# Patient Record
Sex: Female | Born: 1935 | Race: White | Hispanic: No | Marital: Married | Smoking: Former smoker
Health system: Southern US, Community
[De-identification: ages and names within clinical notes are randomized; demographics above are authoritative.]

## PROBLEM LIST (undated history)

## (undated) DIAGNOSIS — I10 Essential (primary) hypertension: Secondary | ICD-10-CM

## (undated) DIAGNOSIS — R0602 Shortness of breath: Secondary | ICD-10-CM

## (undated) DIAGNOSIS — Z8719 Personal history of other diseases of the digestive system: Secondary | ICD-10-CM

## (undated) DIAGNOSIS — F419 Anxiety disorder, unspecified: Secondary | ICD-10-CM

## (undated) DIAGNOSIS — K219 Gastro-esophageal reflux disease without esophagitis: Secondary | ICD-10-CM

## (undated) DIAGNOSIS — K222 Esophageal obstruction: Secondary | ICD-10-CM

## (undated) DIAGNOSIS — J45909 Unspecified asthma, uncomplicated: Secondary | ICD-10-CM

## (undated) DIAGNOSIS — G43909 Migraine, unspecified, not intractable, without status migrainosus: Secondary | ICD-10-CM

## (undated) DIAGNOSIS — C44511 Basal cell carcinoma of skin of breast: Secondary | ICD-10-CM

## (undated) DIAGNOSIS — M810 Age-related osteoporosis without current pathological fracture: Secondary | ICD-10-CM

## (undated) DIAGNOSIS — J449 Chronic obstructive pulmonary disease, unspecified: Secondary | ICD-10-CM

---

## 1969-06-27 HISTORY — PX: URETHRAL DIVERTICULUM REPAIR: SHX5148

## 1979-06-28 HISTORY — PX: DILATION AND CURETTAGE OF UTERUS: SHX78

## 1979-06-28 HISTORY — PX: ABDOMINAL HYSTERECTOMY: SHX81

## 1989-06-27 HISTORY — PX: CATARACT EXTRACTION W/ INTRAOCULAR LENS  IMPLANT, BILATERAL: SHX1307

## 1998-11-07 ENCOUNTER — Ambulatory Visit (HOSPITAL_COMMUNITY): Admission: RE | Admit: 1998-11-07 | Discharge: 1998-11-07 | Payer: Self-pay | Admitting: Family Medicine

## 2002-03-10 ENCOUNTER — Emergency Department (HOSPITAL_COMMUNITY): Admission: EM | Admit: 2002-03-10 | Discharge: 2002-03-11 | Payer: Self-pay | Admitting: Emergency Medicine

## 2002-03-10 ENCOUNTER — Encounter: Payer: Self-pay | Admitting: Emergency Medicine

## 2002-08-10 ENCOUNTER — Encounter: Payer: Self-pay | Admitting: Emergency Medicine

## 2002-08-10 ENCOUNTER — Emergency Department (HOSPITAL_COMMUNITY): Admission: EM | Admit: 2002-08-10 | Discharge: 2002-08-10 | Payer: Self-pay | Admitting: *Deleted

## 2005-08-26 ENCOUNTER — Encounter: Admission: RE | Admit: 2005-08-26 | Discharge: 2005-08-26 | Payer: Self-pay | Admitting: Family Medicine

## 2005-09-02 ENCOUNTER — Encounter: Admission: RE | Admit: 2005-09-02 | Discharge: 2005-09-02 | Payer: Self-pay | Admitting: Family Medicine

## 2005-12-19 ENCOUNTER — Emergency Department (HOSPITAL_COMMUNITY): Admission: EM | Admit: 2005-12-19 | Discharge: 2005-12-19 | Payer: Self-pay | Admitting: Emergency Medicine

## 2006-12-17 ENCOUNTER — Encounter: Admission: RE | Admit: 2006-12-17 | Discharge: 2006-12-17 | Payer: Self-pay | Admitting: Family Medicine

## 2007-08-07 ENCOUNTER — Emergency Department (HOSPITAL_COMMUNITY): Admission: EM | Admit: 2007-08-07 | Discharge: 2007-08-07 | Payer: Self-pay | Admitting: Emergency Medicine

## 2007-08-08 ENCOUNTER — Emergency Department (HOSPITAL_COMMUNITY): Admission: EM | Admit: 2007-08-08 | Discharge: 2007-08-08 | Payer: Self-pay | Admitting: Emergency Medicine

## 2008-01-12 ENCOUNTER — Encounter: Admission: RE | Admit: 2008-01-12 | Discharge: 2008-01-12 | Payer: Self-pay | Admitting: Family Medicine

## 2009-01-16 ENCOUNTER — Encounter: Admission: RE | Admit: 2009-01-16 | Discharge: 2009-01-16 | Payer: Self-pay | Admitting: Family Medicine

## 2010-03-04 ENCOUNTER — Encounter: Admission: RE | Admit: 2010-03-04 | Discharge: 2010-03-04 | Payer: Self-pay | Admitting: *Deleted

## 2010-10-06 ENCOUNTER — Emergency Department (HOSPITAL_COMMUNITY)
Admission: EM | Admit: 2010-10-06 | Discharge: 2010-10-06 | Payer: Self-pay | Source: Home / Self Care | Admitting: Emergency Medicine

## 2010-10-27 HISTORY — PX: ESOPHAGEAL DILATION: SHX303

## 2010-11-16 ENCOUNTER — Encounter: Payer: Self-pay | Admitting: Family Medicine

## 2011-06-27 ENCOUNTER — Other Ambulatory Visit: Payer: Self-pay | Admitting: Family Medicine

## 2011-07-01 ENCOUNTER — Ambulatory Visit
Admission: RE | Admit: 2011-07-01 | Discharge: 2011-07-01 | Disposition: A | Discharge status: Home / Self Care | Payer: Medicare Other | Source: Ambulatory Visit | Attending: Family Medicine | Admitting: Family Medicine

## 2011-08-01 ENCOUNTER — Other Ambulatory Visit: Payer: Self-pay | Admitting: Family Medicine

## 2011-08-01 DIAGNOSIS — Z1231 Encounter for screening mammogram for malignant neoplasm of breast: Secondary | ICD-10-CM

## 2011-08-07 LAB — CBC
HCT: 37.6
Hemoglobin: 12.8
MCHC: 34
MCV: 88.1
RDW: 13.3

## 2011-08-07 LAB — DIFFERENTIAL
Basophils Relative: 1
Monocytes Relative: 5
Neutrophils Relative %: 71

## 2011-08-12 ENCOUNTER — Ambulatory Visit
Admission: RE | Admit: 2011-08-12 | Discharge: 2011-08-12 | Disposition: A | Discharge status: Home / Self Care | Payer: Medicare Other | Source: Ambulatory Visit | Attending: Family Medicine | Admitting: Family Medicine

## 2011-08-12 DIAGNOSIS — Z1231 Encounter for screening mammogram for malignant neoplasm of breast: Secondary | ICD-10-CM

## 2013-06-06 ENCOUNTER — Ambulatory Visit
Admission: RE | Admit: 2013-06-06 | Discharge: 2013-06-06 | Disposition: A | Discharge status: Home / Self Care | Payer: Medicare Other | Source: Ambulatory Visit | Attending: Family Medicine | Admitting: Family Medicine

## 2013-06-06 ENCOUNTER — Other Ambulatory Visit: Payer: Self-pay | Admitting: Family Medicine

## 2013-06-06 DIAGNOSIS — R0600 Dyspnea, unspecified: Secondary | ICD-10-CM

## 2013-06-07 ENCOUNTER — Inpatient Hospital Stay (HOSPITAL_COMMUNITY)
Admission: EM | Admit: 2013-06-07 | Discharge: 2013-06-09 | DRG: 191 | Disposition: A | Discharge status: Home / Self Care | Payer: Medicare Other | Attending: Internal Medicine | Admitting: Internal Medicine

## 2013-06-07 ENCOUNTER — Encounter (HOSPITAL_COMMUNITY): Payer: Self-pay | Admitting: *Deleted

## 2013-06-07 DIAGNOSIS — T380X5A Adverse effect of glucocorticoids and synthetic analogues, initial encounter: Secondary | ICD-10-CM | POA: Diagnosis present

## 2013-06-07 DIAGNOSIS — J209 Acute bronchitis, unspecified: Secondary | ICD-10-CM | POA: Diagnosis present

## 2013-06-07 DIAGNOSIS — R0602 Shortness of breath: Secondary | ICD-10-CM | POA: Diagnosis present

## 2013-06-07 DIAGNOSIS — Z87891 Personal history of nicotine dependence: Secondary | ICD-10-CM

## 2013-06-07 DIAGNOSIS — J441 Chronic obstructive pulmonary disease with (acute) exacerbation: Principal | ICD-10-CM | POA: Diagnosis present

## 2013-06-07 DIAGNOSIS — J440 Chronic obstructive pulmonary disease with acute lower respiratory infection: Secondary | ICD-10-CM | POA: Diagnosis present

## 2013-06-07 DIAGNOSIS — R7309 Other abnormal glucose: Secondary | ICD-10-CM | POA: Diagnosis present

## 2013-06-07 DIAGNOSIS — J4521 Mild intermittent asthma with (acute) exacerbation: Secondary | ICD-10-CM

## 2013-06-07 DIAGNOSIS — G43909 Migraine, unspecified, not intractable, without status migrainosus: Secondary | ICD-10-CM | POA: Diagnosis present

## 2013-06-07 DIAGNOSIS — K219 Gastro-esophageal reflux disease without esophagitis: Secondary | ICD-10-CM | POA: Diagnosis present

## 2013-06-07 DIAGNOSIS — J45909 Unspecified asthma, uncomplicated: Secondary | ICD-10-CM | POA: Diagnosis present

## 2013-06-07 DIAGNOSIS — I10 Essential (primary) hypertension: Secondary | ICD-10-CM | POA: Diagnosis present

## 2013-06-07 DIAGNOSIS — J45901 Unspecified asthma with (acute) exacerbation: Secondary | ICD-10-CM

## 2013-06-07 HISTORY — DX: Basal cell carcinoma of skin of breast: C44.511

## 2013-06-07 HISTORY — DX: Migraine, unspecified, not intractable, without status migrainosus: G43.909

## 2013-06-07 HISTORY — DX: Unspecified asthma, uncomplicated: J45.909

## 2013-06-07 HISTORY — DX: Gastro-esophageal reflux disease without esophagitis: K21.9

## 2013-06-07 HISTORY — DX: Shortness of breath: R06.02

## 2013-06-07 HISTORY — DX: Essential (primary) hypertension: I10

## 2013-06-07 HISTORY — DX: Esophageal obstruction: K22.2

## 2013-06-07 HISTORY — DX: Personal history of other diseases of the digestive system: Z87.19

## 2013-06-07 LAB — GLUCOSE, CAPILLARY
Glucose-Capillary: 218 mg/dL — ABNORMAL HIGH (ref 70–99)
Glucose-Capillary: 183 mg/dL — ABNORMAL HIGH (ref 70–99)
Glucose-Capillary: 121 mg/dL — ABNORMAL HIGH (ref 70–99)

## 2013-06-07 LAB — BASIC METABOLIC PANEL
Calcium: 9.3 mg/dL (ref 8.4–10.5)
Chloride: 101 mEq/L (ref 96–112)
Creatinine, Ser: 0.79 mg/dL (ref 0.50–1.10)
GFR calc Af Amer: >90 mL/min (ref >90)
GFR calc non Af Amer: 79 mL/min — ABNORMAL LOW (ref >90)
Potassium: 3.1 mEq/L — ABNORMAL LOW (ref 3.5–5.1)
Sodium: 141 mEq/L (ref 135–145)

## 2013-06-07 LAB — CBC
HCT: 38 % (ref 36.0–46.0)
Hemoglobin: 13.1 g/dL (ref 12.0–15.0)
MCH: 30.4 pg (ref 26.0–34.0)
MCV: 88.2 fL (ref 78.0–100.0)
RBC: 4.31 MIL/uL (ref 3.87–5.11)

## 2013-06-07 LAB — CBC WITH DIFFERENTIAL/PLATELET
Basophils Absolute: 0 10*3/uL (ref 0.0–0.1)
Basophils Relative: 0 % (ref 0–1)
Hemoglobin: 13.5 g/dL (ref 12.0–15.0)
Lymphocytes Relative: 5 % — ABNORMAL LOW (ref 12–46)
Lymphs Abs: 0.4 10*3/uL — ABNORMAL LOW (ref 0.7–4.0)
MCHC: 34.3 g/dL (ref 30.0–36.0)
MCV: 88.7 fL (ref 78.0–100.0)
Monocytes Relative: 1 % — ABNORMAL LOW (ref 3–12)
Neutro Abs: 7.5 10*3/uL (ref 1.7–7.7)
Neutrophils Relative %: 94 % — ABNORMAL HIGH (ref 43–77)
Platelets: 179 10*3/uL (ref 150–400)
RDW: 13.7 % (ref 11.5–15.5)

## 2013-06-07 LAB — HEMOGLOBIN A1C
Hgb A1c MFr Bld: 5.7 % — ABNORMAL HIGH (ref <5.7)
Mean Plasma Glucose: 117 mg/dL — ABNORMAL HIGH (ref <117)

## 2013-06-07 LAB — POCT I-STAT TROPONIN I

## 2013-06-07 MED ORDER — ALBUTEROL SULFATE (5 MG/ML) 0.5% IN NEBU
5.0000 mg | INHALATION_SOLUTION | Freq: Once | RESPIRATORY_TRACT | Status: AC
  Administered 2013-06-07: 5 mg via RESPIRATORY_TRACT
  Filled 2013-06-07: qty 1

## 2013-06-07 MED ORDER — METHYLPREDNISOLONE SODIUM SUCC 40 MG IJ SOLR
40.0000 mg | INTRAMUSCULAR | Status: DC
  Administered 2013-06-07: 40 mg via INTRAVENOUS
  Filled 2013-06-07 (×3): qty 1

## 2013-06-07 MED ORDER — INSULIN ASPART 100 UNIT/ML ~~LOC~~ SOLN
0.0000 [IU] | Freq: Three times a day (TID) | SUBCUTANEOUS | Status: DC
  Administered 2013-06-07: 1 [IU] via SUBCUTANEOUS
  Administered 2013-06-07: 2 [IU] via SUBCUTANEOUS
  Administered 2013-06-09: 1 [IU] via SUBCUTANEOUS
  Administered 2013-06-09: 2 [IU] via SUBCUTANEOUS

## 2013-06-07 MED ORDER — ALBUTEROL SULFATE (5 MG/ML) 0.5% IN NEBU
2.5000 mg | INHALATION_SOLUTION | Freq: Four times a day (QID) | RESPIRATORY_TRACT | Status: DC
  Administered 2013-06-07 – 2013-06-09 (×8): 2.5 mg via RESPIRATORY_TRACT
  Filled 2013-06-07 (×8): qty 0.5

## 2013-06-07 MED ORDER — ONDANSETRON HCL 4 MG PO TABS: 4.0000 mg | ORAL_TABLET | Freq: Four times a day (QID) | ORAL | Status: DC | PRN

## 2013-06-07 MED ORDER — ACETAMINOPHEN 650 MG RE SUPP: 650.0000 mg | Freq: Four times a day (QID) | RECTAL | Status: DC | PRN

## 2013-06-07 MED ORDER — SODIUM CHLORIDE 0.9 % IJ SOLN
3.0000 mL | Freq: Two times a day (BID) | INTRAMUSCULAR | Status: DC
  Administered 2013-06-07 – 2013-06-09 (×4): 3 mL via INTRAVENOUS

## 2013-06-07 MED ORDER — ACETAMINOPHEN 325 MG PO TABS: 650.0000 mg | ORAL_TABLET | Freq: Four times a day (QID) | ORAL | Status: DC | PRN

## 2013-06-07 MED ORDER — METOPROLOL TARTRATE 50 MG PO TABS
50.0000 mg | ORAL_TABLET | Freq: Every day | ORAL | Status: DC
  Administered 2013-06-07 – 2013-06-09 (×3): 50 mg via ORAL
  Filled 2013-06-07 (×3): qty 1

## 2013-06-07 MED ORDER — HYDROCHLOROTHIAZIDE 25 MG PO TABS
25.0000 mg | ORAL_TABLET | Freq: Every day | ORAL | Status: DC
  Administered 2013-06-07 – 2013-06-09 (×3): 25 mg via ORAL
  Filled 2013-06-07 (×3): qty 1

## 2013-06-07 MED ORDER — ALBUTEROL SULFATE (5 MG/ML) 0.5% IN NEBU
2.5000 mg | INHALATION_SOLUTION | RESPIRATORY_TRACT | Status: DC | PRN
  Administered 2013-06-07: 2.5 mg via RESPIRATORY_TRACT
  Filled 2013-06-07: qty 0.5

## 2013-06-07 MED ORDER — BUDESONIDE 0.25 MG/2ML IN SUSP
0.2500 mg | Freq: Two times a day (BID) | RESPIRATORY_TRACT | Status: DC
  Administered 2013-06-07 – 2013-06-09 (×4): 0.25 mg via RESPIRATORY_TRACT
  Filled 2013-06-07 (×7): qty 2

## 2013-06-07 MED ORDER — AZITHROMYCIN 250 MG PO TABS
500.0000 mg | ORAL_TABLET | Freq: Once | ORAL | Status: AC
  Administered 2013-06-07: 500 mg via ORAL
  Filled 2013-06-07: qty 2

## 2013-06-07 MED ORDER — LEVOFLOXACIN IN D5W 750 MG/150ML IV SOLN
750.0000 mg | INTRAVENOUS | Status: DC
  Administered 2013-06-07 – 2013-06-08 (×3): 750 mg via INTRAVENOUS
  Filled 2013-06-07 (×3): qty 150

## 2013-06-07 MED ORDER — ONDANSETRON HCL 4 MG/2ML IJ SOLN: 4.0000 mg | Freq: Four times a day (QID) | INTRAMUSCULAR | Status: DC | PRN

## 2013-06-07 MED ORDER — ENOXAPARIN SODIUM 40 MG/0.4ML ~~LOC~~ SOLN
40.0000 mg | SUBCUTANEOUS | Status: DC
  Administered 2013-06-07 – 2013-06-09 (×3): 40 mg via SUBCUTANEOUS
  Filled 2013-06-07 (×4): qty 0.4

## 2013-06-07 MED ORDER — MAGNESIUM SULFATE 40 MG/ML IJ SOLN
2.0000 g | Freq: Once | INTRAMUSCULAR | Status: AC
  Administered 2013-06-07: 2 g via INTRAVENOUS
  Filled 2013-06-07: qty 50

## 2013-06-07 MED ORDER — SODIUM CHLORIDE 0.9 % IJ SOLN: 3.0000 mL | Freq: Two times a day (BID) | INTRAMUSCULAR | Status: DC

## 2013-06-07 NOTE — Progress Notes (Signed)
Utilization Review Completed.   Emaree Chiu, RN, BSN Nurse Case Manager  [phone]  

## 2013-06-07 NOTE — ED Notes (Signed)
Pt noted to have O2 saturation of 87% on RA.  Pt place don 2lpm O2 via [place].

## 2013-06-07 NOTE — ED Notes (Addendum)
Per EMS, pt called EMS around 2000 tonight and received one albuterol treatment and refused transport.  Pt called a second time and received one duoned, 125 solumedrol.  Ems observed room o2 sat of 79%.

## 2013-06-07 NOTE — ED Notes (Signed)
Pt placed on room air and O2 saturation noted to have dropped to 87%.  Pt placed back on O2 via [place] at 3lpm

## 2013-06-07 NOTE — Progress Notes (Signed)
ANTIBIOTIC CONSULT NOTE - INITIAL  Pharmacy Consult for levaquin Indication: pneumonia  Allergies  Allergen Reactions  . Spiriva (Tiotropium Bromide Monohydrate) Itching    Patient Measurements: Height: 5\' 7"  (170.2 cm) Weight: 189 lb 9.5 oz (86 kg) (scale C) IBW/kg (Calculated) : 61.6   Vital Signs: Temp: 97.7 F (36.5 C) (08/12 0718) Temp src: Oral (08/12 0718) BP: 121/53 mmHg (08/12 0718) Pulse Rate: 94 (08/12 0718) Intake/Output from previous day:   Intake/Output from this shift:    Labs:  Recent Labs  06/07/13 0324  WBC 8.0  HGB 13.5  PLT 179  CREATININE 0.79   Estimated Creatinine Clearance: 68.5 ml/min (by C-G formula based on Cr of 0.79). No results found for this basename: VANCOTROUGH, VANCOPEAK, VANCORANDOM, GENTTROUGH, GENTPEAK, GENTRANDOM, TOBRATROUGH, TOBRAPEAK, TOBRARND, AMIKACINPEAK, AMIKACINTROU, AMIKACIN,  in the last 72 hours   Microbiology: No results found for this or any previous visit (from the past 720 hour(s)).  Medical History: Past Medical History  Diagnosis Date  . Hypertension   . Asthma     Medications:  Prescriptions prior to admission  Medication Sig Dispense Refill  . doxycycline (VIBRA-TABS) 100 MG tablet Take 100 mg by mouth daily. For 10 days. Start 8.11.14 END 8.21.14      . hydrochlorothiazide (HYDRODIURIL) 25 MG tablet Take 25 mg by mouth daily.      . metoprolol (LOPRESSOR) 50 MG tablet Take 50 mg by mouth daily.      . predniSONE (DELTASONE) 20 MG tablet Take 20 mg by mouth daily. For 7 days. START 8.11.14 END 8.18.14       Assessment: 77 yo lady to start levaquin for COPD exacerbation/r/o PNA.  Her CrCl ~ 68 ml/min.  WBC 8.0  Goal of Therapy:  Eradication of infection  Plan:  Levaquin 750 mg IV q24 hours. F/u renal function, clinical course and cultures.  Child Campoy Poteet 06/07/2013,7:41 AM

## 2013-06-07 NOTE — ED Provider Notes (Signed)
CSN: [id number]     Arrival date & time 06/07/13  0139 History     First MD Initiated Contact with Patient 06/07/13 0231     Chief Complaint  Patient presents with  . Shortness of Breath   HPI patient has history of asthma, she is a remote smoking history, and a few days of worsening shortness of breath, start her primary care physician today received prednisone and doxycycline. She's taken one dose of the prednisone. Per EMS, she called 911 at 8:00, that an albuterol treatment felt better and refused transport. EMS was called a second time, he had room air sats of 79% she received 125 of Solu-Medrol and a duo neb. She says her shortness of breath is severe, is been worsening, no fevers or chills, productive cough. No associated chest pain, nausea vomiting or diarrhea  Past Medical History  Diagnosis Date  . Hypertension   . Asthma    Past Surgical History  Procedure Laterality Date  . Abdominal hysterectomy     Family History  Problem Relation Age of Onset  . Cancer Mother   . Stroke Mother   . Hypertension Mother    History  Substance Use Topics  . Smoking status: Former Smoker    Quit date: 10/27/1968  . Smokeless tobacco: Never Used  . Alcohol Use: 0.6 oz/week    1 Glasses of wine per week   OB History   Grav Para Term Preterm Abortions TAB SAB Ect Mult Living   2 2 2             Review of Systems  Allergies  Spiriva  Home Medications   Current Outpatient Rx  Name  Route  Sig  Dispense  Refill  . doxycycline (VIBRA-TABS) 100 MG tablet   Oral   Take 100 mg by mouth daily. For 10 days. Start 8.11.14 END 8.21.14         . hydrochlorothiazide (HYDRODIURIL) 25 MG tablet   Oral   Take 25 mg by mouth daily.         . metoprolol (LOPRESSOR) 50 MG tablet   Oral   Take 50 mg by mouth daily.         . predniSONE (DELTASONE) 20 MG tablet   Oral   Take 20 mg by mouth daily. For 7 days. START 8.11.14 END 8.18.14          BP 136/70  Pulse 104  SpO2  92% Physical Exam  Nursing notes reviewed.  Electronic medical record reviewed. VITAL SIGNS:   Filed Vitals:   06/07/13 0300 06/07/13 0326 06/07/13 0330 06/07/13 0345  BP: 128/78  138/76 136/70  Pulse: 91  92 104  SpO2: 92% 90% 91% 92%   CONSTITUTIONAL: Awake, oriented, appears non-toxic HENT: Atraumatic, normocephalic, oral mucosa pink and moist, airway patent. Nares patent without drainage. External ears normal. EYES: Conjunctiva clear, EOMI, PERRLA NECK: Trachea midline, non-tender, supple CARDIOVASCULAR: Normal heart rate, Normal rhythm, No murmurs, rubs, gallops PULMONARY/CHEST: Poor air movement bilaterally, wheezing bilaterally. Non-tender. ABDOMINAL: Non-distended, soft, non-tender - no rebound or guarding.  BS normal. NEUROLOGIC: Non-focal, moving all four extremities, no gross sensory or motor deficits. EXTREMITIES: No clubbing, cyanosis, or edema SKIN: Warm, Dry, No erythema, No rash  ED Course   Procedures (including critical care time)  Labs Reviewed  CBC WITH DIFFERENTIAL - Abnormal; Notable for the following:    Neutrophils Relative % 94 (*)    Lymphocytes Relative 5 (*)    Lymphs Abs 0.4 (*)  Monocytes Relative 1 (*)    All other components within normal limits  BASIC METABOLIC PANEL - Abnormal; Notable for the following:    Potassium 3.1 (*)    Glucose, Bld 225 (*)    GFR calc non Af Amer 79 (*)    All other components within normal limits  POCT I-STAT TROPONIN I   Dg Chest 2 View  06/06/2013   *RADIOLOGY REPORT*  Clinical Data: Cough and shortness of breath  CHEST - 2 VIEW  Comparison: 01/11/2012  Findings: The cardiac silhouette is normal in size and configuration.  The mediastinum is normal in contour caliber. There are no hilar masses.  The lungs are clear.  No pleural effusion or pneumothorax.  The bony thorax is demineralized but intact.  IMPRESSION: No acute cardiopulmonary disease.  No change from the prior study.   Original Report Authenticated By:  Amie Portland, M.D.   1. COPD exacerbation   2. HTN (hypertension)   3. SOB (shortness of breath)   4. Asthmatic bronchitis, mild intermittent, with acute exacerbation     MDM  Acadia Verhagen is a 77 y.o. female presenting with likely COPD exacerbation/asthma exacerbation. She does have remote smoking history.  Patient treated with multiple rounds of albuterol, patient failed to respond enough to go home. Patient's room air oxygen saturation is 87% when she is not moving.  Patient treated with albuterol, she gets there and the EMS and given magnesium, she is improved I will require admission. Discussed with Dr. Toniann Fail for admission admitted in stable condition   Jones Skene, MD 06/07/13 [phone]

## 2013-06-07 NOTE — H&P (Signed)
[facility] History and Physical  Kelly Delgado WUJ:[id number] DOB: 1936/02/21 DOA: 06/07/2013  Referring physician: ER physician. PCP: Irving Copas, MD   Chief Complaint: Shortness of breath.  HPI: Kelly Delgado is a 77 y.o. female with history of hypertension was on her way back from [place] on Saturday 2 days ago when patient started developing cough. Eventually patient started developing wheezing and yesterday had gone to her PCP who had given patient doxycycline and steroids. Despite taking which patient was still feeling short of breath and had presented to the ER. In the ER on exam patient was found to be wheezing and chest x-ray does not show any definite infiltrates. Patient has been admitted for further management. Patient denies any chest pain nausea vomiting palpitations diaphoresis abdominal pain diarrhea.  Review of Systems: As presented in the history of presenting illness, rest negative.  Past Medical History  Diagnosis Date  . Hypertension   . Asthma    Past Surgical History  Procedure Laterality Date  . Abdominal hysterectomy     Social History:  reports that she quit smoking about 44 years ago. She has never used smokeless tobacco. She reports that she drinks about 0.6 ounces of alcohol per week. She reports that she does not use illicit drugs. Home. where does patient live-- Can do ADLs. Can patient participate in ADLs?  Allergies  Allergen Reactions  . Spiriva (Tiotropium Bromide Monohydrate) Itching    Family History  Problem Relation Age of Onset  . Cancer Mother   . Stroke Mother   . Hypertension Mother       Prior to Admission medications   Medication Sig Start Date End Date Taking? Authorizing Provider  doxycycline (VIBRA-TABS) 100 MG tablet Take 100 mg by mouth daily. For 10 days. Start 8.11.14 END 8.21.14   Yes Historical Provider, MD  hydrochlorothiazide (HYDRODIURIL) 25 MG tablet Take 25 mg by mouth daily.   Yes Historical  Provider, MD  metoprolol (LOPRESSOR) 50 MG tablet Take 50 mg by mouth daily.   Yes Historical Provider, MD  predniSONE (DELTASONE) 20 MG tablet Take 20 mg by mouth daily. For 7 days. START 8.11.14 END 8.18.14   Yes Historical Provider, MD   Physical Exam: Filed Vitals:   06/07/13 0300 06/07/13 0326 06/07/13 0330 06/07/13 0345  BP: 128/78  138/76 136/70  Pulse: 91  92 104  SpO2: 92% 90% 91% 92%     General:  Well-developed and nourished.  Eyes: Anicteric no pallor.  ENT: No discharge from the ears eyes nose mouth.  Neck: No mass felt.  Cardiovascular: S1-S2 heard.  Respiratory: Bilateral expiratory wheeze heard no crepitations.  Abdomen: Soft nontender bowel sounds present.  Skin: No rash.  Musculoskeletal: No edema.  Psychiatric: Appears normal.  Neurologic: Alert awake oriented to time place and person. Moves all extremities.  Labs on Admission:  Basic Metabolic Panel:  Recent Labs Lab 06/07/13 0324  NA 141  K 3.1*  CL 101  CO2 25  GLUCOSE 225*  BUN 20  CREATININE 0.79  CALCIUM 9.3   Liver Function Tests: No results found for this basename: AST, ALT, ALKPHOS, BILITOT, PROT, ALBUMIN,  in the last 168 hours No results found for this basename: LIPASE, AMYLASE,  in the last 168 hours No results found for this basename: AMMONIA,  in the last 168 hours CBC:  Recent Labs Lab 06/07/13 0324  WBC 8.0  NEUTROABS 7.5  HGB 13.5  HCT 39.4  MCV 88.7  PLT 179   Cardiac Enzymes:  No results found for this basename: CKTOTAL, CKMB, CKMBINDEX, TROPONINI,  in the last 168 hours  BNP (last 3 results) No results found for this basename: PROBNP,  in the last 8760 hours CBG: No results found for this basename: GLUCAP,  in the last 168 hours  Radiological Exams on Admission: Dg Chest 2 View  06/06/2013   *RADIOLOGY REPORT*  Clinical Data: Cough and shortness of breath  CHEST - 2 VIEW  Comparison: 01/11/2012  Findings: The cardiac silhouette is normal in size and  configuration.  The mediastinum is normal in contour caliber. There are no hilar masses.  The lungs are clear.  No pleural effusion or pneumothorax.  The bony thorax is demineralized but intact.  IMPRESSION: No acute cardiopulmonary disease.  No change from the prior study.   Original Report Authenticated By: Amie Portland, M.D.     Assessment/Plan Principal Problem:   SOB (shortness of breath) Active Problems:   Asthmatic bronchitis   HTN (hypertension)   1. Shortness of breath most likely secondary to asthmatic bronchitis - continue with nebulizer Pulmicort IV steroids and antibiotics. 2. Hypoglycemia - probably secondary to steroids. Place patient on CBG checks with sliding scale coverage and check hemoglobin A1c. 3. Hypertension - continue present medications.    Code Status: Full code.  Family Communication: Patient's husband at the bedside.  Disposition Plan: Admit to inpatient.    KAKRAKANDY,ARSHAD N. [facility] Pager [phone].  If 7PM-7AM, please contact night-coverage [email or web address] Password [facility] 06/07/2013, 6:13 AM

## 2013-06-07 NOTE — ED Notes (Signed)
Ambulated pt in hallway. O2 saturation dropped to 87%

## 2013-06-07 NOTE — ED Notes (Addendum)
Pt arrived via EMS due to SOB. Pt states that her dr was checking to see if she had pneumonia and had a chest xray done yesterday (06/06/13)

## 2013-06-07 NOTE — Progress Notes (Signed)
Pt was seen and examined.  H&P by Dr. Toniann Fail was reviewed.  Orders reviewed.  Continue current orders.  Rodney Langton, MD, CDE, FAAFP [facility] [place], [place]

## 2013-06-08 DIAGNOSIS — J45909 Unspecified asthma, uncomplicated: Secondary | ICD-10-CM

## 2013-06-08 LAB — BASIC METABOLIC PANEL
BUN: 25 mg/dL — ABNORMAL HIGH (ref 6–23)
Calcium: 9.2 mg/dL (ref 8.4–10.5)
Creatinine, Ser: 0.86 mg/dL (ref 0.50–1.10)
GFR calc Af Amer: 75 mL/min — ABNORMAL LOW (ref >90)
GFR calc non Af Amer: 64 mL/min — ABNORMAL LOW (ref >90)
Glucose, Bld: 113 mg/dL — ABNORMAL HIGH (ref 70–99)

## 2013-06-08 LAB — CBC
Hemoglobin: 12.5 g/dL (ref 12.0–15.0)
MCH: 30.3 pg (ref 26.0–34.0)
MCHC: 34.2 g/dL (ref 30.0–36.0)
RDW: 14 % (ref 11.5–15.5)

## 2013-06-08 LAB — GLUCOSE, CAPILLARY: Glucose-Capillary: 82 mg/dL (ref 70–99)

## 2013-06-08 MED ORDER — METHYLPREDNISOLONE SODIUM SUCC 40 MG IJ SOLR
40.0000 mg | Freq: Three times a day (TID) | INTRAMUSCULAR | Status: DC
  Administered 2013-06-08 – 2013-06-09 (×2): 40 mg via INTRAVENOUS
  Filled 2013-06-08 (×7): qty 1

## 2013-06-08 NOTE — Progress Notes (Signed)
Patient A/Ox4 and is ambulatory with standby assist. She is currently on 2 L [place] of oxygen. She has no c/o pain and no signs of distress. She is resting in bed.

## 2013-06-08 NOTE — Progress Notes (Signed)
Patient given incentive spirometer to improve lung function and instructed to do one set of 10 per hour.  Pt demonstrated and understands use.

## 2013-06-08 NOTE — Progress Notes (Signed)
[facility] Progress Note  06/08/2013   Subjective: Pt reports that she is improving overall but still coughing and wheezing and SOB with ambulation.  No chest pain.  Pt still on 2liters of oxygen [place].   Objective:  Vital signs in last 24 hours: Filed Vitals:   06/07/13 2043 06/07/13 2059 06/08/13 0217 06/08/13 0451  BP: 146/70   143/75  Pulse: 72   62  Temp: 97.6 F (36.4 C)   97.7 F (36.5 C)  TempSrc: Oral   Oral  Resp: 18   18  Height:      Weight:    85.6 kg (188 lb 11.4 oz)  SpO2: 97% 97% 97% 94%   Weight change:   Intake/Output Summary (Last 24 hours) at 06/08/13 0865 Last data filed at 06/07/13 2135  Gross per 24 hour  Intake    783 ml  Output   1200 ml  Net   -417 ml   Lab Results  Component Value Date   HGBA1C 5.7* 06/07/2013   Lab Results  Component Value Date   CREATININE 0.86 06/08/2013    Review of Systems As above, otherwise all reviewed and reported negative  Physical Exam General - awake, no distress, cooperative HEENT - NCAT, MMM Lungs - BBS with diffuse insp/exp wheezing CV - normal s1, s2 sounds Abd - soft, nondistended, no masses, nontender Ext - no C/C/E  Lab Results: Results for orders placed during the hospital encounter of 06/07/13 (from the past 24 hour(s))  CBC     Status: None   Collection Time    06/07/13  7:55 AM      Result Value Range   WBC 8.2  4.0 - 10.5 K/uL   RBC 4.31  3.87 - 5.11 MIL/uL   Hemoglobin 13.1  12.0 - 15.0 g/dL   HCT 78.4  69.6 - 29.5 %   MCV 88.2  78.0 - 100.0 fL   MCH 30.4  26.0 - 34.0 pg   MCHC 34.5  30.0 - 36.0 g/dL   RDW 28.4  13.2 - 44.0 %   Platelets 190  150 - 400 K/uL  CREATININE, SERUM     Status: Abnormal   Collection Time    06/07/13  7:55 AM      Result Value Range   Creatinine, Ser 0.77  0.50 - 1.10 mg/dL   GFR calc non Af Amer 80 (*) >90 mL/min   GFR calc Af Amer >90  >90 mL/min  HEMOGLOBIN A1C     Status: Abnormal   Collection Time    06/07/13  7:55 AM      Result Value  Range   Hemoglobin A1C 5.7 (*) <5.7 %   Mean Plasma Glucose 117 (*) <117 mg/dL  GLUCOSE, CAPILLARY     Status: Abnormal   Collection Time    06/07/13  8:26 AM      Result Value Range   Glucose-Capillary 218 (*) 70 - 99 mg/dL   Comment 1 Notify RN    GLUCOSE, CAPILLARY     Status: Abnormal   Collection Time    06/07/13 11:10 AM      Result Value Range   Glucose-Capillary 183 (*) 70 - 99 mg/dL   Comment 1 Notify RN    GLUCOSE, CAPILLARY     Status: Abnormal   Collection Time    06/07/13  4:03 PM      Result Value Range   Glucose-Capillary 123 (*) 70 - 99 mg/dL  GLUCOSE, CAPILLARY  Status: Abnormal   Collection Time    06/07/13  9:00 PM      Result Value Range   Glucose-Capillary 121 (*) 70 - 99 mg/dL   Comment 1 Notify RN    BASIC METABOLIC PANEL     Status: Abnormal   Collection Time    06/08/13  4:00 AM      Result Value Range   Sodium 142  135 - 145 mEq/L   Potassium 3.4 (*) 3.5 - 5.1 mEq/L   Chloride 104  96 - 112 mEq/L   CO2 30  19 - 32 mEq/L   Glucose, Bld 113 (*) 70 - 99 mg/dL   BUN 25 (*) 6 - 23 mg/dL   Creatinine, Ser 4.09  0.50 - 1.10 mg/dL   Calcium 9.2  8.4 - 81.1 mg/dL   GFR calc non Af Amer 64 (*) >90 mL/min   GFR calc Af Amer 75 (*) >90 mL/min  CBC     Status: Abnormal   Collection Time    06/08/13  4:00 AM      Result Value Range   WBC 13.8 (*) 4.0 - 10.5 K/uL   RBC 4.12  3.87 - 5.11 MIL/uL   Hemoglobin 12.5  12.0 - 15.0 g/dL   HCT 91.4  78.2 - 95.6 %   MCV 88.8  78.0 - 100.0 fL   MCH 30.3  26.0 - 34.0 pg   MCHC 34.2  30.0 - 36.0 g/dL   RDW 21.3  08.6 - 57.8 %   Platelets 199  150 - 400 K/uL  GLUCOSE, CAPILLARY     Status: None   Collection Time    06/08/13  6:22 AM      Result Value Range   Glucose-Capillary 94  70 - 99 mg/dL   Comment 1 Notify RN      Micro Results: No results found for this or any previous visit (from the past 240 hour(s)).  Medications:  Scheduled Meds: . albuterol  2.5 mg Nebulization Q6H  . budesonide  (PULMICORT) nebulizer solution  0.25 mg Nebulization BID  . enoxaparin (LOVENOX) injection  40 mg Subcutaneous Q24H  . hydrochlorothiazide  25 mg Oral Daily  . insulin aspart  0-9 Units Subcutaneous TID WC  . levofloxacin (LEVAQUIN) IV  750 mg Intravenous Q24H  . methylPREDNISolone (SOLU-MEDROL) injection  40 mg Intravenous Q8H  . metoprolol  50 mg Oral Daily  . sodium chloride  3 mL Intravenous Q12H   Continuous Infusions:  PRN Meds:.acetaminophen, acetaminophen, albuterol, ondansetron (ZOFRAN) IV, ondansetron  Assessment/Plan: Acute Asthmatic Bronchitis  - continue nebs every 6 hours - increase solumedrol to 40 mg IV every 8 hours - continue supplemental oxygen as needed but start weaning - ambulate to chair and ambulate halls today  Hyperglycemia - likely secondary to steroids - Pt has prediabetes as evidenced by an A1c of 5.7% - continue supplemental insulin as needed for high glucose readings  Hypertension - continue home medications - currently BPs have been controlled  Dispo - likely home in next 24 hours if continues to improve   LOS: 1 day   Kelly Delgado 06/08/2013, 7:26 AM  Rodney Langton, MD, CDE, FAAFP [facility] [place], [place]  Digital Pager [phone]

## 2013-06-08 NOTE — Progress Notes (Signed)
IV access put in by IV team infiltrated; IV removed and IV team paged to replace IV.

## 2013-06-09 ENCOUNTER — Inpatient Hospital Stay (HOSPITAL_COMMUNITY): Payer: Medicare Other

## 2013-06-09 DIAGNOSIS — J441 Chronic obstructive pulmonary disease with (acute) exacerbation: Secondary | ICD-10-CM

## 2013-06-09 LAB — GLUCOSE, CAPILLARY
Glucose-Capillary: 132 mg/dL — ABNORMAL HIGH (ref 70–99)
Glucose-Capillary: 171 mg/dL — ABNORMAL HIGH (ref 70–99)

## 2013-06-09 MED ORDER — POTASSIUM CHLORIDE CRYS ER 20 MEQ PO TBCR
40.0000 meq | EXTENDED_RELEASE_TABLET | Freq: Once | ORAL | Status: AC
  Administered 2013-06-09: 40 meq via ORAL
  Filled 2013-06-09: qty 2

## 2013-06-09 MED ORDER — ALBUTEROL SULFATE (2.5 MG/3ML) 0.083% IN NEBU: 2.5000 mg | INHALATION_SOLUTION | Freq: Four times a day (QID) | RESPIRATORY_TRACT | Status: DC | PRN

## 2013-06-09 MED ORDER — FUROSEMIDE 10 MG/ML IJ SOLN
20.0000 mg | Freq: Once | INTRAMUSCULAR | Status: AC
  Administered 2013-06-09: 20 mg via INTRAVENOUS
  Filled 2013-06-09: qty 2

## 2013-06-09 MED ORDER — LEVOFLOXACIN 750 MG PO TABS: 750.0000 mg | ORAL_TABLET | Freq: Every day | ORAL | Status: DC

## 2013-06-09 MED ORDER — LEVOFLOXACIN 750 MG PO TABS
750.0000 mg | ORAL_TABLET | Freq: Every day | ORAL | Status: DC
  Administered 2013-06-09: 750 mg via ORAL
  Filled 2013-06-09: qty 1

## 2013-06-09 MED ORDER — PREDNISONE 5 MG PO TABS: ORAL_TABLET | ORAL | Status: DC

## 2013-06-09 NOTE — Care Management Note (Signed)
    Page 1 of 1   06/09/2013     3:25:50 PM   CARE MANAGEMENT NOTE 06/09/2013  Patient:  Kelly Delgado, Kelly Delgado   Account Number:  [id number]  Date Initiated:  06/09/2013  Documentation initiated by:  Tera Mater  Subjective/Objective Assessment:   77yo female admitted with COPD.     Action/Plan:   discharge planning   Anticipated DC Date:  06/09/2013   Anticipated DC Plan:  HOME/SELF CARE      DC Planning Services  CM consult      PAC Choice  DURABLE MEDICAL EQUIPMENT   Choice offered to / List presented to:  C-1 Patient   DME arranged  NEBULIZER/MEDS      DME agency  [facility]        Status of service:  Completed, signed off Medicare Important Message given?   (If response is "NO", the following Medicare IM given date fields will be blank) Date Medicare IM given:   Date Additional Medicare IM given:    Discharge Disposition:  HOME/SELF CARE  Per UR Regulation:  Reviewed for med. necessity/level of care/duration of stay  If discussed at Long Length of Stay Meetings, dates discussed:    Comments:

## 2013-06-09 NOTE — Discharge Summary (Signed)
[facility]                                                                                   Kelly Delgado, is a 77 y.o. female  DOB 06-Aug-1936  MRN [id number].  Admission date:  06/07/2013  Discharge Date:  06/09/2013  Primary MD  Irving Copas, MD  Admitting Physician  Eduard Clos, MD  Admission Diagnosis  HTN (hypertension) [401.9] SOB (shortness of breath) [786.05] COPD exacerbation [491.21] Asthmatic bronchitis, mild intermittent, with acute exacerbation [493.02]  Discharge Diagnosis     Principal Problem:   SOB (shortness of breath) Active Problems:   Asthmatic bronchitis   HTN (hypertension)      Past Medical History  Diagnosis Date  . Hypertension   . Basal cell carcinoma of breast 1990's?    "left side, burned it off" (06/07/2013)  . Schatzki's ring   . Asthmatic bronchitis   . Shortness of breath     "related to asthmatic bronchitis only" (06/07/2013)  . H/O hiatal hernia   . GERD (gastroesophageal reflux disease)   . Migraines     "once a year usually" (06/07/2013)    Past Surgical History  Procedure Laterality Date  . Abdominal hysterectomy  1980's  . Dilation and curettage of uterus  1980's    "1" (06/07/2013)  . Urethral diverticulum repair  1970's  . Cataract extraction w/ intraocular lens  implant, bilateral Bilateral 1990's  . Esophageal dilation  2012    "just once" (06/07/2013)     Recommendations for primary care physician for things to follow:      Discharge Diagnoses:   Principal Problem:   SOB (shortness of breath) Active Problems:   Asthmatic bronchitis   HTN (hypertension)    Discharge Condition: stable   Diet recommendation: See Discharge Instructions below   Consults     History of present illness and  Hospital Course:     Kindly see H&P for history of present illness and admission details, please review complete Labs, Consult reports and Test reports for all details in brief Kelly Delgado, is  a 77 y.o. female, patient with history of hypertension and asthma who recently went out to [place] on a vacation presented to the hospital with chief complaints of shortness of breath associated with cough and wheezing, she was diagnosed with asthmatic bronchitis and admitted to the hospital on IV steroids along with Levaquin, chest x-ray did not show any acute infiltrate, she initially required some oxygen but has now come off of oxygen and is doing great on room air, her wheezing has almost completely resolved, off note patient has had issues with asthma over the last few years and not before that.  She denied any chest pain orthopnea or PND, had no edema on exam. She is now close to her baseline amplitude in the hallway without any problems, will be discharged on a prednisone taper and few more days of Levaquin, will request one time outpatient followup with pulmonary, we'll request PCP to repeat CBC BMP and a 2 view chest x-ray in a week. She has mild reactionary leukocytosis due to steroid treatment. I will provide her with a nebulizer kit  and a troponin rise or when necessary also.   Her hypertension is stable, she had mild steroid-induced hyperglycemia which will be monitored by PCP upon discharge.    Today   Subjective:   Kelly Delgado today has no headache,no chest abdominal pain,no new weakness tingling or numbness, feels much better wants to go home today.   Objective:   Blood pressure 137/71, pulse 63, temperature 98.1 F (36.7 C), temperature source Oral, resp. rate 18, height 5\' 7"  (1.702 m), weight 85.5 kg (188 lb 7.9 oz), SpO2 93.00%.   Intake/Output Summary (Last 24 hours) at 06/09/13 1019 Last data filed at 06/09/13 0836  Gross per 24 hour  Intake    970 ml  Output   2225 ml  Net  -1255 ml    Exam Awake Alert, Oriented *3, No new F.N deficits, Normal affect Bailey.AT,PERRAL Supple Neck,No JVD, No cervical lymphadenopathy appriciated.  Symmetrical Chest wall movement,  Good air movement bilaterally, few wheezes RRR,No Gallops,Rubs or new Murmurs, No Parasternal Heave +ve B.Sounds, Abd Soft, Non tender, No organomegaly appriciated, No rebound -guarding or rigidity. No Cyanosis, Clubbing or edema, No new Rash or bruise  Data Review   Major procedures and Radiology Reports - PLEASE review detailed and final reports for all details, in brief -       Dg Chest 2 View  06/06/2013   *RADIOLOGY REPORT*  Clinical Data: Cough and shortness of breath  CHEST - 2 VIEW  Comparison: 01/11/2012  Findings: The cardiac silhouette is normal in size and configuration.  The mediastinum is normal in contour caliber. There are no hilar masses.  The lungs are clear.  No pleural effusion or pneumothorax.  The bony thorax is demineralized but intact.  IMPRESSION: No acute cardiopulmonary disease.  No change from the prior study.   Original Report Authenticated By: Amie Portland, M.D.   Dg Chest Port 1 View  06/09/2013   *RADIOLOGY REPORT*  Clinical Data: Shortness of breath.  PORTABLE CHEST - 1 VIEW  Comparison: 06/06/2013.  Findings: Trachea is midline.  Heart size is accentuated by AP technique.  Lungs are somewhat low in volume with streaky atelectasis at both lung bases.  No pleural fluid.  Old upper right rib fracture.  IMPRESSION: Minimal streaky atelectasis at the lung bases.   Original Report Authenticated By: Leanna Battles, M.D.    Micro Results      No results found for this or any previous visit (from the past 240 hour(s)).   CBC w Diff: Lab Results  Component Value Date   WBC 13.8* 06/08/2013   HGB 12.5 06/08/2013   HCT 36.6 06/08/2013   PLT 199 06/08/2013   LYMPHOPCT 5* 06/07/2013   MONOPCT 1* 06/07/2013   EOSPCT 0 06/07/2013   BASOPCT 0 06/07/2013    CMP: Lab Results  Component Value Date   NA 142 06/08/2013   K 3.4* 06/08/2013   CL 104 06/08/2013   CO2 30 06/08/2013   BUN 25* 06/08/2013   CREATININE 0.86 06/08/2013  .   Discharge Instructions     Follow  with Primary MD Irving Copas, MD in 4 days   Get CBC, CMP, checked 4 days by Primary MD and again as instructed by your Primary MD. Get a 2 view Chest X ray done next visit .  Get Medicines reviewed and adjusted.  Please request your Prim.MD to go over all Hospital Tests and Procedure/Radiological results at the follow up, please get all Hospital records sent to your [facility]  MD by signing hospital release before you go home.  Activity: As tolerated with Full fall precautions use walker/cane & assistance as needed   Diet:  Heart healthy low carbohydrate  For Heart failure patients - Check your Weight same time everyday, if you gain over 2 pounds, or you develop in leg swelling, experience more shortness of breath or chest pain, call your Primary MD immediately. Follow Cardiac Low Salt Diet and 1.8 lit/day fluid restriction.  Disposition Home    If you experience worsening of your admission symptoms, develop shortness of breath, life threatening emergency, suicidal or homicidal thoughts you must seek medical attention immediately by calling 911 or calling your MD immediately  if symptoms less severe.  You Must read complete instructions/literature along with all the possible adverse reactions/side effects for all the Medicines you take and that have been prescribed to you. Take any new Medicines after you have completely understood and accpet all the possible adverse reactions/side effects.   Do not drive and provide baby sitting services if your were admitted for syncope or siezures until you have seen by Primary MD or a Neurologist and advised to do so again.  Do not drive when taking Pain medications.    Do not take more than prescribed Pain, Sleep and Anxiety Medications  Special Instructions: If you have smoked or chewed Tobacco  in the last 2 yrs please stop smoking, stop any regular Alcohol  and or any Recreational drug use.  Wear Seat belts while driving.   Please  note  You were cared for by a hospitalist during your hospital stay. If you have any questions about your discharge medications or the care you received while you were in the hospital after you are discharged, you can call the unit and asked to speak with the hospitalist on call if the hospitalist that took care of you is not available. Once you are discharged, your primary care physician will handle any further medical issues. Please note that NO REFILLS for any discharge medications will be authorized once you are discharged, as it is imperative that you return to your primary care physician (or establish a relationship with a primary care physician if you do not have one) for your aftercare needs so that they can reassess your need for medications and monitor your lab values.    Follow-up Information   Follow up with Irving Copas, MD. Schedule an appointment as soon as possible for a visit in 4 days.   Specialty:  Family Medicine   Contact information:   [address], [address] [place] [address] [phone]       Follow up with [facility], MD. Schedule an appointment as soon as possible for a visit in 1 week.   Specialty:  Pulmonary Disease   Contact information:   [address] [place] [address] [phone]         Discharge Medications     Medication List    STOP taking these medications       doxycycline 100 MG tablet  Commonly known as:  VIBRA-TABS     predniSONE 20 MG tablet  Commonly known as:  DELTASONE  Replaced by:  predniSONE 5 MG tablet      TAKE these medications       albuterol (2.5 MG/3ML) 0.083% nebulizer solution  Commonly known as:  PROVENTIL  Take 3 mL (2.5 mg total) by nebulization every 6 (six) hours as needed for wheezing or shortness of breath (Dispense  nebulizer kit with medication).     hydrochlorothiazide 25 MG tablet  Commonly known as:  HYDRODIURIL  Take 25 mg by mouth daily.     levofloxacin 750 MG tablet   Commonly known as:  LEVAQUIN  Take 1 tablet (750 mg total) by mouth daily.     metoprolol 50 MG tablet  Commonly known as:  LOPRESSOR  Take 50 mg by mouth daily.     predniSONE 5 MG tablet  Commonly known as:  DELTASONE  Label  & dispense according to the schedule below. 10 Pills PO for 3 days then, 8 Pills PO for 3 days, 6 Pills PO for 3 days, 4 Pills PO for 3 days, 2 Pills PO for 3 days, 1 Pills PO for 3 days, 1/2 Pill  PO for 3 days then STOP. Total 95 pills.           Total Time in preparing paper work, data evaluation and todays exam - 35 minutes  Leroy Sea M.D on 06/09/2013 at 10:19 AM  [facility] Office  [phone]

## 2013-07-07 ENCOUNTER — Ambulatory Visit (INDEPENDENT_AMBULATORY_CARE_PROVIDER_SITE_OTHER): Payer: Medicare Other | Admitting: Internal Medicine

## 2013-07-07 ENCOUNTER — Encounter: Payer: Self-pay | Admitting: Internal Medicine

## 2013-07-07 VITALS — BP 138/82 | HR 68 | Temp 98.0°F | Ht 68.0 in | Wt 190.0 lb

## 2013-07-07 DIAGNOSIS — R062 Wheezing: Secondary | ICD-10-CM

## 2013-07-07 DIAGNOSIS — R0600 Dyspnea, unspecified: Secondary | ICD-10-CM

## 2013-07-07 DIAGNOSIS — R0602 Shortness of breath: Secondary | ICD-10-CM

## 2013-07-07 NOTE — Progress Notes (Signed)
Subjective:    Patient ID: Kelly Delgado, female    DOB: 1936-03-02, 77 y.o.   MRN: [id number] PCP Irving Copas, MD  IOV 07/07/2013   HPI  IOV 07/07/2013  77 year old female. Here for evaluation for "asthmatic bronchitis" next  At baseline for the last few to several years she's had intermittent episodes of wheezing during rainy season, spring season with pollen exposure, heat and humidity and when she catches her respiratory infection. Usually these episodes are treated with prednisone and antibiotics. She recollects at least 5 courses of prednisone antibiotics in the last several years. In between episodes she is essentially asymptomatic other than mild dyspnea on exertion for class II activities. It is always relieved by rest.  Most recently in early August 2014 she was in [place] and picked up a cold and her wheezing symptoms during a cookout. The next day she was driving back to [facility] and her respirations got worse but she does not position to take immediate help she arrived in [place] and then was so short of breath that she called EMS and was hospitalized from 06/07/2013 2 06/09/2013 and was discharged the diagnosis of either COPD exacerbation vs asthmatic bronchitis exacerbation. It was noted that she was briefly on oxygen during this admission but subsequently he had hypoxemia at rest resolved. Since then she is returned to baseline.  Laboratory 05/28/2013 creatinine 0.9 mg percent, hemoglobin 2.5 g percent., Troponin 0.02 and normal  CXR 06/09/13  - streaky atx at lung base  Spirometry 07/07/2013 - FVC 2.27L/69%, FEv1 1.56L/64%, Ratio 69/94% - c/w RESTRICTION  Walking desaturation test 07/07/2013  - 185 her feet x3 laps on room air : At the end of first lap 88%. At the end of second lap she is 85%. Again the third lap she is 83%. YES DESATURATED  EXposures - reports that she quit smoking about 44 years ago. Her smoking use included Cigarettes. She has a 3.3  pack-year smoking history. She has never used smokeless tobacco.    Past Medical History  Diagnosis Date  . Hypertension   . Basal cell carcinoma of breast 1990's?    "left side, burned it off" (06/07/2013)  . Schatzki's ring   . Asthmatic bronchitis   . Shortness of breath     "related to asthmatic bronchitis only" (06/07/2013)  . H/O hiatal hernia   . GERD (gastroesophageal reflux disease)   . Migraines     "once a year usually" (06/07/2013)     Family History  Problem Relation Age of Onset  . Cancer Mother   . Stroke Mother   . Hypertension Mother      History   Social History  . Marital Status: Married    Spouse Name: N/A    Number of Children: N/A  . Years of Education: N/A   Occupational History  . Not on file.   Social History Main Topics  . Smoking status: Former Smoker -- 0.33 packs/day for 10 years    Types: Cigarettes    Quit date: 11/27/1968  . Smokeless tobacco: Never Used  . Alcohol Use: 0.0 oz/week     Comment: 06/07/2013 "glass of wine q month or 2"   . Drug Use: No  . Sexual Activity: Yes   Other Topics Concern  . Not on file   Social History Narrative  . No narrative on file     Allergies  Allergen Reactions  . Spiriva [Tiotropium Bromide Monohydrate] Itching     Outpatient Prescriptions Prior to Visit  Medication Sig Dispense Refill  . albuterol (PROVENTIL) (2.5 MG/3ML) 0.083% nebulizer solution Take 3 mL (2.5 mg total) by nebulization every 6 (six) hours as needed for wheezing or shortness of breath (Dispense nebulizer kit with medication).  75 mL  12  . hydrochlorothiazide (HYDRODIURIL) 25 MG tablet Take 25 mg by mouth daily.      . metoprolol (LOPRESSOR) 50 MG tablet Take 50 mg by mouth daily.      Marland Kitchen levofloxacin (LEVAQUIN) 750 MG tablet Take 1 tablet (750 mg total) by mouth daily.  5 tablet  0  . predniSONE (DELTASONE) 5 MG tablet Label  & dispense according to the schedule below. 10 Pills PO for 3 days then, 8 Pills PO for 3 days, 6  Pills PO for 3 days, 4 Pills PO for 3 days, 2 Pills PO for 3 days, 1 Pills PO for 3 days, 1/2 Pill  PO for 3 days then STOP. Total 95 pills.  95 tablet  0   No facility-administered medications prior to visit.      Review of Systems  Constitutional: Positive for fatigue. Negative for fever and unexpected weight change.  HENT: Negative for ear pain, nosebleeds, congestion, sore throat, rhinorrhea, sneezing, trouble swallowing, dental problem, postnasal drip and sinus pressure.   Eyes: Negative for redness and itching.  Respiratory: Positive for shortness of breath. Negative for cough, chest tightness and wheezing.   Cardiovascular: Negative for palpitations and leg swelling.  Gastrointestinal: Negative for nausea and vomiting.  Genitourinary: Negative for dysuria.  Musculoskeletal: Negative for joint swelling.  Skin: Negative for rash.  Neurological: Negative for headaches.  Hematological: Does not bruise/bleed easily.  Psychiatric/Behavioral: Negative for dysphoric mood. The patient is not nervous/anxious.        Objective:   Physical Exam  Vitals reviewed. Constitutional: She is oriented to person, place, and time. She appears well-developed and well-nourished. No distress.  HENT:  Head: Normocephalic and atraumatic.  Right Ear: External ear normal.  Left Ear: External ear normal.  Mouth/Throat: Oropharynx is clear and moist. No oropharyngeal exudate.  Eyes: Conjunctivae and EOM are normal. Pupils are equal, round, and reactive to light. Right eye exhibits no discharge. Left eye exhibits no discharge. No scleral icterus.  Neck: Normal range of motion. Neck supple. No JVD present. No tracheal deviation present. No thyromegaly present.  Cardiovascular: Normal rate, regular rhythm, normal heart sounds and intact distal pulses.  Exam reveals no gallop and no friction rub.   No murmur heard. Pulmonary/Chest: Effort normal and breath sounds normal. No respiratory distress. She has no  wheezes. She has no rales. She exhibits no tenderness.  Abdominal: Soft. Bowel sounds are normal. She exhibits no distension and no mass. There is no tenderness. There is no rebound and no guarding.  Musculoskeletal: Normal range of motion. She exhibits no edema and no tenderness.  Lymphadenopathy:    She has no cervical adenopathy.  Neurological: She is alert and oriented to person, place, and time. She has normal reflexes. No cranial nerve deficit. She exhibits normal muscle tone. Coordination normal.  Skin: Skin is warm and dry. No rash noted. She is not diaphoretic. No erythema. No pallor.  Psychiatric: She has a normal mood and affect. Her behavior is normal. Judgment and thought content normal.          Assessment & Plan:

## 2013-07-07 NOTE — Patient Instructions (Addendum)
Do onvernight oxygen study Do PFT test Will call you with results to decide next step

## 2013-07-10 NOTE — Assessment & Plan Note (Signed)
I am more concerned about ILD given restricted spirometry and desat with walkking and very limited smoking hx. Wil get ONO and also full PFT. IF full PFT  Suggests retriction will get CT chest HRCT wo contrast  SHe is agreeable iwht plan

## 2013-07-12 ENCOUNTER — Telehealth: Payer: Self-pay | Admitting: Internal Medicine

## 2013-07-12 DIAGNOSIS — R0602 Shortness of breath: Secondary | ICD-10-CM

## 2013-07-12 NOTE — Telephone Encounter (Signed)
ONO 07/11/13: lowest pulse 74%, AWaje oulse ox 96%, Time < 88% is 92 min. ODI 286 with 35 events/hour  SHe qualifies for both home nocturjnal o2 and eexertional O2 - 2L [place]. She can start this now while we try to workup etiology. Once PFT done place on my desk   Dr. Kalman Shan, M.D., [facility].C.P Pulmonary and Critical Care Medicine Staff Physician [facility] [place] Pulmonary and Critical Care Pager: [phone], If no answer or between  15:00h - 7:00h: call [phone]  07/12/2013 12:36 PM

## 2013-07-12 NOTE — Telephone Encounter (Signed)
ONO received and placed on MR look at.  Please advise on oxygen order.

## 2013-07-13 ENCOUNTER — Telehealth: Payer: Self-pay | Admitting: Internal Medicine

## 2013-07-13 NOTE — Telephone Encounter (Signed)
ATC patient to make her aware, no answer LMOMTCB Order has been placed to [facility] for patients o2

## 2013-07-13 NOTE — Telephone Encounter (Signed)
I spoke with pt. She stated when the device was brought to her she was told it still had time on the machine. Pt stated she is not sure if we received the correct results or not. Pt stated she does not want the O2 ordered unless she has another ONO done on a cleared out device.   I called and spoke with melissa from [employer]. She stated the machine does not allow old information to be pulled from the machine. When pt signed AOB (assigment of benefits) the machine is taken back to the office and the results are faxed to [place] and virtual ox. The AOB is signed when machine is dropped off with date. So the results we received are correct. If the "time" pt was told about is considered an "artifact" and the machine does not accept that time from the last test done. As soon as pt turned machine on that night is when the test start recording.   I called and made pt aware. She stated she wants to talk to [facility] personally. She will call [facility] and speak with them. She stated she does not want the O2 and she does not trust the test now. She wants to see what [facility] tells her. Nothing further needed

## 2013-07-13 NOTE — Telephone Encounter (Signed)
Spoke with patient Patient is aware o2 order has been placed Also informed that follow up with MR will be arranged after her PFT is done per OV instructions frpm 07/07/13 visit Patient verbalized understanding and nothing further needed at this time

## 2013-07-14 ENCOUNTER — Ambulatory Visit (HOSPITAL_COMMUNITY)
Admission: RE | Admit: 2013-07-14 | Discharge: 2013-07-14 | Disposition: A | Discharge status: Home / Self Care | Payer: Medicare Other | Source: Ambulatory Visit | Attending: Internal Medicine | Admitting: Internal Medicine

## 2013-07-14 DIAGNOSIS — R0609 Other forms of dyspnea: Secondary | ICD-10-CM | POA: Insufficient documentation

## 2013-07-14 DIAGNOSIS — R062 Wheezing: Secondary | ICD-10-CM

## 2013-07-14 DIAGNOSIS — R0989 Other specified symptoms and signs involving the circulatory and respiratory systems: Secondary | ICD-10-CM | POA: Insufficient documentation

## 2013-07-14 DIAGNOSIS — R0600 Dyspnea, unspecified: Secondary | ICD-10-CM

## 2013-07-14 LAB — PULMONARY FUNCTION TEST

## 2013-07-14 MED ORDER — ALBUTEROL SULFATE (5 MG/ML) 0.5% IN NEBU
2.5000 mg | INHALATION_SOLUTION | Freq: Once | RESPIRATORY_TRACT | Status: AC
  Administered 2013-07-14: 2.5 mg via RESPIRATORY_TRACT

## 2013-07-20 ENCOUNTER — Telehealth: Payer: Self-pay | Admitting: Internal Medicine

## 2013-07-20 NOTE — Telephone Encounter (Signed)
Pt had PFT done 07/14/13. She is requesting results. I advised her MR is not in the office until next week. She was fine with this. Please advise once results are reviewed MR thanks

## 2013-07-20 NOTE — Telephone Encounter (Signed)
ono 07/11/13 reviewe. Se had 35 desat events per hour. Total time < 88% was 92 min. Lowest pulse ox 74%. SOs she qualifeis for o2. However, I saw a phone note that she is not trusting the ONO result. Please clarify with hyer. Based on this she qualifies for2L  o2 at night. If she wants repeat ono, she has to sort out with [facility]  Dr. Kalman Shan, M.D., [facility].C.P Pulmonary and Critical Care Medicine Staff Physician [place]  Pulmonary and Critical Care Pager: [phone], If no answer or between  15:00h - 7:00h: call [phone]  07/20/2013 4:29 PM

## 2013-07-21 NOTE — Telephone Encounter (Signed)
Pt already knows ONO results. She was asking for PFT results. Per last visit pt needed to f/u after PFT so appt set for 07-28-13. Carron Curie, CMA

## 2013-07-28 ENCOUNTER — Encounter: Payer: Self-pay | Admitting: Internal Medicine

## 2013-07-28 ENCOUNTER — Ambulatory Visit (INDEPENDENT_AMBULATORY_CARE_PROVIDER_SITE_OTHER): Payer: Medicare Other | Admitting: Internal Medicine

## 2013-07-28 VITALS — BP 124/78 | HR 78 | Temp 98.1°F | Ht 68.0 in | Wt 188.2 lb

## 2013-07-28 DIAGNOSIS — R0600 Dyspnea, unspecified: Secondary | ICD-10-CM

## 2013-07-28 DIAGNOSIS — R0609 Other forms of dyspnea: Secondary | ICD-10-CM

## 2013-07-28 NOTE — Patient Instructions (Addendum)
Increase symbicort from 2 puff once daily to  2 puff twice daily Start tudorza 1 puff twice daily (spiriva made her itch) Use albuterl as needed CMA will ensure technique Have CT chest to rule out ILD; will call with results   - if scarring will do autoimmune blood work for those  - if copd/asthma - will add prednisone burst again Followup  Timing Depending on ct results

## 2013-07-28 NOTE — Progress Notes (Signed)
Subjective:    Patient ID: Kelly Delgado, female    DOB: [birth year], 77 y.o.   MRN: [id number]  HPI   IOV 07/07/2013  77 year old female. Here for evaluation for "asthmatic bronchitis" next  At baseline for the last few to several years she's had intermittent episodes of wheezing during rainy season, spring season with pollen exposure, heat and humidity and when she catches her respiratory infection. Usually these episodes are treated with prednisone and antibiotics. She recollects at least 5 courses of prednisone antibiotics in the last several years. In between episodes she is essentially asymptomatic other than mild dyspnea on exertion for class II activities. It is always relieved by rest.  Most recently in early August 2014 she was in [place] and picked up a cold and her wheezing symptoms during a cookout. The next day she was driving back to [facility] and her respirations got worse but she does not position to take immediate help she arrived in [place] and then was so short of breath that she called EMS and was hospitalized from 06/07/2013 2 06/09/2013 and was discharged the diagnosis of either COPD exacerbation vs asthmatic bronchitis exacerbation. It was noted that she was briefly on oxygen during this admission but subsequently he had hypoxemia at rest resolved. Since then she is returned to baseline.  Laboratory 05/28/2013 creatinine 0.9 mg percent, hemoglobin 2.5 g percent., Troponin 0.02 and normal  CXR 06/09/13  - streaky atx at lung base  Spirometry 07/07/2013 - FVC 2.27L/69%, FEv1 1.56L/64%, Ratio 69/94% - c/w RESTRICTION  Walking desaturation test 07/07/2013  - 185 her feet x3 laps on room air : At the end of first lap 88%. At the end of second lap she is 85%. Again the third lap she is 83%. YES DESATURATED  EXposures - reports that she quit smoking about 44 years ago. Her smoking use included Cigarettes. She has a 3.3 pack-year smoking history. She has never used  smokeless tobacco.    OV 07/28/2013 Here to review test results:    ONO 07/11/13: lowest pulse 74%, AW aje oulse ox 96%, Time < 88% is 92 min. ODI 286 with 35 events/hour. She is using O2 but not sure is helping   PFT 07/14/13: FVC 1.94 L/50%. FEV1 1.2 L/48%. Ratio is obstruction and lowered at 63/83%. She is 450 cc bronchodilator response or 36% with FEV1 up at 1.67 L/66%. And pos bronchodilator ratio at 69. Total lung capacity 4.9 L/87% and normal. DLCO 13.5/45% and reduced. Overall she seems to have obstruction with reduced diffusion capacity but total lung capacity is normal raising the question of possible coexistent restriction at a milder level  Review of Systems  Constitutional: Negative for fever and unexpected weight change.  HENT: Negative for ear pain, nosebleeds, congestion, sore throat, rhinorrhea, sneezing, trouble swallowing, dental problem, postnasal drip and sinus pressure.   Eyes: Negative for redness and itching.  Respiratory: Negative for cough, chest tightness, shortness of breath and wheezing.   Cardiovascular: Negative for palpitations and leg swelling.  Gastrointestinal: Negative for nausea and vomiting.  Genitourinary: Negative for dysuria.  Musculoskeletal: Negative for joint swelling.  Skin: Negative for rash.  Neurological: Negative for headaches.  Hematological: Does not bruise/bleed easily.  Psychiatric/Behavioral: Negative for dysphoric mood. The patient is not nervous/anxious.    Current outpatient prescriptions:albuterol (PROAIR HFA) 108 (90 BASE) MCG/ACT inhaler, Inhale 2 puffs into the lungs every 6 (six) hours as needed for wheezing., Disp: , Rfl: ;  albuterol (PROVENTIL) (2.5 MG/3ML) 0.083% nebulizer solution, Take  3 mL (2.5 mg total) by nebulization every 6 (six) hours as needed for wheezing or shortness of breath (Dispense nebulizer kit with medication)., Disp: 75 mL, Rfl: 12 hydrochlorothiazide (HYDRODIURIL) 25 MG tablet, Take 25 mg by mouth daily.,  Disp: , Rfl: ;  metoprolol (LOPRESSOR) 50 MG tablet, Take 50 mg by mouth daily., Disp: , Rfl: ;  SYMBICORT 160-4.5 MCG/ACT inhaler, Inhale 1 puff into the lungs 2 (two) times daily., Disp: , Rfl:       Objective:   Physical Exam    Physical Exam  Vitals reviewed. Constitutional: She is oriented to person, place, and time. She appears well-developed and well-nourished. No distress.  HENT:  Head: Normocephalic and atraumatic.  Right Ear: External ear normal.  Left Ear: External ear normal.  Mouth/Throat: Oropharynx is clear and moist. No oropharyngeal exudate.  Eyes: Conjunctivae and EOM are normal. Pupils are equal, round, and reactive to light. Right eye exhibits no discharge. Left eye exhibits no discharge. No scleral icterus.  Neck: Normal range of motion. Neck supple. No JVD present. No tracheal deviation present. No thyromegaly present.  Cardiovascular: Normal rate, regular rhythm, normal heart sounds and intact distal pulses.  Exam reveals no gallop and no friction rub.   No murmur heard. Pulmonary/Chest: Effort normal and breath sounds normal. No respiratory distress. She has no wheezes. She has no rales. She exhibits no tenderness.  Abdominal: Soft. Bowel sounds are normal. She exhibits no distension and no mass. There is no tenderness. There is no rebound and no guarding.  Musculoskeletal: Normal range of motion. She exhibits no edema and no tenderness.  Lymphadenopathy:    She has no cervical adenopathy.  Neurological: She is alert and oriented to person, place, and time. She has normal reflexes. No cranial nerve deficit. She exhibits normal muscle tone. Coordination normal.  Skin: Skin is warm and dry. No rash noted. She is not diaphoretic. No erythema. No pallor.  Psychiatric: She has a normal mood and affect. Her behavior is normal. Judgment and thought content normal.          Assessment & Plan:        Assessment & Plan:

## 2013-07-29 DIAGNOSIS — R0600 Dyspnea, unspecified: Secondary | ICD-10-CM | POA: Insufficient documentation

## 2013-07-29 NOTE — Assessment & Plan Note (Signed)
Dyspnea is largely from obstructive lung disease. Need to rule out ILD  PLAN Increase symbicort from 2 puff once daily to  2 puff twice daily Start tudorza 1 puff twice daily (spiriva made her itch) Use albuterl as needed CMA will ensure technique Have CT chest to rule out ILD; will call with results   - if scarring will do autoimmune blood work for those  - if copd/asthma - will add prednisone burst again Followup  Timing Depending on ct results   > 50% of this > 25 min visit spent in face to face counseling (15 min visit converted to 25 min)

## 2013-08-03 ENCOUNTER — Ambulatory Visit (INDEPENDENT_AMBULATORY_CARE_PROVIDER_SITE_OTHER)
Admission: RE | Admit: 2013-08-03 | Discharge: 2013-08-03 | Disposition: A | Discharge status: Home / Self Care | Payer: Medicare Other | Source: Ambulatory Visit | Attending: Internal Medicine | Admitting: Internal Medicine

## 2013-08-03 DIAGNOSIS — R0609 Other forms of dyspnea: Secondary | ICD-10-CM

## 2013-08-03 DIAGNOSIS — R0600 Dyspnea, unspecified: Secondary | ICD-10-CM

## 2013-08-05 ENCOUNTER — Other Ambulatory Visit: Payer: Self-pay | Admitting: Internal Medicine

## 2013-08-05 ENCOUNTER — Telehealth: Payer: Self-pay | Admitting: Internal Medicine

## 2013-08-05 DIAGNOSIS — I7090 Unspecified atherosclerosis: Secondary | ICD-10-CM

## 2013-08-05 MED ORDER — PREDNISONE (PAK) 10 MG PO TABS: 10.0000 mg | ORAL_TABLET | Freq: Every day | ORAL | Status: DC

## 2013-08-05 NOTE — Telephone Encounter (Signed)
REviewed ct 08/03/13. Sending to triage due to close of week  1. No ILD. Just asthma/copd. So, repeat Take prednisone 40 mg daily x 2 days, then 20mg  daily x 2 days, then 10mg  daily x 2 days, then 5mg  daily x 2 days and stop  2. Also, calcium deposit on heart blood vessel. So maybe /maybe not heart vessel blockage. IF she has not had stress test in past few years, refer to cardiology. Non-urgen but within few weeks - [place] or dr Jacinto Halim  3. Real small lugn nodules - need followup with CT in 9 months; I will eexplain at fu  4. GFive FU < 1 month with spirometry at followup   Dr. Kalman Shan, M.D., [facility].C.P Pulmonary and Critical Care Medicine Staff Physician [facility] [place] [facility] and Critical Care Pager: [phone], If no answer or between  15:00h - 7:00h: call [phone]  08/05/2013 4:43 PM

## 2013-08-05 NOTE — Telephone Encounter (Signed)
Spoke with the pt and notified of all recs per MR She verbalized understanding and states no questions  Rx was sent to pharm  Appt was scheduled

## 2013-09-07 ENCOUNTER — Ambulatory Visit (INDEPENDENT_AMBULATORY_CARE_PROVIDER_SITE_OTHER): Payer: Medicare Other | Admitting: Internal Medicine

## 2013-09-07 ENCOUNTER — Encounter: Payer: Self-pay | Admitting: Internal Medicine

## 2013-09-07 VITALS — BP 126/82 | HR 61 | Ht 68.0 in | Wt 192.0 lb

## 2013-09-07 DIAGNOSIS — R911 Solitary pulmonary nodule: Secondary | ICD-10-CM

## 2013-09-07 DIAGNOSIS — J449 Chronic obstructive pulmonary disease, unspecified: Secondary | ICD-10-CM

## 2013-09-07 DIAGNOSIS — I2510 Atherosclerotic heart disease of native coronary artery without angina pectoris: Secondary | ICD-10-CM

## 2013-09-07 DIAGNOSIS — R0602 Shortness of breath: Secondary | ICD-10-CM

## 2013-09-07 DIAGNOSIS — I2584 Coronary atherosclerosis due to calcified coronary lesion: Secondary | ICD-10-CM

## 2013-09-07 DIAGNOSIS — I10 Essential (primary) hypertension: Secondary | ICD-10-CM

## 2013-09-07 MED ORDER — ACLIDINIUM BROMIDE 400 MCG/ACT IN AEPB: 1.0000 | INHALATION_SPRAY | Freq: Two times a day (BID) | RESPIRATORY_TRACT | Status: DC

## 2013-09-07 MED ORDER — LOSARTAN POTASSIUM 50 MG PO TABS: 50.0000 mg | ORAL_TABLET | Freq: Every day | ORAL | Status: DC

## 2013-09-07 NOTE — Progress Notes (Signed)
Subjective: IOV 07/07/2013  77 year old female. Here for evaluation for "asthmatic bronchitis" next  At baseline for the last few to several years she's had intermittent episodes of wheezing during rainy season, spring season with pollen exposure, heat and humidity and when she catches her respiratory infection. Usually these episodes are treated with prednisone and antibiotics. She recollects at least 5 courses of prednisone antibiotics in the last several years. In between episodes she is essentially asymptomatic other than mild dyspnea on exertion for class II activities. It is always relieved by rest.  Most recently in early August 2014 she was in [place] and picked up a cold and her wheezing symptoms during a cookout. The next day she was driving back to [facility] and her respirations got worse but she does not position to take immediate help she arrived in [place] and then was so short of breath that she called EMS and was hospitalized from 06/07/2013 2 06/09/2013 and was discharged the diagnosis of either COPD exacerbation vs asthmatic bronchitis exacerbation. It was noted that she was briefly on oxygen during this admission but subsequently he had hypoxemia at rest resolved. Since then she is returned to baseline.  Laboratory 05/28/2013 creatinine 0.9 mg percent, hemoglobin 2.5 g percent., Troponin 0.02 and normal  CXR 06/09/13  - streaky atx at lung base  Spirometry 07/07/2013 - FVC 2.27L/69%, FEv1 1.56L/64%, Ratio 69/94% - c/w RESTRICTION  Walking desaturation test 07/07/2013  - 185 her feet x3 laps on room air : At the end of first lap 88%. At the end of second lap she is 85%. Again the third lap she is 83%. YES DESATURATED  EXposures - reports that she quit smoking about 44 years ago. Her smoking use included Cigarettes. She has a 3.3 pack-year smoking history. She has never used smokeless tobacco.    OV 07/28/2013 Here to review test results:    ONO 07/11/13: lowest pulse  74%, AW aje oulse ox 96%, Time < 88% is 92 min. ODI 286 with 35 events/hour. She is using O2 but not sure is helping   PFT 07/14/13: FVC 1.94 L/50%. FEV1 1.2 L/48%. Ratio is obstruction and lowered at 63/83%. She is 450 cc bronchodilator response or 36% with FEV1 up at 1.67 L/66%. And pos bronchodilator ratio at 69. Total lung capacity 4.9 L/87% and normal. DLCO 13.5/45% and reduced. Overall she seems to have obstruction with reduced diffusion capacity but total lung capacity is normal raising the question of possible coexistent restriction at a milder level    Patient ID: Kelly Delgado, female    DOB: [birth year], 77 y.o.   MRN: [id number]  HPI   IOV 07/07/2013  77 year old female. Here for evaluation for "asthmatic bronchitis" next  At baseline for the last few to several years she's had intermittent episodes of wheezing during rainy season, spring season with pollen exposure, heat and humidity and when she catches her respiratory infection. Usually these episodes are treated with prednisone and antibiotics. She recollects at least 5 courses of prednisone antibiotics in the last several years. In between episodes she is essentially asymptomatic other than mild dyspnea on exertion for class II activities. It is always relieved by rest.  Most recently in early August 2014 she was in [place] and picked up a cold and her wheezing symptoms during a cookout. The next day she was driving back to [place] and her respirations got worse but she does not position to take immediate help she arrived in [place] and then was so short of  breath that she called EMS and was hospitalized from 06/07/2013 2 06/09/2013 and was discharged the diagnosis of either COPD exacerbation vs asthmatic bronchitis exacerbation. It was noted that she was briefly on oxygen during this admission but subsequently he had hypoxemia at rest resolved. Since then she is returned to baseline.  Laboratory 05/28/2013 creatinine 0.9 mg  percent, hemoglobin 2.5 g percent., Troponin 0.02 and normal  CXR 06/09/13  - streaky atx at lung base  Spirometry 07/07/2013 - FVC 2.27L/69%, FEv1 1.56L/64%, Ratio 69/94% - c/w RESTRICTION  Walking desaturation test 07/07/2013  - 185 her feet x3 laps on room air : At the end of first lap 88%. At the end of second lap she is 85%. Again the third lap she is 83%. YES DESATURATED  EXposures - reports that she quit smoking about 44 years ago. Her smoking use included Cigarettes. She has a 3.3 pack-year smoking history. She has never used smokeless tobacco.    OV 07/28/2013 Here to review test results:    ONO 07/11/13: lowest pulse 74%, AW aje oulse ox 96%, Time < 88% is 92 min. ODI 286 with 35 events/hour. She is using O2 but not sure is helping   PFT 07/14/13: FVC 1.94 L/50%. FEV1 1.2 L/48%. Ratio is obstruction and lowered at 63/83%. She is 450 cc bronchodilator response or 36% with FEV1 up at 1.67 L/66%. And pos bronchodilator ratio at 69. Total lung capacity 4.9 L/87% and normal. DLCO 13.5/45% and reduced. Overall she seems to have obstruction with reduced diffusion capacity but total lung capacity is normal raising the question of possible coexistent restriction at a milder level  REC Increase symbicort from 2 puff once daily to  2 puff twice daily Start tudorza 1 puff twice daily (spiriva made her itch) Use albuterl as needed CMA will ensure technique Have CT chest to rule out ILD; will call with results   - if scarring will do autoimmune blood work for those  - if copd/asthma - will add prednisone burst again Followup  Timing Depending on ct results   Telephone call 08/05/13  REviewed ct 08/03/13. Sending to triage due to close of week  1. No ILD. Just asthma/copd (mild emphysema(). So, repeat Take prednisone 40 mg daily x 2 days, then 20mg  daily x 2 days, then 10mg  daily x 2 days, then 5mg  daily x 2 days and stop  2. Also, calcium deposit on heart blood vessel. So maybe /maybe  not heart vessel blockage. IF she has not had stress test in past few years, refer to cardiology. Non-urgen but within few weeks - [place] or dr Jacinto Halim  3. Real small lugn nodules - need followup with CT in 9 months; I will eexplain at fu  4. GFive FU < 1 month with spirometry at followup  OV 09/07/2013    Followup COPD otherwise specified  - Since starting tudorza and higher dose of symbicort she is somewhat better but not a whole load better. CT scan of the chest only shows mild emphysema and coronary artery calcification. She is seeing Dr. Clinton Sawyer and a cardiac stress test and a resting echo is pending. She feels she'll benefit from pulmonary rehabilitation. Also of note she is very concerned that her metoprolol this contradicted her dyspnea and she wants this changed; this is only for hypertension that were started a few years ago. She says that her distress and worsening after starting metoprolol  There no new issues  SPirometry today fev1 1.8L/78%, RA 76; huge  improvement  Review of Systems  Constitutional: Negative for fever and unexpected weight change.  HENT: Negative for congestion, dental problem, ear pain, nosebleeds, postnasal drip, rhinorrhea, sinus pressure, sneezing, sore throat and trouble swallowing.   Eyes: Negative for redness and itching.  Respiratory: Positive for shortness of breath. Negative for cough, chest tightness and wheezing.   Cardiovascular: Negative for palpitations and leg swelling.  Gastrointestinal: Negative for nausea and vomiting.  Genitourinary: Negative for dysuria.  Musculoskeletal: Negative for joint swelling.  Skin: Negative for rash.  Neurological: Negative for headaches.  Hematological: Does not bruise/bleed easily.  Psychiatric/Behavioral: Negative for dysphoric mood. The patient is not nervous/anxious.    Current outpatient prescriptions:albuterol (PROAIR HFA) 108 (90 BASE) MCG/ACT inhaler, Inhale 2 puffs into the lungs every 6 (six) hours  as needed for wheezing., Disp: , Rfl: ;  albuterol (PROVENTIL) (2.5 MG/3ML) 0.083% nebulizer solution, Take 3 mL (2.5 mg total) by nebulization every 6 (six) hours as needed for wheezing or shortness of breath (Dispense nebulizer kit with medication)., Disp: 75 mL, Rfl: 12 atorvastatin (LIPITOR) 10 MG tablet, Take 1 tablet by mouth daily., Disp: , Rfl: ;  hydrochlorothiazide (HYDRODIURIL) 25 MG tablet, Take 25 mg by mouth daily., Disp: , Rfl: ;  metoprolol (LOPRESSOR) 50 MG tablet, Take 50 mg by mouth daily., Disp: , Rfl: ;  SYMBICORT 160-4.5 MCG/ACT inhaler, Inhale 1 puff into the lungs 2 (two) times daily., Disp: , Rfl:      Objective:   Physical Exam  Vitals reviewed. Constitutional: She is oriented to person, place, and time. She appears well-developed and well-nourished. No distress.  HENT:  Head: Normocephalic and atraumatic.  Right Ear: External ear normal.  Left Ear: External ear normal.  Mouth/Throat: Oropharynx is clear and moist. No oropharyngeal exudate.  Eyes: Conjunctivae and EOM are normal. Pupils are equal, round, and reactive to light. Right eye exhibits no discharge. Left eye exhibits no discharge. No scleral icterus.  Neck: Normal range of motion. Neck supple. No JVD present. No tracheal deviation present. No thyromegaly present.  Cardiovascular: Normal rate, regular rhythm, normal heart sounds and intact distal pulses.  Exam reveals no gallop and no friction rub.   No murmur heard. Pulmonary/Chest: Effort normal and breath sounds normal. No respiratory distress. She has no wheezes. She has no rales. She exhibits no tenderness.  Abdominal: Soft. Bowel sounds are normal. She exhibits no distension and no mass. There is no tenderness. There is no rebound and no guarding.  Musculoskeletal: Normal range of motion. She exhibits no edema and no tenderness.  Lymphadenopathy:    She has no cervical adenopathy.  Neurological: She is alert and oriented to person, place, and time. She  has normal reflexes. No cranial nerve deficit. She exhibits normal muscle tone. Coordination normal.  Skin: Skin is warm and dry. No rash noted. She is not diaphoretic. No erythema. No pallor.  Psychiatric: She has a normal mood and affect. Her behavior is normal. Judgment and thought content normal.          Assessment & Plan:

## 2013-09-07 NOTE — Patient Instructions (Addendum)
#  COPD - much better on breathing test - continue tudorza once daily; will do prior auth  - continue symbicor  160, 2 puff twice daily  - use alb as needed - flu shot 09/07/2013  #Lung nodule  - ct scan chest wo contrast 9 months from 09/07/2013  #Coronary artery calcification  - finish workup with Dr Jacinto Halim  #BP - stop lopressor due to copd and per your desire - start losartan 50mg  daily; if expensive call us - monitor BP at home and fu with PCP Kelly Copas, Kelly Delgado  #Followup  3months

## 2013-09-08 ENCOUNTER — Telehealth: Payer: Self-pay | Admitting: *Deleted

## 2013-09-08 NOTE — Telephone Encounter (Signed)
Received P for tudurza. ID # [id number] Phone #: [phone] This was approved from 09/08/13-10/26/14 Case ID#: [id number] I called [facility] and is aware of approval

## 2013-09-09 ENCOUNTER — Emergency Department (HOSPITAL_COMMUNITY): Payer: Medicare Other

## 2013-09-09 ENCOUNTER — Encounter (HOSPITAL_COMMUNITY): Payer: Self-pay | Admitting: Emergency Medicine

## 2013-09-09 ENCOUNTER — Emergency Department (HOSPITAL_COMMUNITY)
Admission: EM | Admit: 2013-09-09 | Discharge: 2013-09-09 | Disposition: A | Discharge status: Home / Self Care | Payer: Medicare Other | Attending: Emergency Medicine | Admitting: Emergency Medicine

## 2013-09-09 DIAGNOSIS — N390 Urinary tract infection, site not specified: Secondary | ICD-10-CM | POA: Insufficient documentation

## 2013-09-09 DIAGNOSIS — K219 Gastro-esophageal reflux disease without esophagitis: Secondary | ICD-10-CM | POA: Insufficient documentation

## 2013-09-09 DIAGNOSIS — I10 Essential (primary) hypertension: Secondary | ICD-10-CM | POA: Insufficient documentation

## 2013-09-09 DIAGNOSIS — Z87891 Personal history of nicotine dependence: Secondary | ICD-10-CM | POA: Insufficient documentation

## 2013-09-09 DIAGNOSIS — Z853 Personal history of malignant neoplasm of breast: Secondary | ICD-10-CM | POA: Insufficient documentation

## 2013-09-09 DIAGNOSIS — J45909 Unspecified asthma, uncomplicated: Secondary | ICD-10-CM | POA: Insufficient documentation

## 2013-09-09 DIAGNOSIS — Z792 Long term (current) use of antibiotics: Secondary | ICD-10-CM | POA: Insufficient documentation

## 2013-09-09 DIAGNOSIS — R319 Hematuria, unspecified: Secondary | ICD-10-CM | POA: Insufficient documentation

## 2013-09-09 DIAGNOSIS — Z79899 Other long term (current) drug therapy: Secondary | ICD-10-CM | POA: Insufficient documentation

## 2013-09-09 LAB — CBC WITH DIFFERENTIAL/PLATELET
Basophils Relative: 1 % (ref 0–1)
Eosinophils Absolute: 0.6 10*3/uL (ref 0.0–0.7)
Eosinophils Relative: 8 % — ABNORMAL HIGH (ref 0–5)
Hemoglobin: 12.8 g/dL (ref 12.0–15.0)
Lymphs Abs: 2.2 10*3/uL (ref 0.7–4.0)
MCH: 30 pg (ref 26.0–34.0)
MCHC: 34 g/dL (ref 30.0–36.0)
MCV: 88.3 fL (ref 78.0–100.0)
Monocytes Relative: 7 % (ref 3–12)
RBC: 4.27 MIL/uL (ref 3.87–5.11)

## 2013-09-09 LAB — URINALYSIS, ROUTINE W REFLEX MICROSCOPIC
Ketones, ur: 15 mg/dL — AB
Nitrite: NEGATIVE
Specific Gravity, Urine: 1.02 (ref 1.005–1.030)
Urobilinogen, UA: 1 mg/dL (ref 0.0–1.0)
pH: 7 (ref 5.0–8.0)

## 2013-09-09 LAB — COMPREHENSIVE METABOLIC PANEL
Albumin: 3.1 g/dL — ABNORMAL LOW (ref 3.5–5.2)
BUN: 18 mg/dL (ref 6–23)
Calcium: 8.9 mg/dL (ref 8.4–10.5)
Creatinine, Ser: 0.97 mg/dL (ref 0.50–1.10)
GFR calc Af Amer: 65 mL/min — ABNORMAL LOW (ref >90)
Glucose, Bld: 98 mg/dL (ref 70–99)
Total Protein: 6.6 g/dL (ref 6.0–8.3)

## 2013-09-09 LAB — URINE MICROSCOPIC-ADD ON

## 2013-09-09 MED ORDER — SULFAMETHOXAZOLE-TRIMETHOPRIM 800-160 MG PO TABS: 1.0000 | ORAL_TABLET | Freq: Two times a day (BID) | ORAL | Status: DC

## 2013-09-09 NOTE — ED Notes (Signed)
In and out cath done -- 30 cc returned.

## 2013-09-09 NOTE — ED Notes (Signed)
Pt ambulated to restroom with no assistance. ?

## 2013-09-09 NOTE — ED Notes (Signed)
Discharge instructions reviewed with pt. Pt verbalized understanding.   

## 2013-09-09 NOTE — ED Notes (Signed)
Pt ambulated to BR without difficulty-- voided 300 cc- cloudy urine

## 2013-09-09 NOTE — ED Provider Notes (Signed)
CSN: [id number]     Arrival date & time 09/09/13  0701 History   First MD Initiated Contact with Patient 09/09/13 [id number]     Chief Complaint  Patient presents with  . Urinary Retention   (Consider location/radiation/quality/duration/timing/severity/associated sxs/prior Treatment) HPI Comments: 77 yo wf with c/o urinary hesitancy, urgency, retention and hematuria.  Onset of symptoms this AM.  Pt also reports dysuria.  No f/c, no CP, SOB, n/v/d, abd pain, extremity pain.  No previous blood in urine.  No vaginal d/c.    Pt started new BP medication (Cozaar) yesterday and Lipitor on Wednesday.    Pt has a PMH of COPD, HTN, hyperlipidemia, GERD.  Scheduled for stress test this AM at 0930.    Patient is a 77 y.o. female presenting with hematuria. The history is provided by the patient.  Hematuria This is a new problem.    Past Medical History  Diagnosis Date  . Hypertension   . Basal cell carcinoma of breast 1990's?    "left side, burned it off" (06/07/2013)  . Schatzki's ring   . Asthmatic bronchitis   . Shortness of breath     "related to asthmatic bronchitis only" (06/07/2013)  . H/O hiatal hernia   . GERD (gastroesophageal reflux disease)   . Migraines     "once a year usually" (06/07/2013)   Past Surgical History  Procedure Laterality Date  . Abdominal hysterectomy  1980's  . Dilation and curettage of uterus  1980's    "1" (06/07/2013)  . Urethral diverticulum repair  1970's  . Cataract extraction w/ intraocular lens  implant, bilateral Bilateral 1990's  . Esophageal dilation  2012    "just once" (06/07/2013)   Family History  Problem Relation Age of Onset  . Cancer Mother   . Stroke Mother   . Hypertension Mother    History  Substance Use Topics  . Smoking status: Former Smoker -- 0.33 packs/day for 10 years    Types: Cigarettes    Quit date: 11/27/1968  . Smokeless tobacco: Never Used  . Alcohol Use: 0.0 oz/week     Comment: 06/07/2013 "glass of wine q month or 2"     OB History   Grav Para Term Preterm Abortions TAB SAB Ect Mult Living   2 2 2             Review of Systems  Constitutional: Negative.   HENT: Negative.   Eyes: Negative.   Respiratory: Negative.   Cardiovascular: Negative.   Gastrointestinal: Negative.   Endocrine: Negative.   Genitourinary: Positive for urgency, frequency and hematuria. Negative for dysuria, flank pain, decreased urine volume, vaginal bleeding, vaginal discharge, enuresis, difficulty urinating, genital sores, menstrual problem, pelvic pain and dyspareunia.       Hematuria and hesitancy  Allergic/Immunologic: Negative.   Neurological: Negative.  Negative for tremors, seizures, syncope, speech difficulty, light-headedness and numbness.  Hematological: Negative.   Psychiatric/Behavioral: Negative.     Allergies  Spiriva  Home Medications   Current Outpatient Rx  Name  Route  Sig  Dispense  Refill  . acetaminophen (TYLENOL) 500 MG tablet   Oral   Take 500 mg by mouth every 6 (six) hours as needed for mild pain.         . Aclidinium Bromide (TUDORZA PRESSAIR) 400 MCG/ACT AEPB   Inhalation   Inhale 1 puff into the lungs 2 (two) times daily.   60 each   6   . albuterol (PROAIR HFA) 108 (90 BASE) MCG/ACT inhaler  Inhalation   Inhale 2 puffs into the lungs every 6 (six) hours as needed for wheezing.         Marland Kitchen albuterol (PROVENTIL) (2.5 MG/3ML) 0.083% nebulizer solution   Nebulization   Take 3 mL (2.5 mg total) by nebulization every 6 (six) hours as needed for wheezing or shortness of breath (Dispense nebulizer kit with medication).   75 mL   12   . atorvastatin (LIPITOR) 10 MG tablet   Oral   Take 1 tablet by mouth daily.         . Calcium Carb-Cholecalciferol (CALCIUM 600 + D PO)   Oral   Take 1 tablet by mouth daily.         . Cholecalciferol (VITAMIN D-3) 1000 UNITS CAPS   Oral   Take 1,000 Units by mouth daily.         . hydrochlorothiazide (HYDRODIURIL) 25 MG tablet   Oral    Take 25 mg by mouth daily.         Marland Kitchen losartan (COZAAR) 50 MG tablet   Oral   Take 1 tablet (50 mg total) by mouth daily.   30 tablet   1   . SYMBICORT 160-4.5 MCG/ACT inhaler   Inhalation   Inhale 2 puffs into the lungs 2 (two) times daily.          Marland Kitchen sulfamethoxazole-trimethoprim (SEPTRA DS) 800-160 MG per tablet   Oral   Take 1 tablet by mouth every 12 (twelve) hours.   10 tablet   0    BP 142/76  Pulse 64  Temp(Src) 97.8 F (36.6 C) (Oral)  Resp 16  SpO2 97% Physical Exam  Nursing note and vitals reviewed. Constitutional: She is oriented to person, place, and time. She appears well-developed and well-nourished.  HENT:  Head: Normocephalic and atraumatic.  Eyes: Conjunctivae are normal. Right eye exhibits no discharge. Left eye exhibits no discharge.  Neck: Normal range of motion. Neck supple.  Cardiovascular: Normal rate and regular rhythm.   Pulmonary/Chest: Effort normal and breath sounds normal. She has no wheezes. She has no rales.  Abdominal: Soft. Bowel sounds are normal. She exhibits no distension and no mass. There is no tenderness. There is no rebound and no guarding.  No g/r/m, no ttp at [place], neg [place]  Musculoskeletal: Normal range of motion. She exhibits no edema and no tenderness.  Neurological: She is alert and oriented to person, place, and time. She has normal reflexes.  Skin: Skin is warm and dry.    ED Course  Procedures (including critical care time) Labs Review Labs Reviewed  URINALYSIS, ROUTINE W REFLEX MICROSCOPIC - Abnormal; Notable for the following:    Color, Urine AMBER (*)    APPearance TURBID (*)    Hgb urine dipstick LARGE (*)    Bilirubin Urine SMALL (*)    Ketones, ur 15 (*)    Protein, ur >300 (*)    Leukocytes, UA LARGE (*)    All other components within normal limits  CBC WITH DIFFERENTIAL - Abnormal; Notable for the following:    Eosinophils Relative 8 (*)    All other components within normal limits   COMPREHENSIVE METABOLIC PANEL - Abnormal; Notable for the following:    Potassium 3.4 (*)    Albumin 3.1 (*)    Alkaline Phosphatase 215 (*)    GFR calc non Af Amer 56 (*)    GFR calc Af Amer 65 (*)    All other components within normal limits  URINE MICROSCOPIC-ADD ON - Abnormal; Notable for the following:    Bacteria, UA FEW (*)    Casts HYALINE CASTS (*)    All other components within normal limits  URINE CULTURE   Imaging Review Ct Abdomen Pelvis Wo Contrast  09/09/2013   CLINICAL DATA:  Urinary retention  EXAM: CT ABDOMEN AND PELVIS WITHOUT CONTRAST  TECHNIQUE: Multidetector CT imaging of the abdomen and pelvis was performed following the standard protocol without intravenous contrast.  COMPARISON:  None.  FINDINGS: The lung bases are clear.  No renal, ureteral, or bladder calculi. No obstructive uropathy. No perinephric stranding is seen. The kidneys are symmetric in size without evidence for exophytic mass. The bladder is unremarkable.  The liver demonstrates no focal abnormality. The gallbladder is unremarkable. The spleen demonstrates no focal abnormality. The adrenal glands and pancreas are normal.  The unopacified stomach, duodenum, small intestine and large intestine are unremarkable, but evaluation is limited by lack of oral contrast. There is a small fat containing umbilical hernia. There is no pneumoperitoneum, pneumatosis, or portal venous gas. There is no abdominal or pelvic free fluid. There is no lymphadenopathy.  The abdominal aorta is normal in caliber with atherosclerosis.  There is degenerative disc disease of the lumbar spine most severe at L4-5 and L5-S1.  IMPRESSION: No urolithiasis or obstructive uropathy.   Electronically Signed   By: Elige Ko   On: 09/09/2013 11:06    EKG Interpretation   None      Results for orders placed during the hospital encounter of 09/09/13  URINALYSIS, ROUTINE W REFLEX MICROSCOPIC      Result Value Range   Color, Urine AMBER (*)  YELLOW   APPearance TURBID (*) CLEAR   Specific Gravity, Urine 1.020  1.005 - 1.030   pH 7.0  5.0 - 8.0   Glucose, UA NEGATIVE  NEGATIVE mg/dL   Hgb urine dipstick LARGE (*) NEGATIVE   Bilirubin Urine SMALL (*) NEGATIVE   Ketones, ur 15 (*) NEGATIVE mg/dL   Protein, ur >161 (*) NEGATIVE mg/dL   Urobilinogen, UA 1.0  0.0 - 1.0 mg/dL   Nitrite NEGATIVE  NEGATIVE   Leukocytes, UA LARGE (*) NEGATIVE  CBC WITH DIFFERENTIAL      Result Value Range   WBC 7.1  4.0 - 10.5 K/uL   RBC 4.27  3.87 - 5.11 MIL/uL   Hemoglobin 12.8  12.0 - 15.0 g/dL   HCT 09.6  04.5 - 40.9 %   MCV 88.3  78.0 - 100.0 fL   MCH 30.0  26.0 - 34.0 pg   MCHC 34.0  30.0 - 36.0 g/dL   RDW 81.1  91.4 - 78.2 %   Platelets 184  150 - 400 K/uL   Neutrophils Relative % 54  43 - 77 %   Neutro Abs 3.8  1.7 - 7.7 K/uL   Lymphocytes Relative 31  12 - 46 %   Lymphs Abs 2.2  0.7 - 4.0 K/uL   Monocytes Relative 7  3 - 12 %   Monocytes Absolute 0.5  0.1 - 1.0 K/uL   Eosinophils Relative 8 (*) 0 - 5 %   Eosinophils Absolute 0.6  0.0 - 0.7 K/uL   Basophils Relative 1  0 - 1 %   Basophils Absolute 0.0  0.0 - 0.1 K/uL  COMPREHENSIVE METABOLIC PANEL      Result Value Range   Sodium 139  135 - 145 mEq/L   Potassium 3.4 (*) 3.5 - 5.1 mEq/L  Chloride 102  96 - 112 mEq/L   CO2 26  19 - 32 mEq/L   Glucose, Bld 98  70 - 99 mg/dL   BUN 18  6 - 23 mg/dL   Creatinine, Ser 1.61  0.50 - 1.10 mg/dL   Calcium 8.9  8.4 - 09.6 mg/dL   Total Protein 6.6  6.0 - 8.3 g/dL   Albumin 3.1 (*) 3.5 - 5.2 g/dL   AST 25  0 - 37 U/L   ALT 20  0 - 35 U/L   Alkaline Phosphatase 215 (*) 39 - 117 U/L   Total Bilirubin 0.4  0.3 - 1.2 mg/dL   GFR calc non Af Amer 56 (*) >90 mL/min   GFR calc Af Amer 65 (*) >90 mL/min  URINE MICROSCOPIC-ADD ON      Result Value Range   Squamous Epithelial / LPF RARE  RARE   WBC, UA TOO NUMEROUS TO COUNT  <3 WBC/hpf   RBC / HPF TOO NUMEROUS TO COUNT  <3 RBC/hpf   Bacteria, UA FEW (*) RARE   Casts HYALINE CASTS (*)  NEGATIVE    MDM   1. Hematuria   2. UTI (lower urinary tract infection)    77 year old white female presents to emergency department with new onset of urinary urgency, mild retention, and hematuria. Patient has not had these symptoms in the past. She has started 2 new medications recently-Cozaar yesterday, Lipitor 2 days ago. She denies fever, chills, chest pain, new cough or shortness of breath, abdominal pain, nausea vomiting diarrhea, extremity pain, or other associated symptoms. Her vital signs are normal. She has a benign physical exam to include her abdominal exam. She denies vaginal symptoms and has had a hysterectomy in the past. At this point we will obtain a urine sample via in and out catheter and obtain basic blood work to include CBC and CMP.  The patient was scheduled for a stress test this morning at 9:00. It is unlikely that she will make this appointment.  ER workup reveals normal CBC, CMP with mild hypokalemia and an elevated alkaline phosphatase but otherwise unremarkable. Urinalysis reveals large hemoglobin, positive ketones, positive urine protein, large amount leukocytes and white blood cells and red blood cells too numerous to count, with few bacteria. CT of abdomen and pelvis without contrast reveals no stone or evidence of hydronephrosis.  Pt has voided in the ER bathroom.  She states the urine is "looking normal now".  She is AFVSS.  Doubt SBI, pyelo, stone, Surgical abdomen.   Plan to treat pt with Abx for UTI.  Pt and husband agree with the plan.  ER precautions given.  Pt will f/u with PCM in 2 days.    Darlys Gales, MD 09/09/13 [phone]

## 2013-09-09 NOTE — ED Notes (Signed)
Has had some urinary frequency and dysuria started this past weekend, and states increased fluid intake and got better until this am. Felt like was unable to urinate this am, and states blood was present in urine.

## 2013-09-11 LAB — URINE CULTURE

## 2013-09-12 ENCOUNTER — Telehealth (HOSPITAL_COMMUNITY): Payer: Self-pay | Admitting: Emergency Medicine

## 2013-09-12 NOTE — ED Notes (Signed)
Post ED Visit - Positive Culture Follow-up  Culture report reviewed by antimicrobial stewardship pharmacist: []  Wes Dulaney, Pharm.D., BCPS []  Celedonio Miyamoto, Pharm.D., BCPS []  Georgina Pillion, Pharm.D., BCPS []  [place], [address].D., BCPS, AAHIVP [x]  Estella Husk, Pharm.D., BCPS, AAHIVP  Positive urine culture Treated with Trimeth-Sulfa, organism sensitive to the same and no further patient follow-up is required at this time.  Kylie A Holland 09/12/2013, 11:59 AM

## 2013-09-18 DIAGNOSIS — I10 Essential (primary) hypertension: Secondary | ICD-10-CM | POA: Insufficient documentation

## 2013-09-18 DIAGNOSIS — I2510 Atherosclerotic heart disease of native coronary artery without angina pectoris: Secondary | ICD-10-CM | POA: Insufficient documentation

## 2013-09-18 DIAGNOSIS — R911 Solitary pulmonary nodule: Secondary | ICD-10-CM | POA: Insufficient documentation

## 2013-09-18 DIAGNOSIS — J449 Chronic obstructive pulmonary disease, unspecified: Secondary | ICD-10-CM | POA: Insufficient documentation

## 2013-09-18 NOTE — Assessment & Plan Note (Signed)
#  COPD - much better on breathing test - continue tudorza once daily; will do prior auth  - continue symbicor  160, 2 puff twice daily  - use alb as needed - flu shot 09/07/2013 #Followup  3months

## 2013-09-18 NOTE — Assessment & Plan Note (Signed)
#  Coronary artery calcification  - finish workup with Dr Jacinto Halim

## 2013-09-18 NOTE — Assessment & Plan Note (Signed)
#  BP - stop lopressor due to copd and per your desire - start losartan 50mg  daily; if expensive call us - monitor BP at home and fu with PCP Irving Copas, MD

## 2013-09-18 NOTE — Assessment & Plan Note (Signed)
#  Lung nodule  - ct scan chest wo contrast 9 months from 09/07/2013

## 2013-09-20 ENCOUNTER — Ambulatory Visit: Payer: Medicare Other | Admitting: Adult Health

## 2014-06-07 ENCOUNTER — Ambulatory Visit (INDEPENDENT_AMBULATORY_CARE_PROVIDER_SITE_OTHER)
Admission: RE | Admit: 2014-06-07 | Discharge: 2014-06-07 | Disposition: A | Discharge status: Home / Self Care | Payer: Medicare Other | Source: Ambulatory Visit | Attending: Internal Medicine | Admitting: Internal Medicine

## 2014-06-07 DIAGNOSIS — R911 Solitary pulmonary nodule: Secondary | ICD-10-CM

## 2014-06-21 ENCOUNTER — Telehealth: Payer: Self-pay | Admitting: Internal Medicine

## 2014-06-21 NOTE — Telephone Encounter (Signed)
Waiting on forms to be faxed. Will hold in triage.

## 2014-06-28 NOTE — Telephone Encounter (Signed)
Marsi reached out to me again-states that the [place] did not need PA but patient wanted the Tier exception. Caprice Renshaw was moved from tier 6 to tier 4 and could not be lowered any more. Pt is aware and nothing more needed at this time.

## 2014-06-28 NOTE — Telephone Encounter (Signed)
Spoke with Marsi at [place] that we have not received any PA for [place] on patient. Requested she refax to [phone]. Will hold in Triage.

## 2014-07-17 ENCOUNTER — Ambulatory Visit (INDEPENDENT_AMBULATORY_CARE_PROVIDER_SITE_OTHER): Payer: Medicare Other | Admitting: Internal Medicine

## 2014-07-17 ENCOUNTER — Encounter: Payer: Self-pay | Admitting: Internal Medicine

## 2014-07-17 VITALS — BP 142/88 | HR 75 | Ht 68.0 in | Wt 182.0 lb

## 2014-07-17 DIAGNOSIS — J449 Chronic obstructive pulmonary disease, unspecified: Secondary | ICD-10-CM

## 2014-07-17 DIAGNOSIS — Z23 Encounter for immunization: Secondary | ICD-10-CM

## 2014-07-17 DIAGNOSIS — R911 Solitary pulmonary nodule: Secondary | ICD-10-CM

## 2014-07-17 NOTE — Addendum Note (Signed)
Addended by: Maurice March on: 07/17/2014 10:42 AM   Modules accepted: Orders

## 2014-07-17 NOTE — Addendum Note (Signed)
Addended by: Maurice March on: 07/17/2014 12:25 PM   Modules accepted: Orders

## 2014-07-17 NOTE — Progress Notes (Signed)
Subjective:    Patient ID: Kelly Delgado, female    DOB: [birth year], 78 y.o.   MRN: [id number]  HPI  IOV 07/07/2013  78 year old female. Here for evaluation for "asthmatic bronchitis" next  At baseline for the last few to several years she's had intermittent episodes of wheezing during rainy season, spring season with pollen exposure, heat and humidity and when she catches her respiratory infection. Usually these episodes are treated with prednisone and antibiotics. She recollects at least 5 courses of prednisone antibiotics in the last several years. In between episodes she is essentially asymptomatic other than mild dyspnea on exertion for class II activities. It is always relieved by rest.  Most recently in early August 2014 she was in [place] and picked up a cold and her wheezing symptoms during a cookout. The next day she was driving back to [facility] and her respirations got worse but she does not position to take immediate help she arrived in [place] and then was so short of breath that she called EMS and was hospitalized from 06/07/2013 2 06/09/2013 and was discharged the diagnosis of either COPD exacerbation vs asthmatic bronchitis exacerbation. It was noted that she was briefly on oxygen during this admission but subsequently he had hypoxemia at rest resolved. Since then she is returned to baseline.  Laboratory 05/28/2013 creatinine 0.9 mg percent, hemoglobin 2.5 g percent., Troponin 0.02 and normal  CXR 06/09/13  - streaky atx at lung base  Spirometry 07/07/2013 - FVC 2.27L/69%, FEv1 1.56L/64%, Ratio 69/94% - c/w RESTRICTION  Walking desaturation test 07/07/2013  - 185 her feet x3 laps on room air : At the end of first lap 88%. At the end of second lap she is 85%. Again the third lap she is 83%. YES DESATURATED  EXposures - reports that she quit smoking about 44 years ago. Her smoking use included Cigarettes. She has a 3.3 pack-year smoking history. She has never used  smokeless tobacco.    OV 07/28/2013 Here to review test results:    ONO 07/11/13: lowest pulse 74%, AW aje oulse ox 96%, Time < 88% is 92 min. ODI 286 with 35 events/hour. She is using O2 but not sure is helping   PFT 07/14/13: FVC 1.94 L/50%. FEV1 1.2 L/48%. Ratio is obstruction and lowered at 63/83%. She is 450 cc bronchodilator response or 36% with FEV1 up at 1.67 L/66%. And pos bronchodilator ratio at 69. Total lung capacity 4.9 L/87% and normal. DLCO 13.5/45% and reduced. Overall she seems to have obstruction with reduced diffusion capacity but total lung capacity is normal raising the question of possible coexistent restriction at a milder level  REC Increase symbicort from 2 puff once daily to  2 puff twice daily Start tudorza 1 puff twice daily (spiriva made her itch) Use albuterl as needed CMA will ensure technique Have CT chest to rule out ILD; will call with results   - if scarring will do autoimmune blood work for those  - if copd/asthma - will add prednisone burst again Followup  Timing Depending on ct results   Telephone call 08/05/13  REviewed ct 08/03/13. Sending to triage due to close of week  1. No ILD. Just asthma/copd (mild emphysema(). So, repeat Take prednisone 40 mg daily x 2 days, then 53m daily x 2 days, then 178mdaily x 2 days, then 40m38maily x 2 days and stop  2. Also, calcium deposit on heart blood vessel. So maybe /maybe not heart vessel blockage. IF she has not  had stress test in past few years, refer to cardiology. Non-urgen but within few weeks - [place] or dr Einar Gip  3. Real small lugn nodules - need followup with CT in 9 months; I will eexplain at fu  4. [facility] FU < 1 month with spirometry at followup  OV 09/07/2013    Followup COPD otherwise specified  - Since starting tudorza and higher dose of symbicort she is somewhat better but not a whole load better. CT scan of the chest only shows mild emphysema and coronary artery calcification. She is  seeing Dr. Tawni Pummel and a cardiac stress test and a resting echo is pending. She feels she'll benefit from pulmonary rehabilitation. Also of note she is very concerned that her metoprolol this contradicted her dyspnea and she wants this changed; this is only for hypertension that were started a few years ago. She says that her distress and worsening after starting metoprolol  There no new issues  SPirometry today fev1 1.8L/78%, RA 76; huge improvement   REC #COPD - much better on breathing test - continue tudorza once daily; will do prior auth  - continue symbicor  160, 2 puff twice daily  - use alb as needed - flu shot 09/07/2013  #Lung nodule  - ct scan chest wo contrast 9 months from 09/07/2013  #Coronary artery calcification  - finish workup with Dr Einar Gip  #BP - stop lopressor due to copd and per your desire - start losartan 57m daily; if expensive [place]- monitor BP at home and fu with PCP TCammy Copa MD  #Followup  356month  OV 07/17/2014  Chief Complaint  Patient presents with  . Follow-up    Pt here to review CT results that were done in 05/2014. Pt states her breathing has improved since last OV. Pt states she is not wheezing as bad as she use to. Pt has been walking several times per week. Pt c/o mild DOE, mild thorat clearing. Pt denies CP/tightness.      FU COPD - gold stage 2 with BD component. On triple Rx. Last seen Nov 2014. Since then doing well. [facility]; does not monitor o2. SHe is not uptodate with vaccines. She is complaiitn with Rx. Walking desaturation test: 185 feet x 3 laps: DID NOT DESATURATE    Lung nodule: scattered nodules stable inNov 2014 and  august 2015. Needs next one in 1 year  Smoking :  reports that she quit smoking about 45 years ago. Her smoking use included Cigarettes. She has a 3.3 pack-year smoking history. She has never used smokeless tobacco.    Immunization History    Administered Date(s) Administered  . Influenza Whole 06/27/2012  . Pneumococcal-Unspecified 06/27/2012      Review of Systems  Constitutional: Negative for fever and unexpected weight change.  HENT: Negative for congestion, dental problem, ear pain, nosebleeds, postnasal drip, rhinorrhea, sinus pressure, sneezing, sore throat and trouble swallowing.   Eyes: Negative for redness and itching.  Respiratory: Positive for shortness of breath. Negative for cough, chest tightness and wheezing.   Cardiovascular: Negative for palpitations and leg swelling.  Gastrointestinal: Negative for nausea and vomiting.  Genitourinary: Negative for dysuria.  Musculoskeletal: Negative for joint swelling.  Skin: Negative for rash.  Neurological: Negative for headaches.  Hematological: Does not bruise/bleed easily.  Psychiatric/Behavioral: Negative for dysphoric mood. The patient is not nervous/anxious.    Current outpatient prescriptions:acetaminophen (TYLENOL) 500 MG tablet, Take 500 mg by mouth  every 6 (six) hours as needed for mild pain., Disp: , Rfl: ;  albuterol (PROAIR HFA) 108 (90 BASE) MCG/ACT inhaler, Inhale 2 puffs into the lungs every 6 (six) hours as needed for wheezing., Disp: , Rfl:  albuterol (PROVENTIL) (2.5 MG/3ML) 0.083% nebulizer solution, Take 3 mL (2.5 mg total) by nebulization every 6 (six) hours as needed for wheezing or shortness of breath (Dispense nebulizer kit with medication)., Disp: 75 mL, Rfl: 12;  Calcium Carb-Cholecalciferol (CALCIUM 600 + D PO), Take 1 tablet by mouth daily., Disp: , Rfl: ;  Cholecalciferol (VITAMIN D-3) 1000 UNITS CAPS, Take 1,000 Units by mouth daily., Disp: , Rfl:  hydrochlorothiazide (HYDRODIURIL) 25 MG tablet, Take 25 mg by mouth daily., Disp: , Rfl: ;  losartan (COZAAR) 50 MG tablet, Take 1 tablet (50 mg total) by mouth daily., Disp: 30 tablet, Rfl: 1;  SYMBICORT 160-4.5 MCG/ACT inhaler, Inhale 2 puffs into the lungs 2 (two) times daily. , Disp: , Rfl: ;   Aclidinium Bromide (TUDORZA PRESSAIR) 400 MCG/ACT AEPB, Inhale 1 puff into the lungs 2 (two) times daily., Disp: 60 each, Rfl: 6     Objective:   Physical Exam  Vitals reviewed. Constitutional: She is oriented to person, place, and time. She appears well-developed and well-nourished. No distress.  HENT:  Head: Normocephalic and atraumatic.  Right Ear: External ear normal.  Left Ear: External ear normal.  Mouth/Throat: Oropharynx is clear and moist. No oropharyngeal exudate.  Eyes: Conjunctivae and EOM are normal. Pupils are equal, round, and reactive to light. Right eye exhibits no discharge. Left eye exhibits no discharge. No scleral icterus.  Neck: Normal range of motion. Neck supple. No JVD present. No tracheal deviation present. No thyromegaly present.  Cardiovascular: Normal rate, regular rhythm, normal heart sounds and intact distal pulses.  Exam reveals no gallop and no friction rub.   No murmur heard. Pulmonary/Chest: Effort normal and breath sounds normal. No respiratory distress. She has no wheezes. She has no rales. She exhibits no tenderness.  Abdominal: Soft. Bowel sounds are normal. She exhibits no distension and no mass. There is no tenderness. There is no rebound and no guarding.  Musculoskeletal: Normal range of motion. She exhibits no edema and no tenderness.  Lymphadenopathy:    She has no cervical adenopathy.  Neurological: She is alert and oriented to person, place, and time. She has normal reflexes. No cranial nerve deficit. She exhibits normal muscle tone. Coordination normal.  Skin: Skin is warm and dry. No rash noted. She is not diaphoretic. No erythema. No pallor.  Psychiatric: She has a normal mood and affect. Her behavior is normal. Judgment and thought content normal.     Filed Vitals:   07/17/14 1010  BP: 142/88  Pulse: 75  Height: '5\' 8"'  (1.727 m)  Weight: 182 lb (82.555 kg)  SpO2: 95%        Assessment & Plan:  #COPD - stable without exertional  desaturation - continue tudorza once daily  - continue symbicort but reduce to 80/4.5,  2 puff twice daily  - use alb as needed - flu shot 07/17/2014 - PREVNAR vaccine 07/17/2014 - [facility]   #Lung nodule  - ct scan chest wo contrast one year from 07/17/2014  #FOllowup  9 months  office spirometry (not tammy wilson) at followup - if well will look at reducing inhalers further

## 2014-07-17 NOTE — Patient Instructions (Addendum)
#  COPD - stable without exertional desaturation - continue tudorza once daily  - continue symbicort but reduce to 80/4.5,  2 puff twice daily  - use alb as needed - flu shot 07/17/2014 - PREVNAR vaccine 07/17/2014 - continue exercise at [facility]   #Lung nodule  - ct scan chest wo contrast one year from 07/17/2014  #FOllowup  9 months  office spirometry (not tammy wilson) at followup - if well will look at reducing inhalers further

## 2014-08-28 ENCOUNTER — Encounter: Payer: Self-pay | Admitting: Internal Medicine

## 2014-10-08 ENCOUNTER — Inpatient Hospital Stay (HOSPITAL_COMMUNITY)
Admission: EM | Admit: 2014-10-08 | Discharge: 2014-10-11 | DRG: 482 | Disposition: A | Discharge status: Home / Self Care | Payer: Medicare Other | Attending: Internal Medicine | Admitting: Internal Medicine

## 2014-10-08 ENCOUNTER — Encounter (HOSPITAL_COMMUNITY): Payer: Self-pay | Admitting: *Deleted

## 2014-10-08 ENCOUNTER — Emergency Department (HOSPITAL_COMMUNITY): Payer: Medicare Other

## 2014-10-08 DIAGNOSIS — M25551 Pain in right hip: Secondary | ICD-10-CM | POA: Diagnosis present

## 2014-10-08 DIAGNOSIS — E860 Dehydration: Secondary | ICD-10-CM | POA: Diagnosis present

## 2014-10-08 DIAGNOSIS — Z87891 Personal history of nicotine dependence: Secondary | ICD-10-CM | POA: Diagnosis not present

## 2014-10-08 DIAGNOSIS — K219 Gastro-esophageal reflux disease without esophagitis: Secondary | ICD-10-CM | POA: Diagnosis not present

## 2014-10-08 DIAGNOSIS — Y9289 Other specified places as the place of occurrence of the external cause: Secondary | ICD-10-CM

## 2014-10-08 DIAGNOSIS — M810 Age-related osteoporosis without current pathological fracture: Secondary | ICD-10-CM | POA: Diagnosis present

## 2014-10-08 DIAGNOSIS — Z7982 Long term (current) use of aspirin: Secondary | ICD-10-CM

## 2014-10-08 DIAGNOSIS — W19XXXA Unspecified fall, initial encounter: Secondary | ICD-10-CM | POA: Diagnosis present

## 2014-10-08 DIAGNOSIS — R7989 Other specified abnormal findings of blood chemistry: Secondary | ICD-10-CM | POA: Diagnosis present

## 2014-10-08 DIAGNOSIS — Z90710 Acquired absence of both cervix and uterus: Secondary | ICD-10-CM | POA: Diagnosis not present

## 2014-10-08 DIAGNOSIS — E876 Hypokalemia: Secondary | ICD-10-CM | POA: Diagnosis not present

## 2014-10-08 DIAGNOSIS — Z888 Allergy status to other drugs, medicaments and biological substances status: Secondary | ICD-10-CM

## 2014-10-08 DIAGNOSIS — J45909 Unspecified asthma, uncomplicated: Secondary | ICD-10-CM

## 2014-10-08 DIAGNOSIS — I10 Essential (primary) hypertension: Secondary | ICD-10-CM

## 2014-10-08 DIAGNOSIS — S72001A Fracture of unspecified part of neck of right femur, initial encounter for closed fracture: Secondary | ICD-10-CM | POA: Diagnosis not present

## 2014-10-08 DIAGNOSIS — K449 Diaphragmatic hernia without obstruction or gangrene: Secondary | ICD-10-CM | POA: Diagnosis present

## 2014-10-08 DIAGNOSIS — S72009A Fracture of unspecified part of neck of unspecified femur, initial encounter for closed fracture: Secondary | ICD-10-CM | POA: Diagnosis present

## 2014-10-08 DIAGNOSIS — T148XXA Other injury of unspecified body region, initial encounter: Secondary | ICD-10-CM

## 2014-10-08 LAB — CBC WITH DIFFERENTIAL/PLATELET
BASOS ABS: 0 10*3/uL (ref 0.0–0.1)
Basophils Relative: 0 % (ref 0–1)
EOS PCT: 8 % — AB (ref 0–5)
Eosinophils Absolute: 0.7 10*3/uL (ref 0.0–0.7)
HCT: 38.4 % (ref 36.0–46.0)
Hemoglobin: 12.8 g/dL (ref 12.0–15.0)
LYMPHS ABS: 1.8 10*3/uL (ref 0.7–4.0)
LYMPHS PCT: 20 % (ref 12–46)
MCH: 29.2 pg (ref 26.0–34.0)
MCHC: 33.3 g/dL (ref 30.0–36.0)
MCV: 87.7 fL (ref 78.0–100.0)
Monocytes Absolute: 0.5 10*3/uL (ref 0.1–1.0)
Monocytes Relative: 6 % (ref 3–12)
NEUTROS PCT: 66 % (ref 43–77)
Neutro Abs: 5.9 10*3/uL (ref 1.7–7.7)
Platelets: 191 10*3/uL (ref 150–400)
RBC: 4.38 MIL/uL (ref 3.87–5.11)
RDW: 13.6 % (ref 11.5–15.5)
WBC: 8.9 10*3/uL (ref 4.0–10.5)

## 2014-10-08 LAB — COMPREHENSIVE METABOLIC PANEL
ALT: 38 U/L — ABNORMAL HIGH (ref 0–35)
AST: 39 U/L — AB (ref 0–37)
Albumin: 3.2 g/dL — ABNORMAL LOW (ref 3.5–5.2)
Alkaline Phosphatase: 240 U/L — ABNORMAL HIGH (ref 39–117)
Anion gap: 13 (ref 5–15)
BUN: 30 mg/dL — ABNORMAL HIGH (ref 6–23)
CALCIUM: 9 mg/dL (ref 8.4–10.5)
CO2: 24 meq/L (ref 19–32)
Chloride: 101 mEq/L (ref 96–112)
Creatinine, Ser: 1.09 mg/dL (ref 0.50–1.10)
GFR calc Af Amer: 56 mL/min — ABNORMAL LOW (ref >90)
GFR, EST NON AFRICAN AMERICAN: 48 mL/min — AB (ref >90)
GLUCOSE: 94 mg/dL (ref 70–99)
POTASSIUM: 3.6 meq/L — AB (ref 3.7–5.3)
SODIUM: 138 meq/L (ref 137–147)
TOTAL PROTEIN: 7 g/dL (ref 6.0–8.3)
Total Bilirubin: 0.3 mg/dL (ref 0.3–1.2)

## 2014-10-08 LAB — URINE MICROSCOPIC-ADD ON

## 2014-10-08 LAB — URINALYSIS, ROUTINE W REFLEX MICROSCOPIC
Bilirubin Urine: NEGATIVE
Glucose, UA: NEGATIVE mg/dL
Hgb urine dipstick: NEGATIVE
Ketones, ur: NEGATIVE mg/dL
Nitrite: NEGATIVE
PH: 7 (ref 5.0–8.0)
Protein, ur: NEGATIVE mg/dL
Specific Gravity, Urine: 1.014 (ref 1.005–1.030)
UROBILINOGEN UA: 0.2 mg/dL (ref 0.0–1.0)

## 2014-10-08 LAB — TYPE AND SCREEN
ABO/RH(D): B POS
ANTIBODY SCREEN: NEGATIVE

## 2014-10-08 MED ORDER — ALBUTEROL SULFATE (2.5 MG/3ML) 0.083% IN NEBU: 2.5000 mg | INHALATION_SOLUTION | Freq: Four times a day (QID) | RESPIRATORY_TRACT | Status: DC | PRN

## 2014-10-08 MED ORDER — SENNOSIDES-DOCUSATE SODIUM 8.6-50 MG PO TABS: 1.0000 | ORAL_TABLET | Freq: Every evening | ORAL | Status: DC | PRN

## 2014-10-08 MED ORDER — ONDANSETRON HCL 4 MG/2ML IJ SOLN: 4.0000 mg | Freq: Four times a day (QID) | INTRAMUSCULAR | Status: DC | PRN

## 2014-10-08 MED ORDER — SODIUM CHLORIDE 0.9 % IV SOLN: INTRAVENOUS | Status: DC

## 2014-10-08 MED ORDER — HYDROCODONE-ACETAMINOPHEN 5-325 MG PO TABS
1.0000 | ORAL_TABLET | ORAL | Status: DC | PRN
  Administered 2014-10-09: 1 via ORAL
  Administered 2014-10-09: 2 via ORAL
  Administered 2014-10-10 – 2014-10-11 (×3): 1 via ORAL
  Filled 2014-10-08 (×2): qty 1
  Filled 2014-10-08 (×2): qty 2
  Filled 2014-10-08: qty 1

## 2014-10-08 MED ORDER — MORPHINE SULFATE 2 MG/ML IJ SOLN
2.0000 mg | INTRAMUSCULAR | Status: DC | PRN
  Administered 2014-10-09: 2 mg via INTRAVENOUS
  Filled 2014-10-08: qty 1

## 2014-10-08 MED ORDER — BUDESONIDE-FORMOTEROL FUMARATE 160-4.5 MCG/ACT IN AERO
2.0000 | INHALATION_SPRAY | Freq: Two times a day (BID) | RESPIRATORY_TRACT | Status: DC
  Administered 2014-10-09 – 2014-10-11 (×4): 2 via RESPIRATORY_TRACT
  Filled 2014-10-08: qty 6

## 2014-10-08 MED ORDER — ONDANSETRON HCL 4 MG PO TABS: 4.0000 mg | ORAL_TABLET | Freq: Four times a day (QID) | ORAL | Status: DC | PRN

## 2014-10-08 MED ORDER — ALUM & MAG HYDROXIDE-SIMETH 200-200-20 MG/5ML PO SUSP: 30.0000 mL | Freq: Four times a day (QID) | ORAL | Status: DC | PRN

## 2014-10-08 MED ORDER — FENTANYL CITRATE 0.05 MG/ML IJ SOLN
100.0000 ug | INTRAMUSCULAR | Status: DC | PRN
  Filled 2014-10-08 (×2): qty 2

## 2014-10-08 MED ORDER — LOSARTAN POTASSIUM 50 MG PO TABS
50.0000 mg | ORAL_TABLET | Freq: Every day | ORAL | Status: DC
  Administered 2014-10-09 – 2014-10-10 (×2): 50 mg via ORAL
  Filled 2014-10-08 (×3): qty 1

## 2014-10-08 MED ORDER — ALBUTEROL SULFATE HFA 108 (90 BASE) MCG/ACT IN AERS: 2.0000 | INHALATION_SPRAY | Freq: Four times a day (QID) | RESPIRATORY_TRACT | Status: DC | PRN

## 2014-10-08 MED ORDER — ACETAMINOPHEN 650 MG RE SUPP: 650.0000 mg | Freq: Four times a day (QID) | RECTAL | Status: DC | PRN

## 2014-10-08 MED ORDER — FENTANYL CITRATE 0.05 MG/ML IJ SOLN
100.0000 ug | Freq: Once | INTRAMUSCULAR | Status: AC
  Administered 2014-10-08: 100 ug via INTRAVENOUS
  Filled 2014-10-08: qty 2

## 2014-10-08 MED ORDER — HYDROCHLOROTHIAZIDE 25 MG PO TABS
25.0000 mg | ORAL_TABLET | Freq: Every day | ORAL | Status: DC
  Administered 2014-10-09 – 2014-10-10 (×2): 25 mg via ORAL
  Filled 2014-10-08 (×3): qty 1

## 2014-10-08 MED ORDER — ONDANSETRON HCL 4 MG/2ML IJ SOLN
4.0000 mg | Freq: Once | INTRAMUSCULAR | Status: AC
  Administered 2014-10-08: 4 mg via INTRAVENOUS
  Filled 2014-10-08: qty 2

## 2014-10-08 MED ORDER — ACETAMINOPHEN 325 MG PO TABS: 650.0000 mg | ORAL_TABLET | Freq: Four times a day (QID) | ORAL | Status: DC | PRN

## 2014-10-08 MED ORDER — POTASSIUM CHLORIDE CRYS ER 20 MEQ PO TBCR
40.0000 meq | EXTENDED_RELEASE_TABLET | Freq: Once | ORAL | Status: AC
  Administered 2014-10-09: 40 meq via ORAL
  Filled 2014-10-08: qty 2

## 2014-10-08 MED ORDER — POTASSIUM CHLORIDE IN NACL 20-0.9 MEQ/L-% IV SOLN
INTRAVENOUS | Status: DC
  Filled 2014-10-08 (×3): qty 1000

## 2014-10-08 MED ORDER — ASPIRIN EC 81 MG PO TBEC
81.0000 mg | DELAYED_RELEASE_TABLET | Freq: Every day | ORAL | Status: DC
  Filled 2014-10-08: qty 1

## 2014-10-08 NOTE — ED Notes (Signed)
Dr. Murphy at bedside.

## 2014-10-08 NOTE — ED Provider Notes (Addendum)
CSN: [id number]     Arrival date & time 10/08/14  1734 History   First MD Initiated Contact with Patient 10/08/14 1825     Chief Complaint  Patient presents with  . Fall     (Consider location/radiation/quality/duration/timing/severity/associated sxs/prior Treatment) HPI Kelly Delgado is a 78 y.o. female who was at home, walking, when she accidentally tripped on a decoration, falling and injuring her right hip.  She denies injury to head, neck, back or upper extremities.  She was transferred here by EMS for evaluation.  She denies preceding illnesses.  There's been no fever, chills, nausea, vomiting, weakness or dizziness.    Past Medical History  Diagnosis Date  . Hypertension   . Basal cell carcinoma of breast 1990's?    "left side, burned it off" (06/07/2013)  . Schatzki's ring   . Asthmatic bronchitis   . Shortness of breath     "related to asthmatic bronchitis only" (06/07/2013)  . H/O hiatal hernia   . GERD (gastroesophageal reflux disease)   . Migraines     "once a year usually" (06/07/2013)   Past Surgical History  Procedure Laterality Date  . Abdominal hysterectomy  1980's  . Dilation and curettage of uterus  1980's    "1" (06/07/2013)  . Urethral diverticulum repair  1970's  . Cataract extraction w/ intraocular lens  implant, bilateral Bilateral 1990's  . Esophageal dilation  2012    "just once" (06/07/2013)   Family History  Problem Relation Age of Onset  . Cancer Mother   . Stroke Mother   . Hypertension Mother    History  Substance Use Topics  . Smoking status: Former Smoker -- 0.33 packs/day for 10 years    Types: Cigarettes    Quit date: 11/27/1968  . Smokeless tobacco: Never Used  . Alcohol Use: 0.0 oz/week     Comment: 06/07/2013 "glass of wine q month or 2"    OB History    Gravida Para Term Preterm AB TAB SAB Ectopic Multiple Living   _0 Review of Systems  All other systems reviewed and are negative.     Allergies  Statins  and Spiriva  Home Medications   Prior to Admission medications   Medication Sig Start Date End Date Taking? Authorizing Provider  Aclidinium Bromide (TUDORZA PRESSAIR) 400 MCG/ACT AEPB Inhale 1 puff into the lungs 2 (two) times daily. 09/07/13  Yes Brand Males, MD  albuterol (PROAIR HFA) 108 (90 BASE) MCG/ACT inhaler Inhale 2 puffs into the lungs every 6 (six) hours as needed for wheezing.   Yes Historical Provider, MD  albuterol (PROVENTIL) (2.5 MG/3ML) 0.083% nebulizer solution Take 3 mL (2.5 mg total) by nebulization every 6 (six) hours as needed for wheezing or shortness of breath (Dispense nebulizer kit with medication). 06/09/13  Yes Thurnell Lose, MD  aspirin EC 81 MG tablet Take 81 mg by mouth daily.   Yes Historical Provider, MD  Calcium Carb-Cholecalciferol (CALCIUM 600 + D PO) Take 1 tablet by mouth daily.   Yes Historical Provider, MD  Cholecalciferol (VITAMIN D-3) 1000 UNITS CAPS Take 2,000 Units by mouth daily.    Yes Historical Provider, MD  hydrochlorothiazide (HYDRODIURIL) 25 MG tablet Take 25 mg by mouth daily.   Yes Historical Provider, MD  losartan (COZAAR) 50 MG tablet Take 1 tablet (50 mg total) by mouth daily. 09/07/13  Yes Brand Males, MD  SYMBICORT 160-4.5 MCG/ACT inhaler Inhale 2 puffs  into the lungs 2 (two) times daily.  04/01/13  Yes Historical Provider, MD   BP 182/91 mmHg  Pulse 65  Temp(Src) 97.6 F (36.4 C) (Oral)  Resp 17  SpO2 99% Physical Exam  Constitutional: She is oriented to person, place, and time. She appears well-developed and well-nourished.  HENT:  Head: Normocephalic and atraumatic.  Right Ear: External ear normal.  Left Ear: External ear normal.  No cranial deformity or tenderness.  Eyes: Conjunctivae and EOM are normal. Pupils are equal, round, and reactive to light.  Neck: Normal range of motion and phonation normal. Neck supple.  Cardiovascular: Normal rate, regular rhythm and normal heart sounds.   Pulmonary/Chest: Effort  normal and breath sounds normal. She exhibits no bony tenderness.  Abdominal: Soft. There is no tenderness.  Musculoskeletal:  Right leg shortened, but not significantly internally rotated.  Neuromuscular intact distally in the right foot.  Normal range of motion, arms without pain.  Neurological: She is alert and oriented to person, place, and time. No cranial nerve deficit or sensory deficit. She exhibits normal muscle tone. Coordination normal.  Skin: Skin is warm, dry and intact.  Psychiatric: She has a normal mood and affect. Her behavior is normal. Judgment and thought content normal.  Nursing note and vitals reviewed.   ED Course  Procedures (including critical care time)  Medications  fentaNYL (SUBLIMAZE) injection 100 mcg (not administered)  ondansetron (ZOFRAN) injection 4 mg (not administered)    Patient Vitals for the past 24 hrs:  BP Temp Temp src Pulse Resp SpO2  10/08/14 1930 182/91 mmHg - - 65 17 99 %  10/08/14 1830 143/77 mmHg - - 64 12 100 %  10/08/14 1738 135/78 mmHg 97.6 F (36.4 C) Oral 67 22 97 %    8:59 PM Reevaluation with update and discussion. After initial assessment and treatment, an updated evaluation reveals She is still uncomfortable.. Gatlyn Lipari L   21:30- .  I contacted Dr. Percell Miller, orthopedics, who is currently in the operating room.  He will see the patient as a Optometrist.  He has not confirmed a time for the OR, yet.  9:30 PM-Consult complete with Dr. Wynelle Cleveland. Patient case explained and discussed. She agrees to admit patient for further evaluation and treatment. Call ended at 2140  Labs Review Labs Reviewed  CBC WITH DIFFERENTIAL - Abnormal; Notable for the following:    Eosinophils Relative 8 (*)    All other components within normal limits  COMPREHENSIVE METABOLIC PANEL - Abnormal; Notable for the following:    Potassium 3.6 (*)    BUN 30 (*)    Albumin 3.2 (*)    AST 39 (*)    ALT 38 (*)    Alkaline Phosphatase 240 (*)    GFR calc  non Af Amer 48 (*)    GFR calc Af Amer 56 (*)    All other components within normal limits  URINE CULTURE  URINALYSIS, ROUTINE W REFLEX MICROSCOPIC  TYPE AND SCREEN  ABO/RH    Imaging Review Dg Hip Complete Right  10/08/2014   CLINICAL DATA:  Right hip injury after fall.  EXAM: RIGHT HIP - COMPLETE 2+ VIEW  COMPARISON:  CT scan of September 09, 2013  FINDINGS: Shortening of the right femoral neck is noted with cortical regularity noted superiorly and inferiorly suggesting mildly displaced proximal right femoral neck fracture. No dislocation is noted. Mild degenerative change of both hip joints is noted.  IMPRESSION: Findings consistent with mildly displaced proximal right femoral neck fracture.  Electronically Signed   By: Sabino Dick M.D.   On: 10/08/2014 19:22     EKG Interpretation   Date/Time:  Sunday October 08 2014 18:36:51 EST Ventricular Rate:  64 PR Interval:  155 QRS Duration: 94 QT Interval:  415 QTC Calculation: 428 R Axis:   -26 Text Interpretation:  Sinus rhythm Borderline left axis deviation Low  voltage, precordial leads Abnormal R-wave progression, early transition  Borderline repolarization abnormality No old tracing to compare Confirmed  by [facility]  MD, Bodhi Moradi [phone]) on 10/08/2014 6:50:18 PM      MDM   Final diagnoses:  Hip fracture, right, closed, initial encounter    Apparent mechanical fall, with isolated injury to right hip.  Will require admission for treatment and operative repair of the right hip fracture.  Screening for severe metabolic or infectious processes, are negative.  She has mild nonspecific elevation of liver function tests.  BUN is minimally elevated.  Nursing Notes Reviewed/ Care Coordinated Applicable Imaging Reviewed Interpretation of Laboratory Data incorporated into ED treatment  Plan: Admit   Richarda Blade, MD 10/08/14 [place], MD 10/23/14 1626

## 2014-10-08 NOTE — ED Notes (Signed)
Pt states that she was pulling up christmas decorations when she tripped and landed on her rt hip. Pt states that she is unable to bear weigh to the rt leg. CMS intact bilaterally.

## 2014-10-08 NOTE — ED Notes (Signed)
Dr. Wentz at bedside. 

## 2014-10-08 NOTE — Consult Note (Signed)
ORTHOPAEDIC CONSULTATION  REQUESTING PHYSICIAN: Richarda Blade, MD  Chief Complaint: right femoral neck fracture impacted  HPI: Kelly Delgado is a 78 y.o. female who complains of a mechanical fall onto the R side, She does not normally use a cane or walker.  Past Medical History  Diagnosis Date  . Hypertension   . Basal cell carcinoma of breast 1990's?    "left side, burned it off" (06/07/2013)  . Schatzki's ring   . Asthmatic bronchitis   . Shortness of breath     "related to asthmatic bronchitis only" (06/07/2013)  . H/O hiatal hernia   . GERD (gastroesophageal reflux disease)   . Migraines     "once a year usually" (06/07/2013)   Past Surgical History  Procedure Laterality Date  . Abdominal hysterectomy  1980's  . Dilation and curettage of uterus  1980's    "1" (06/07/2013)  . Urethral diverticulum repair  1970's  . Cataract extraction w/ intraocular lens  implant, bilateral Bilateral 1990's  . Esophageal dilation  2012    "just once" (06/07/2013)   History   Social History  . Marital Status: Married    Spouse Name: N/A    Number of Children: N/A  . Years of Education: N/A   Social History Main Topics  . Smoking status: Former Smoker -- 0.33 packs/day for 10 years    Types: Cigarettes    Quit date: 11/27/1968  . Smokeless tobacco: Never Used  . Alcohol Use: 0.0 oz/week     Comment: 06/07/2013 "glass of wine q month or 2"   . Drug Use: No  . Sexual Activity: Yes   Other Topics Concern  . None   Social History Narrative   Family History  Problem Relation Age of Onset  . Cancer Mother   . Stroke Mother   . Hypertension Mother    Allergies  Allergen Reactions  . Statins Other (See Comments)    Increased LFT's  . Spiriva [Tiotropium Bromide Monohydrate] Itching   Prior to Admission medications   Medication Sig Start Date End Date Taking? Authorizing Provider  Aclidinium Bromide (TUDORZA PRESSAIR) 400 MCG/ACT AEPB Inhale 1 puff into the lungs 2  (two) times daily. 09/07/13  Yes Brand Males, MD  albuterol (PROAIR HFA) 108 (90 BASE) MCG/ACT inhaler Inhale 2 puffs into the lungs every 6 (six) hours as needed for wheezing.   Yes Historical Provider, MD  albuterol (PROVENTIL) (2.5 MG/3ML) 0.083% nebulizer solution Take 3 mL (2.5 mg total) by nebulization every 6 (six) hours as needed for wheezing or shortness of breath (Dispense nebulizer kit with medication). 06/09/13  Yes Thurnell Lose, MD  aspirin EC 81 MG tablet Take 81 mg by mouth daily.   Yes Historical Provider, MD  Calcium Carb-Cholecalciferol (CALCIUM 600 + D PO) Take 1 tablet by mouth daily.   Yes Historical Provider, MD  Cholecalciferol (VITAMIN D-3) 1000 UNITS CAPS Take 2,000 Units by mouth daily.    Yes Historical Provider, MD  hydrochlorothiazide (HYDRODIURIL) 25 MG tablet Take 25 mg by mouth daily.   Yes Historical Provider, MD  losartan (COZAAR) 50 MG tablet Take 1 tablet (50 mg total) by mouth daily. 09/07/13  Yes Brand Males, MD  SYMBICORT 160-4.5 MCG/ACT inhaler Inhale 2 puffs into the lungs 2 (two) times daily.  04/01/13  Yes Historical Provider, MD   Dg Hip Complete Right  10/08/2014   CLINICAL DATA:  Right hip injury after fall.  EXAM: RIGHT HIP - COMPLETE 2+ VIEW  COMPARISON:  CT scan of September 09, 2013  FINDINGS: Shortening of the right femoral neck is noted with cortical regularity noted superiorly and inferiorly suggesting mildly displaced proximal right femoral neck fracture. No dislocation is noted. Mild degenerative change of both hip joints is noted.  IMPRESSION: Findings consistent with mildly displaced proximal right femoral neck fracture.   Electronically Signed   By: Sabino Dick M.D.   On: 10/08/2014 19:22    Positive ROS: All other systems have been reviewed and were otherwise negative with the exception of those mentioned in the HPI and as above.  Labs cbc  Recent Labs  10/08/14 1850  WBC 8.9  HGB 12.8  HCT 38.4  PLT 191    Labs  inflam No results for input(s): CRP in the last 72 hours.  Invalid input(s): ESR  Labs coag No results for input(s): INR, PTT in the last 72 hours.  Invalid input(s): PT   Recent Labs  10/08/14 1850  NA 138  K 3.6*  CL 101  CO2 24  GLUCOSE 94  BUN 30*  CREATININE 1.09  CALCIUM 9.0    Physical Exam: Filed Vitals:   10/08/14 2129  BP: 122/87  Pulse: 69  Temp:   Resp: 18   General: Alert, no acute distress Cardiovascular: No pedal edema Respiratory: No cyanosis, no use of accessory musculature GI: No organomegaly, abdomen is soft and non-tender Skin: No lesions in the area of chief complaint other than those listed below in MSK exam.  Neurologic: Sensation intact distally Psychiatric: Patient is competent for consent with normal mood and affect Lymphatic: No axillary or cervical lymphadenopathy  MUSCULOSKELETAL:  RLE: skin benign, SILT DP/SP/S/S/T nerve, 2+ DP, +TA/GS/EHL Compartments soft   Other extremities are atraumatic with painless ROM and NVI.  Assessment: R femoral neck fracture  Plan: Hip pinning in the afternoon tomorrow(monday) Weight Bearing Status: Bedrest for now, WBAT post op PT VTE px: SCD's and Chemical px post op, please hold any tomorrow   Edmonia Lynch, D, MD Cell [phone]   10/08/2014 9:52 PM

## 2014-10-08 NOTE — ED Notes (Signed)
Bp cuff removed from patient by MD; bruising noted to R upper arm from cuff

## 2014-10-08 NOTE — H&P (Addendum)
[facility] History and Physical  Kelly Delgado WNU:[id number] DOB: 21-Aug-1936 DOA: 10/08/2014   PCP: Cammy Copa, MD    Chief Complaint: fall, right hip/groin pain and inability to walk  HPI: Kelly Delgado is a 78 y.o. female with hypertension, asthma and gastroesophageal reflux disease who presents after a mechanical fall with a right femoral neck fracture. She is currently complaining of mild pain but was having a significant amount of pain after the fall and was unable to walk. She has no other complaints. Specifically has no complaints of chest pain, chest tightness or shortness of breath at rest or with exertion.   General: The patient denies anorexia, fever, weight loss Cardiac: Denies chest pain, syncope, palpitations, pedal edema  Respiratory: Denies cough, shortness of breath, wheezing- asthma symptoms controlled GI: Denies severe indigestion/heartburn, abdominal pain, nausea, vomiting, diarrhea and constipation GU: Denies hematuria, incontinence, dysuria  Musculoskeletal: Denies arthritis  Skin: Denies suspicious skin lesions Neurologic: Denies focal weakness or numbness, change in vision Psychiatry: Denies depression or anxiety.   Past Medical History  Diagnosis Date  . Hypertension   . Basal cell carcinoma of breast 1990's?    "left side, burned it off" (06/07/2013)  . Schatzki's ring   . Asthmatic bronchitis   . H/O hiatal hernia   . GERD (gastroesophageal reflux disease)   . Migraines     "once a year usually" (06/07/2013)    Past Surgical History  Procedure Laterality Date  . Abdominal hysterectomy  1980's  . Dilation and curettage of uterus  1980's    "1" (06/07/2013)  . Urethral diverticulum repair  1970's  . Cataract extraction w/ intraocular lens  implant, bilateral Bilateral 1990's  . Esophageal dilation  2012    "just once" (06/07/2013)    Social History: does not smoke cigarettes, drinks an occasional glass of wine Lives at home with  husband and granddaughter   Allergies  Allergen Reactions  . Statins Other (See Comments)    Increased LFT's  . Spiriva [Tiotropium Bromide Monohydrate] Itching    Family History  Problem Relation Age of Onset  . Cancer Mother   . Stroke Mother   . Hypertension Mother       Prior to Admission medications   Medication Sig Start Date End Date Taking? Authorizing Provider  Aclidinium Bromide (TUDORZA PRESSAIR) 400 MCG/ACT AEPB Inhale 1 puff into the lungs 2 (two) times daily. 09/07/13  Yes Brand Males, MD  albuterol (PROAIR HFA) 108 (90 BASE) MCG/ACT inhaler Inhale 2 puffs into the lungs every 6 (six) hours as needed for wheezing.   Yes Historical Provider, MD  albuterol (PROVENTIL) (2.5 MG/3ML) 0.083% nebulizer solution Take 3 mL (2.5 mg total) by nebulization every 6 (six) hours as needed for wheezing or shortness of breath (Dispense nebulizer kit with medication). 06/09/13  Yes Thurnell Lose, MD  aspirin EC 81 MG tablet Take 81 mg by mouth daily.   Yes Historical Provider, MD  Calcium Carb-Cholecalciferol (CALCIUM 600 + D PO) Take 1 tablet by mouth daily.   Yes Historical Provider, MD  Cholecalciferol (VITAMIN D-3) 1000 UNITS CAPS Take 2,000 Units by mouth daily.    Yes Historical Provider, MD  hydrochlorothiazide (HYDRODIURIL) 25 MG tablet Take 25 mg by mouth daily.   Yes Historical Provider, MD  losartan (COZAAR) 50 MG tablet Take 1 tablet (50 mg total) by mouth daily. 09/07/13  Yes Brand Males, MD  SYMBICORT 160-4.5 MCG/ACT inhaler Inhale 2 puffs into the lungs 2 (two) times daily.  04/01/13  Yes Historical Provider, MD     Physical Exam: Filed Vitals:   10/08/14 2100 10/08/14 2110 10/08/14 2129 10/08/14 2200  BP: 179/86 179/86 122/87 137/98  Pulse: 73 70 69 77  Temp:      TempSrc:      Resp: '21 16 18 16  ' SpO2: 97% 98% 96% 97%     General: Awake alert oriented 3, no acute distress HEENT: Normocephalic and Atraumatic, Mucous membranes pink and dry                 PERRLA; EOM intact; No scleral icterus,                 Nares: Patent, Oropharynx: Clear, Fair Dentition                 Neck: FROM, no cervical lymphadenopathy, thyromegaly, carotid bruit or JVD;  Breasts: deferred CHEST WALL: No tenderness  CHEST: Normal respiration, clear to auscultation bilaterally  HEART: Regular rate and rhythm; no murmurs rubs or gallops  BACK: No kyphosis or scoliosis; no CVA tenderness  ABDOMEN: Positive Bowel Sounds, soft, non-tender; no masses, no organomegaly Rectal Exam: deferred EXTREMITIES: No cyanosis, clubbing, or edema Genitalia: not examined  SKIN:  no rash or ulceration  CNS: Alert and Oriented x 4, Nonfocal exam, CN 2-12 intact  Labs on Admission:  Basic Metabolic Panel:  Recent Labs Lab 10/08/14 1850  NA 138  K 3.6*  CL 101  CO2 24  GLUCOSE 94  BUN 30*  CREATININE 1.09  CALCIUM 9.0   Liver Function Tests:  Recent Labs Lab 10/08/14 1850  AST 39*  ALT 38*  ALKPHOS 240*  BILITOT 0.3  PROT 7.0  ALBUMIN 3.2*   No results for input(s): LIPASE, AMYLASE in the last 168 hours. No results for input(s): AMMONIA in the last 168 hours. CBC:  Recent Labs Lab 10/08/14 1850  WBC 8.9  NEUTROABS 5.9  HGB 12.8  HCT 38.4  MCV 87.7  PLT 191   Cardiac Enzymes: No results for input(s): CKTOTAL, CKMB, CKMBINDEX, TROPONINI in the last 168 hours.  BNP (last 3 results) No results for input(s): PROBNP in the last 8760 hours. CBG: No results for input(s): GLUCAP in the last 168 hours.  Radiological Exams on Admission: Dg Hip Complete Right  10/08/2014   CLINICAL DATA:  Right hip injury after fall.  EXAM: RIGHT HIP - COMPLETE 2+ VIEW  COMPARISON:  CT scan of September 09, 2013  FINDINGS: Shortening of the right femoral neck is noted with cortical regularity noted superiorly and inferiorly suggesting mildly displaced proximal right femoral neck fracture. No dislocation is noted. Mild degenerative change of both hip joints is noted.   IMPRESSION: Findings consistent with mildly displaced proximal right femoral neck fracture.   Electronically Signed   By: Sabino Dick M.D.   On: 10/08/2014 19:22    EKG: Independently reviewed. 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 Eulis Foster has spoken with Dr. Percell Miller  Code Status: Full code  Family Communication: Husband-Felix Mcgillicuddy-numbers on face sheet DVT Prophylaxis: To be determined by ortho  Time spent: 12 minutes  [place], MD [facility]  If 7PM-7AM, please contact night-coverage [email or web address] 10/08/2014, 10:38 PM

## 2014-10-08 NOTE — ED Notes (Signed)
Pt monitored by pulse ox, bp cuff, and 12-lead. 

## 2014-10-09 ENCOUNTER — Inpatient Hospital Stay (HOSPITAL_COMMUNITY): Payer: Medicare Other | Admitting: Anesthesiology

## 2014-10-09 ENCOUNTER — Inpatient Hospital Stay (HOSPITAL_COMMUNITY): Payer: Medicare Other

## 2014-10-09 ENCOUNTER — Encounter (HOSPITAL_COMMUNITY)
Admission: EM | Disposition: A | Discharge status: Home / Self Care | Payer: Self-pay | Source: Home / Self Care | Attending: Internal Medicine

## 2014-10-09 ENCOUNTER — Encounter (HOSPITAL_COMMUNITY): Payer: Self-pay | Admitting: Anesthesiology

## 2014-10-09 HISTORY — PX: HIP PINNING,CANNULATED: SHX1758

## 2014-10-09 LAB — CBC
HCT: 36.5 % (ref 36.0–46.0)
HEMOGLOBIN: 12 g/dL (ref 12.0–15.0)
MCH: 29 pg (ref 26.0–34.0)
MCHC: 32.9 g/dL (ref 30.0–36.0)
MCV: 88.2 fL (ref 78.0–100.0)
Platelets: 171 10*3/uL (ref 150–400)
RBC: 4.14 MIL/uL (ref 3.87–5.11)
RDW: 13.5 % (ref 11.5–15.5)
WBC: 9 10*3/uL (ref 4.0–10.5)

## 2014-10-09 LAB — BASIC METABOLIC PANEL
Anion gap: 12 (ref 5–15)
BUN: 24 mg/dL — ABNORMAL HIGH (ref 6–23)
CHLORIDE: 104 meq/L (ref 96–112)
CO2: 24 mEq/L (ref 19–32)
Calcium: 8.8 mg/dL (ref 8.4–10.5)
Creatinine, Ser: 0.92 mg/dL (ref 0.50–1.10)
GFR calc non Af Amer: 59 mL/min — ABNORMAL LOW (ref >90)
GFR, EST AFRICAN AMERICAN: 68 mL/min — AB (ref >90)
Glucose, Bld: 92 mg/dL (ref 70–99)
POTASSIUM: 4.1 meq/L (ref 3.7–5.3)
Sodium: 140 mEq/L (ref 137–147)

## 2014-10-09 LAB — MRSA PCR SCREENING: MRSA BY PCR: NEGATIVE

## 2014-10-09 LAB — ABO/RH: ABO/RH(D): B POS

## 2014-10-09 SURGERY — FIXATION, FEMUR, NECK, PERCUTANEOUS, USING SCREW
Anesthesia: Monitor Anesthesia Care | Site: Hip | Laterality: Right

## 2014-10-09 MED ORDER — LIDOCAINE HCL (CARDIAC) 20 MG/ML IV SOLN
INTRAVENOUS | Status: DC | PRN
  Administered 2014-10-09: 20 mg via INTRAVENOUS

## 2014-10-09 MED ORDER — FENTANYL CITRATE 0.05 MG/ML IJ SOLN
INTRAMUSCULAR | Status: AC
  Filled 2014-10-09: qty 5

## 2014-10-09 MED ORDER — ROCURONIUM BROMIDE 50 MG/5ML IV SOLN
INTRAVENOUS | Status: AC
  Filled 2014-10-09: qty 1

## 2014-10-09 MED ORDER — FENTANYL CITRATE 0.05 MG/ML IJ SOLN
25.0000 ug | INTRAMUSCULAR | Status: DC | PRN
Start: 2014-10-09 — End: 2014-10-09
  Administered 2014-10-09 (×3): 50 ug via INTRAVENOUS

## 2014-10-09 MED ORDER — PROPOFOL 10 MG/ML IV BOLUS
INTRAVENOUS | Status: DC | PRN
  Administered 2014-10-09: 20 mg via INTRAVENOUS

## 2014-10-09 MED ORDER — SUCCINYLCHOLINE CHLORIDE 20 MG/ML IJ SOLN
INTRAMUSCULAR | Status: AC
  Filled 2014-10-09: qty 1

## 2014-10-09 MED ORDER — MIDAZOLAM HCL 5 MG/5ML IJ SOLN
INTRAMUSCULAR | Status: DC | PRN
  Administered 2014-10-09: 2 mg via INTRAVENOUS

## 2014-10-09 MED ORDER — METOCLOPRAMIDE HCL 10 MG PO TABS: 5.0000 mg | ORAL_TABLET | Freq: Three times a day (TID) | ORAL | Status: DC | PRN

## 2014-10-09 MED ORDER — PHENOL 1.4 % MT LIQD: 1.0000 | OROMUCOSAL | Status: DC | PRN

## 2014-10-09 MED ORDER — ONDANSETRON HCL 4 MG/2ML IJ SOLN
INTRAMUSCULAR | Status: AC
  Filled 2014-10-09: qty 2

## 2014-10-09 MED ORDER — SODIUM CHLORIDE 0.9 % IR SOLN
Status: DC | PRN
  Administered 2014-10-09: 1

## 2014-10-09 MED ORDER — LACTATED RINGERS IV SOLN
INTRAVENOUS | Status: DC | PRN
  Administered 2014-10-09: 17:00:00 via INTRAVENOUS

## 2014-10-09 MED ORDER — FENTANYL CITRATE 0.05 MG/ML IJ SOLN
INTRAMUSCULAR | Status: AC
  Filled 2014-10-09: qty 2

## 2014-10-09 MED ORDER — PHENYLEPHRINE HCL 10 MG/ML IJ SOLN
INTRAMUSCULAR | Status: DC | PRN
  Administered 2014-10-09 (×5): 80 ug via INTRAVENOUS

## 2014-10-09 MED ORDER — CEFAZOLIN SODIUM-DEXTROSE 2-3 GM-% IV SOLR
INTRAVENOUS | Status: DC | PRN
  Administered 2014-10-09: 2 g via INTRAVENOUS

## 2014-10-09 MED ORDER — FENTANYL CITRATE 0.05 MG/ML IJ SOLN
INTRAMUSCULAR | Status: DC | PRN
  Administered 2014-10-09 (×2): 25 ug via INTRAVENOUS

## 2014-10-09 MED ORDER — ASPIRIN EC 325 MG PO TBEC: 325.0000 mg | DELAYED_RELEASE_TABLET | Freq: Every day | ORAL | Status: DC

## 2014-10-09 MED ORDER — ASPIRIN EC 325 MG PO TBEC
325.0000 mg | DELAYED_RELEASE_TABLET | Freq: Every day | ORAL | Status: DC
  Administered 2014-10-10: 325 mg via ORAL
  Filled 2014-10-09 (×4): qty 1

## 2014-10-09 MED ORDER — METOCLOPRAMIDE HCL 5 MG/ML IJ SOLN: 5.0000 mg | Freq: Three times a day (TID) | INTRAMUSCULAR | Status: DC | PRN

## 2014-10-09 MED ORDER — DEXTROSE-NACL 5-0.45 % IV SOLN
INTRAVENOUS | Status: DC
  Administered 2014-10-09 – 2014-10-10 (×3): via INTRAVENOUS

## 2014-10-09 MED ORDER — PROPOFOL 10 MG/ML IV BOLUS
INTRAVENOUS | Status: AC
  Filled 2014-10-09: qty 20

## 2014-10-09 MED ORDER — DOCUSATE SODIUM 100 MG PO CAPS: 100.0000 mg | ORAL_CAPSULE | Freq: Two times a day (BID) | ORAL | Status: DC

## 2014-10-09 MED ORDER — MIDAZOLAM HCL 2 MG/2ML IJ SOLN
INTRAMUSCULAR | Status: AC
  Filled 2014-10-09: qty 2

## 2014-10-09 MED ORDER — BUPIVACAINE HCL (PF) 0.25 % IJ SOLN
INTRAMUSCULAR | Status: AC
  Filled 2014-10-09: qty 30

## 2014-10-09 MED ORDER — ONDANSETRON HCL 4 MG/2ML IJ SOLN: 4.0000 mg | Freq: Once | INTRAMUSCULAR | Status: DC | PRN

## 2014-10-09 MED ORDER — HYDROCODONE-ACETAMINOPHEN 5-325 MG PO TABS: 1.0000 | ORAL_TABLET | ORAL | Status: DC | PRN

## 2014-10-09 MED ORDER — CEFAZOLIN SODIUM-DEXTROSE 2-3 GM-% IV SOLR
2.0000 g | Freq: Four times a day (QID) | INTRAVENOUS | Status: AC
  Administered 2014-10-09 – 2014-10-10 (×2): 2 g via INTRAVENOUS
  Filled 2014-10-09 (×2): qty 50

## 2014-10-09 MED ORDER — PROPOFOL INFUSION 10 MG/ML OPTIME
INTRAVENOUS | Status: DC | PRN
  Administered 2014-10-09: 75 ug/kg/min via INTRAVENOUS

## 2014-10-09 MED ORDER — MENTHOL 3 MG MT LOZG
1.0000 | LOZENGE | OROMUCOSAL | Status: DC | PRN
Start: 2014-10-09 — End: 2014-10-11

## 2014-10-09 MED ORDER — EPHEDRINE SULFATE 50 MG/ML IJ SOLN
INTRAMUSCULAR | Status: DC | PRN
  Administered 2014-10-09: 5 mg via INTRAVENOUS
  Administered 2014-10-09: 10 mg via INTRAVENOUS

## 2014-10-09 MED ORDER — ONDANSETRON HCL 4 MG/2ML IJ SOLN
INTRAMUSCULAR | Status: DC | PRN
  Administered 2014-10-09: 4 mg via INTRAVENOUS

## 2014-10-09 SURGICAL SUPPLY — 52 items
BIT DRILL 4.9 CANNULATED (BIT) ×1
BIT DRILL CANN QC 4.9 LRG (BIT) IMPLANT
BNDG COHESIVE 4X5 TAN STRL (GAUZE/BANDAGES/DRESSINGS) ×4 IMPLANT
CATH FOLEY 2WAY SLVR  5CC 12FR (CATHETERS) ×2
CATH FOLEY 2WAY SLVR 5CC 12FR (CATHETERS) IMPLANT
CLOSURE STERI-STRIP 1/2X4 (GAUZE/BANDAGES/DRESSINGS) ×1
CLSR STERI-STRIP ANTIMIC 1/2X4 (GAUZE/BANDAGES/DRESSINGS) ×1 IMPLANT
COVER PERINEAL POST (MISCELLANEOUS) ×3 IMPLANT
COVER SURGICAL LIGHT HANDLE (MISCELLANEOUS) ×3 IMPLANT
DRAPE IMP U-DRAPE 54X76 (DRAPES) ×3 IMPLANT
DRAPE STERI IOBAN 125X83 (DRAPES) ×3 IMPLANT
DRILL BIT CANNULATED 4.9 (BIT) ×3
DRSG EMULSION OIL 3X3 NADH (GAUZE/BANDAGES/DRESSINGS) ×1 IMPLANT
DRSG MEPILEX BORDER 4X4 (GAUZE/BANDAGES/DRESSINGS) ×2 IMPLANT
DRSG TEGADERM 4X4.75 (GAUZE/BANDAGES/DRESSINGS) ×1 IMPLANT
DURAPREP 26ML APPLICATOR (WOUND CARE) ×3 IMPLANT
ELECT REM PT RETURN 9FT ADLT (ELECTROSURGICAL) ×3
ELECTRODE REM PT RTRN 9FT ADLT (ELECTROSURGICAL) ×1 IMPLANT
GAUZE SPONGE 4X4 12PLY STRL (GAUZE/BANDAGES/DRESSINGS) ×1 IMPLANT
GLOVE BIO SURGEON STRL SZ7.5 (GLOVE) ×4 IMPLANT
GLOVE BIOGEL PI IND STRL 6.5 (GLOVE) IMPLANT
GLOVE BIOGEL PI IND STRL 8 (GLOVE) ×1 IMPLANT
GLOVE BIOGEL PI INDICATOR 6.5 (GLOVE) ×6
GLOVE BIOGEL PI INDICATOR 8 (GLOVE) ×2
GLOVE BIOGEL PI ORTHO PRO SZ8 (GLOVE) ×2
GLOVE PI ORTHO PRO STRL SZ8 (GLOVE) IMPLANT
GLOVE SURG ORTHO 8.0 STRL STRW (GLOVE) ×2 IMPLANT
GLOVE SURG SS PI 6.5 STRL IVOR (GLOVE) ×2 IMPLANT
GOWN STRL REUS W/ TWL LRG LVL3 (GOWN DISPOSABLE) ×1 IMPLANT
GOWN STRL REUS W/ TWL XL LVL3 (GOWN DISPOSABLE) IMPLANT
GOWN STRL REUS W/TWL LRG LVL3 (GOWN DISPOSABLE) ×6
GOWN STRL REUS W/TWL XL LVL3 (GOWN DISPOSABLE) ×3
GUIDEWIRE THRD ASNIS 3.2X300 (WIRE) ×6 IMPLANT
KIT BASIN OR (CUSTOM PROCEDURE TRAY) ×3 IMPLANT
KIT ROOM TURNOVER OR (KITS) ×3 IMPLANT
LINER BOOT UNIVERSAL DISP (MISCELLANEOUS) ×3 IMPLANT
MANIFOLD NEPTUNE II (INSTRUMENTS) ×1 IMPLANT
NS IRRIG 1000ML POUR BTL (IV SOLUTION) ×3 IMPLANT
PACK GENERAL/GYN (CUSTOM PROCEDURE TRAY) ×3 IMPLANT
PAD ARMBOARD 7.5X6 YLW CONV (MISCELLANEOUS) ×6 IMPLANT
SCREW ASNIS 90MM (Screw) ×1 IMPLANT
SCREW ASNIS 95MM (Screw) ×2 IMPLANT
STAPLER VISISTAT 35W (STAPLE) ×3 IMPLANT
SUT MON AB 2-0 CT1 36 (SUTURE) ×3 IMPLANT
SUT VIC AB 0 CT1 27 (SUTURE) ×3
SUT VIC AB 0 CT1 27XBRD ANBCTR (SUTURE) ×1 IMPLANT
TOWEL OR 17X24 6PK STRL BLUE (TOWEL DISPOSABLE) ×1 IMPLANT
TOWEL OR 17X26 10 PK STRL BLUE (TOWEL DISPOSABLE) ×3 IMPLANT
TOWEL OR NON WOVEN STRL DISP B (DISPOSABLE) ×3 IMPLANT
TRAY FOLEY CATH 14FRSI W/METER (CATHETERS) ×2 IMPLANT
TRAY FOLEY CATH 16FRSI W/METER (SET/KITS/TRAYS/PACK) ×2 IMPLANT
WATER STERILE IRR 1000ML POUR (IV SOLUTION) ×1 IMPLANT

## 2014-10-09 NOTE — Interval H&P Note (Signed)
History and Physical Interval Note:  10/09/2014 7:58 AM  Kelly Delgado  has presented today for surgery, with the diagnosis of Right hip fracture  The various methods of treatment have been discussed with the patient and family. After consideration of risks, benefits and other options for treatment, the patient has consented to  Procedure(s): CANNULATED HIP PINNING (Right) as a surgical intervention .  The patient's history has been reviewed, patient examined, no change in status, stable for surgery.  I have reviewed the patient's chart and labs.  Questions were answered to the patient's satisfaction.     Evaleen Sant, D

## 2014-10-09 NOTE — Transfer of Care (Signed)
Immediate Anesthesia Transfer of Care Note  Patient: Kelly Delgado  Procedure(s) Performed: Procedure(s): CANNULATED HIP PINNING (Right)  Patient Location: PACU  Anesthesia Type:MAC and Spinal  Level of Consciousness: awake, alert , oriented and patient cooperative  Airway & Oxygen Therapy: Patient Spontanous Breathing and Patient connected to nasal cannula oxygen  Post-op Assessment: Report given to PACU RN and Post -op Vital signs reviewed and stable  Post vital signs: Reviewed and stable  Complications: No apparent anesthesia complications

## 2014-10-09 NOTE — H&P (View-Only) (Signed)
   ORTHOPAEDIC CONSULTATION  REQUESTING PHYSICIAN: Elliott L Wentz, MD  Chief Complaint: right femoral neck fracture impacted  HPI: Kelly Delgado is a 78 y.o. female who complains of a mechanical fall onto the R side, She does not normally use a cane or walker.  Past Medical History  Diagnosis Date  . Hypertension   . Basal cell carcinoma of breast 1990's?    "left side, burned it off" (06/07/2013)  . Schatzki's ring   . Asthmatic bronchitis   . Shortness of breath     "related to asthmatic bronchitis only" (06/07/2013)  . H/O hiatal hernia   . GERD (gastroesophageal reflux disease)   . Migraines     "once a year usually" (06/07/2013)   Past Surgical History  Procedure Laterality Date  . Abdominal hysterectomy  1980's  . Dilation and curettage of uterus  1980's    "1" (06/07/2013)  . Urethral diverticulum repair  1970's  . Cataract extraction w/ intraocular lens  implant, bilateral Bilateral 1990's  . Esophageal dilation  2012    "just once" (06/07/2013)   History   Social History  . Marital Status: Married    Spouse Name: N/A    Number of Children: N/A  . Years of Education: N/A   Social History Main Topics  . Smoking status: Former Smoker -- 0.33 packs/day for 10 years    Types: Cigarettes    Quit date: 11/27/1968  . Smokeless tobacco: Never Used  . Alcohol Use: 0.0 oz/week     Comment: 06/07/2013 "glass of wine q month or 2"   . Drug Use: No  . Sexual Activity: Yes   Other Topics Concern  . None   Social History Narrative   Family History  Problem Relation Age of Onset  . Cancer Mother   . Stroke Mother   . Hypertension Mother    Allergies  Allergen Reactions  . Statins Other (See Comments)    Increased LFT's  . Spiriva [Tiotropium Bromide Monohydrate] Itching   Prior to Admission medications   Medication Sig Start Date End Date Taking? Authorizing Provider  Aclidinium Bromide (TUDORZA PRESSAIR) 400 MCG/ACT AEPB Inhale 1 puff into the lungs 2  (two) times daily. 09/07/13  Yes Murali Ramaswamy, MD  albuterol (PROAIR HFA) 108 (90 BASE) MCG/ACT inhaler Inhale 2 puffs into the lungs every 6 (six) hours as needed for wheezing.   Yes Historical Provider, MD  albuterol (PROVENTIL) (2.5 MG/3ML) 0.083% nebulizer solution Take 3 mL (2.5 mg total) by nebulization every 6 (six) hours as needed for wheezing or shortness of breath (Dispense nebulizer kit with medication). 06/09/13  Yes Prashant K Singh, MD  aspirin EC 81 MG tablet Take 81 mg by mouth daily.   Yes Historical Provider, MD  Calcium Carb-Cholecalciferol (CALCIUM 600 + D PO) Take 1 tablet by mouth daily.   Yes Historical Provider, MD  Cholecalciferol (VITAMIN D-3) 1000 UNITS CAPS Take 2,000 Units by mouth daily.    Yes Historical Provider, MD  hydrochlorothiazide (HYDRODIURIL) 25 MG tablet Take 25 mg by mouth daily.   Yes Historical Provider, MD  losartan (COZAAR) 50 MG tablet Take 1 tablet (50 mg total) by mouth daily. 09/07/13  Yes Murali Ramaswamy, MD  SYMBICORT 160-4.5 MCG/ACT inhaler Inhale 2 puffs into the lungs 2 (two) times daily.  04/01/13  Yes Historical Provider, MD   Dg Hip Complete Right  10/08/2014   CLINICAL DATA:  Right hip injury after fall.  EXAM: RIGHT HIP - COMPLETE 2+ VIEW    COMPARISON:  CT scan of September 09, 2013  FINDINGS: Shortening of the right femoral neck is noted with cortical regularity noted superiorly and inferiorly suggesting mildly displaced proximal right femoral neck fracture. No dislocation is noted. Mild degenerative change of both hip joints is noted.  IMPRESSION: Findings consistent with mildly displaced proximal right femoral neck fracture.   Electronically Signed   By: James  Green M.D.   On: 10/08/2014 19:22    Positive ROS: All other systems have been reviewed and were otherwise negative with the exception of those mentioned in the HPI and as above.  Labs cbc  Recent Labs  10/08/14 1850  WBC 8.9  HGB 12.8  HCT 38.4  PLT 191    Labs  inflam No results for input(s): CRP in the last 72 hours.  Invalid input(s): ESR  Labs coag No results for input(s): INR, PTT in the last 72 hours.  Invalid input(s): PT   Recent Labs  10/08/14 1850  NA 138  K 3.6*  CL 101  CO2 24  GLUCOSE 94  BUN 30*  CREATININE 1.09  CALCIUM 9.0    Physical Exam: Filed Vitals:   10/08/14 2129  BP: 122/87  Pulse: 69  Temp:   Resp: 18   General: Alert, no acute distress Cardiovascular: No pedal edema Respiratory: No cyanosis, no use of accessory musculature GI: No organomegaly, abdomen is soft and non-tender Skin: No lesions in the area of chief complaint other than those listed below in MSK exam.  Neurologic: Sensation intact distally Psychiatric: Patient is competent for consent with normal mood and affect Lymphatic: No axillary or cervical lymphadenopathy  MUSCULOSKELETAL:  RLE: skin benign, SILT DP/SP/S/S/T nerve, 2+ DP, +TA/GS/EHL Compartments soft   Other extremities are atraumatic with painless ROM and NVI.  Assessment: R femoral neck fracture  Plan: Hip pinning in the afternoon tomorrow(monday) Weight Bearing Status: Bedrest for now, WBAT post op PT VTE px: SCD's and Chemical px post op, please hold any tomorrow   [name], D, MD Cell [phone]   10/08/2014 9:52 PM     

## 2014-10-09 NOTE — Progress Notes (Signed)
Utilization review completed.  

## 2014-10-09 NOTE — Progress Notes (Signed)
Patient going to surgery at this time.

## 2014-10-09 NOTE — Anesthesia Preprocedure Evaluation (Addendum)
Anesthesia Evaluation  Patient identified by MRN, date of birth, ID band Patient awake    Reviewed: Allergy & Precautions, H&P , NPO status , Patient's Chart, lab work & pertinent test results  Airway Mallampati: II  TM Distance: >3 FB Neck ROM: Full    Dental  (+) Dental Advisory Given, Teeth Intact   Pulmonary former smoker,  breath sounds clear to auscultation        Cardiovascular hypertension, Rhythm:Regular Rate:Normal     Neuro/Psych    GI/Hepatic   Endo/Other    Renal/GU      Musculoskeletal   Abdominal   Peds  Hematology   Anesthesia Other Findings   Reproductive/Obstetrics                            Anesthesia Physical Anesthesia Plan  ASA: III  Anesthesia Plan: MAC and Spinal   Post-op Pain Management:    Induction: Intravenous  Airway Management Planned: Natural Airway and Simple Face Mask  Additional Equipment:   Intra-op Plan:   Post-operative Plan:   Informed Consent: I have reviewed the patients History and Physical, chart, labs and discussed the procedure including the risks, benefits and alternatives for the proposed anesthesia with the patient or authorized representative who has indicated his/her understanding and acceptance.   Dental advisory given  Plan Discussed with: CRNA and Anesthesiologist  Anesthesia Plan Comments: (Htn Asthma -lungs clear GERD  Plan SAB  Roberts Gaudy )        Anesthesia Quick Evaluation

## 2014-10-09 NOTE — Op Note (Signed)
10/08/2014 - 10/09/2014  6:08 PM  PATIENT:  Kelly Delgado    PRE-OPERATIVE DIAGNOSIS:  Right hip fracture  POST-OPERATIVE DIAGNOSIS:  Same  PROCEDURE:  CANNULATED HIP PINNING  SURGEON:  Javeon Macmurray, D, MD  ASSISTANT: Joya Gaskins, OPA, He was necessary for efficiency and safety of the case.   ANESTHESIA:   General  PREOPERATIVE INDICATIONS:  [name] is a  78 y.o. female who fell and was found to have a diagnosis of Right hip fracture who elected for surgical management.    The risks benefits and alternatives were discussed with the patient preoperatively including but not limited to the risks of infection, bleeding, nerve injury, cardiopulmonary complications, blood clots, malunion, nonunion, avascular necrosis, the need for revision surgery, the potential for conversion to hemiarthroplasty, among others, and the patient was willing to proceed.  OPERATIVE IMPLANTS: 6.5 mm cannulated screws x3  OPERATIVE FINDINGS: Clinical osteoporosis with weak bone, proximal femur  OPERATIVE PROCEDURE: The patient was brought to the operating room and placed in supine position. IV antibiotics were given. General anesthesia administered. Foley was also given. The patient was placed on the fracture table. The operative extremity was positioned, without any significant reduction maneuver and was prepped and draped in usual sterile fashion.  Time out was performed.  Small incisions were made distal to the greater trochanter, and 3 guidewires were introduced Into an inverted triangle configuration. The lengths were measured. The reduction was slightly valgus, and near-anatomic. I opened the cortex with a cannulated drill, and then placed the screws into position. Satisfactory fixation was achieved. I sequentially tightened the screws by hand.  I performed a live fluoroscopic exam and no screw penetrance was noted. All threads crossed the fracture site.   The wounds were irrigated copiously, and  repaired with Vicryl with Steri-Strips and sterile gauze. There no complications and the patient tolerated the procedure well.  The patient will be weightbearing as tolerated, VTE prophylaxis will be: ASA 325   This note was generated using a template and dragon dictation system. In light of that, I have reviewed the note and all aspects of it are applicable to this case. Any dictation errors are due to the computerized dictation system.

## 2014-10-09 NOTE — Discharge Instructions (Signed)
Bear weight as tolerated ° °Keep incisions dry and covered until follow up °

## 2014-10-09 NOTE — Anesthesia Postprocedure Evaluation (Signed)
  Anesthesia Post-op Note  Patient: Kelly Delgado  Procedure(s) Performed: Procedure(s): CANNULATED HIP PINNING (Right)  Patient Location: PACU  Anesthesia Type:Spinal  Level of Consciousness: awake, alert  and oriented  Airway and Oxygen Therapy: Patient Spontanous Breathing and Patient connected to nasal cannula oxygen  Post-op Pain: mild  Post-op Assessment: Post-op Vital signs reviewed, Patient's Cardiovascular Status Stable, Respiratory Function Stable, RESPIRATORY FUNCTION UNSTABLE, No signs of Nausea or vomiting and Pain level controlled  Post-op Vital Signs: stable  Last Vitals:  Filed Vitals:   10/09/14 1830  BP:   Pulse: 69  Temp:   Resp: 16    Complications: No apparent anesthesia complications

## 2014-10-09 NOTE — Progress Notes (Signed)
[facility] PROGRESS NOTE   Kelly Delgado WUJ:[id number] DOB: 1935/12/13 DOA: 10/08/2014 PCP: Cammy Copa, MD  HPI/Subjective: Seen with husband at bedside, denies any pain. Denies any complaints.  Assessment/Plan: Principal Problem:   Hip fracture, right Active Problems:   Asthmatic bronchitis   Essential hypertension    Right closed hip fracture Sustained a right hip fracture after fall, she was complaining about mild pain. Pain is controlled with narcotics. Orthopedics consulted patient for surgery later today. We'll start Lovenox for DVT prophylaxis, after the procedure.  Essential hypertension Continue hydrochlorothiazide and losartan.  Dehydration Prerenal azotemia with increased BUN and creatinine is in the upper limit of normal. Hydrated with IV fluids, improving, check BMP in a.m.   Asthmatic bronchitis Stable continue home medications.  Code Status: Full code Family Communication: Plan discussed with the patient. Disposition Plan: Remains inpatient   Consultants:  Orthopedics  Procedures:  None  Antibiotics:  None   Objective: Filed Vitals:   10/09/14 0347  BP: 137/70  Pulse: 84  Temp: 98.6 F (37 C)  Resp: 18   No intake or output data in the 24 hours ending 10/09/14 1229 Filed Weights   10/08/14 2305  Weight: 81.194 kg (179 lb)    Exam: General: Alert and awake, oriented x3, not in any acute distress. HEENT: anicteric sclera, pupils reactive to light and accommodation, EOMI CVS: S1-S2 clear, no murmur rubs or gallops Chest: clear to auscultation bilaterally, no wheezing, rales or rhonchi Abdomen: soft nontender, nondistended, normal bowel sounds, no organomegaly Extremities: no cyanosis, clubbing or edema noted bilaterally Neuro: Cranial nerves II-XII intact, no focal neurological deficits  Data Reviewed: Basic Metabolic Panel:  Recent Labs Lab 10/08/14 1850 10/09/14 0605  NA 138 140  K 3.6* 4.1  CL 101  104  CO2 24 24  GLUCOSE 94 92  BUN 30* 24*  CREATININE 1.09 0.92  CALCIUM 9.0 8.8   Liver Function Tests:  Recent Labs Lab 10/08/14 1850  AST 39*  ALT 38*  ALKPHOS 240*  BILITOT 0.3  PROT 7.0  ALBUMIN 3.2*   No results for input(s): LIPASE, AMYLASE in the last 168 hours. No results for input(s): AMMONIA in the last 168 hours. CBC:  Recent Labs Lab 10/08/14 1850 10/09/14 0605  WBC 8.9 9.0  NEUTROABS 5.9  --   HGB 12.8 12.0  HCT 38.4 36.5  MCV 87.7 88.2  PLT 191 171   Cardiac Enzymes: No results for input(s): CKTOTAL, CKMB, CKMBINDEX, TROPONINI in the last 168 hours. BNP (last 3 results) No results for input(s): PROBNP in the last 8760 hours. CBG: No results for input(s): GLUCAP in the last 168 hours.  Micro Recent Results (from the past 240 hour(s))  MRSA PCR Screening     Status: None   Collection Time: 10/09/14  6:42 AM  Result Value Ref Range Status   MRSA by PCR NEGATIVE NEGATIVE Final    Comment:        The GeneXpert MRSA Assay (FDA approved for NASAL specimens only), is one component of a comprehensive MRSA colonization surveillance program. It is not intended to diagnose MRSA infection nor to guide or monitor treatment for MRSA infections.      Studies: Dg Hip Complete Right  10/08/2014   CLINICAL DATA:  Right hip injury after fall.  EXAM: RIGHT HIP - COMPLETE 2+ VIEW  COMPARISON:  CT scan of September 09, 2013  FINDINGS: Shortening of the right femoral neck is noted with cortical regularity noted superiorly and inferiorly suggesting  mildly displaced proximal right femoral neck fracture. No dislocation is noted. Mild degenerative change of both hip joints is noted.  IMPRESSION: Findings consistent with mildly displaced proximal right femoral neck fracture.   Electronically Signed   By: Sabino Dick M.D.   On: 10/08/2014 19:22    Scheduled Meds: . aspirin EC  81 mg Oral Daily  . budesonide-formoterol  2 puff Inhalation BID  .  hydrochlorothiazide  25 mg Oral Daily  . losartan  50 mg Oral Daily   Continuous Infusions: . 0.9 % NaCl with KCl 20 mEq / L         Time spent: 35 minutes    [facility]  [facility] Pager [phone] If 7PM-7AM, please contact night-coverage at [email or web address], password [facility] 10/09/2014, 12:29 PM  LOS: 1 day

## 2014-10-09 NOTE — Anesthesia Procedure Notes (Signed)
Spinal Patient location during procedure: OR Start time: 10/09/2014 4:50 PM End time: 10/09/2014 4:55 PM Staffing Anesthesiologist: Linna Caprice, Clifton Safley Performed by: anesthesiologist  Preanesthetic Checklist Completed: patient identified, site marked, surgical consent, pre-op evaluation, timeout performed, IV checked, risks and benefits discussed and monitors and equipment checked Spinal Block Patient position: right lateral decubitus Prep: ChloraPrep Patient monitoring: heart rate, cardiac monitor, continuous pulse ox and blood pressure Approach: right paramedian Location: L3-4 Injection technique: single-shot Needle Needle type: Tuohy  Needle gauge: 22 G Needle length: 9 cm Needle insertion depth: 6 cm Additional Notes 7.5 mg 0.75%  Marcaine injected easily

## 2014-10-10 ENCOUNTER — Encounter (HOSPITAL_COMMUNITY): Payer: Self-pay | Admitting: Orthopedic Surgery

## 2014-10-10 LAB — BASIC METABOLIC PANEL
ANION GAP: 12 (ref 5–15)
BUN: 17 mg/dL (ref 6–23)
CALCIUM: 8.5 mg/dL (ref 8.4–10.5)
CO2: 26 mEq/L (ref 19–32)
CREATININE: 0.86 mg/dL (ref 0.50–1.10)
Chloride: 101 mEq/L (ref 96–112)
GFR calc Af Amer: 74 mL/min — ABNORMAL LOW (ref >90)
GFR calc non Af Amer: 64 mL/min — ABNORMAL LOW (ref >90)
Glucose, Bld: 117 mg/dL — ABNORMAL HIGH (ref 70–99)
Potassium: 3.8 mEq/L (ref 3.7–5.3)
Sodium: 139 mEq/L (ref 137–147)

## 2014-10-10 LAB — URINE CULTURE: Colony Count: >=100000

## 2014-10-10 MED ORDER — SODIUM CHLORIDE 0.9 % IV BOLUS (SEPSIS)
500.0000 mL | Freq: Once | INTRAVENOUS | Status: AC
  Administered 2014-10-10: 500 mL via INTRAVENOUS

## 2014-10-10 MED ORDER — ENOXAPARIN SODIUM 40 MG/0.4ML ~~LOC~~ SOLN
40.0000 mg | SUBCUTANEOUS | Status: DC
  Administered 2014-10-10: 40 mg via SUBCUTANEOUS
  Filled 2014-10-10 (×2): qty 0.4

## 2014-10-10 NOTE — Evaluation (Signed)
Physical Therapy Evaluation Patient Details Name: Kelly Delgado MRN: [id number] DOB: 15-Jan-1936 Today's Date: 10/10/2014   History of Present Illness  Pt is a 78 y/o female s/p mechanical fall w/ resultant R hip fracture w/ pinning on 10/09/14.  Clinical Impression  Pt ambulation limited by "whooziness." BP taken s/p ambulation at 89/59. RN aware. Suspect once BP stabilizes pt to progress well enough to d/c home. However if pt can not tolerate ambulation or OOB mobility pt may need to go to SNF upon d/c until pt can achieve safe mod I level of function for safe d/c home.    Follow Up Recommendations Home health PT;Supervision/Assistance - 24 hour    Equipment Recommendations  Rolling walker with 5" wheels;3in1 (PT)    Recommendations for Other Services       Precautions / Restrictions Precautions Precautions: Fall Restrictions Weight Bearing Restrictions: Yes RLE Weight Bearing: Weight bearing as tolerated      Mobility  Bed Mobility Overal bed mobility:  (pt received sitting up in chair)                Transfers Overall transfer level: Needs assistance Equipment used: Rolling walker (2 wheeled) Transfers: Sit to/from Stand Sit to Stand: Min assist         General transfer comment: VC's for safety and sequencing w/ RW, pt c/o feeling "woozy" from pain medication and naseous.  Ambulation/Gait Ambulation/Gait assistance: Min assist (RN pushed chair due to low BP) Ambulation Distance (Feet): 20 Feet Assistive device: Rolling walker (2 wheeled) Gait Pattern/deviations: Step-to pattern;Decreased step length - right;Decreased stance time - right;Antalgic Gait velocity: slow      Stairs            Wheelchair Mobility    Modified Rankin (Stroke Patients Only)       Balance Overall balance assessment:  (requires use of RW for safe standing/amb)                                           Pertinent Vitals/Pain Pain Assessment:  0-10 Pain Score: 1  Pain Location: R hip Pain Descriptors / Indicators: Aching    Home Living Family/patient expects to be discharged to:: Private residence Living Arrangements: Spouse/significant other Available Help at Discharge: Family;Available 24 hours/day Type of Home: House Home Access: Ramped entrance     Home Layout: One level Home Equipment: None      Prior Function Level of Independence: Independent               Hand Dominance   Dominant Hand: Right    Extremity/Trunk Assessment   Upper Extremity Assessment: Defer to OT evaluation           Lower Extremity Assessment: Generalized weakness (R hip ROM limited by pain from surgery)      Cervical / Trunk Assessment: Normal  Communication   Communication: No difficulties  Cognition Arousal/Alertness: Awake/alert Behavior During Therapy: WFL for tasks assessed/performed Overall Cognitive Status: Within Functional Limits for tasks assessed                      General Comments General comments (skin integrity, edema, etc.): pt bruises easily    Exercises Total Joint Exercises Ankle Circles/Pumps: AROM;Both;10 reps Long Arc Quad: AROM;Right;10 reps;Seated Marching in Standing: AROM;Both;20 reps;Standing      Assessment/Plan    PT Assessment Patient needs continued  PT services  PT Diagnosis Difficulty walking;Abnormality of gait;Generalized weakness;Acute pain   PT Problem List Decreased strength;Decreased range of motion;Decreased activity tolerance;Decreased balance;Decreased mobility  PT Treatment Interventions DME instruction;Gait training;Functional mobility training;Therapeutic activities;Therapeutic exercise;Balance training   PT Goals (Current goals can be found in the Care Plan section) Acute Rehab PT Goals Patient Stated Goal: "get back to normal" PT Goal Formulation: With patient Time For Goal Achievement: 10/17/14 Potential to Achieve Goals: Good    Frequency 7X/week    Barriers to discharge        Co-evaluation               End of Session Equipment Utilized During Treatment: Gait belt Activity Tolerance: Patient tolerated treatment well Patient left: in chair;with call bell/phone within reach;with nursing/sitter in room Nurse Communication: Mobility status (lightheadedness/low bp)         Time: 9672-8979 PT Time Calculation (min) (ACUTE ONLY): 30 min   Charges:   PT Evaluation $Initial PT Evaluation Tier I: 1 Procedure PT Treatments $Gait Training: 8-22 mins   PT G CodesKingsley Callander 10/10/2014, 12:46 PM   Kittie Plater, PT, DPT Pager #: [phone] Office #: [phone]

## 2014-10-10 NOTE — Evaluation (Signed)
Occupational Therapy Evaluation Patient Details Name: Kelly Delgado MRN: [id number] DOB: [birth year] Today's Date: 10/10/2014    History of Present Illness Pt is a 78 y/o female s/p mechanical fall w/ resultant R hip fracture w/ pinning on 10/09/14.   Clinical Impression   Pt is a 78 y/o female admitted s/p right hip fracture and pinning. She will benefit from acute OT to address deficits in ADL's and functional mobility as well as provide pt/family education for A/E and DME prior to being d/c home. Rec HHOT at d/c, husband reports that he can assist PRN.    Follow Up Recommendations  Home health OT;Supervision - Intermittent    Equipment Recommendations  Tub/shower bench;3 in 1 bedside comode    Recommendations for Other Services       Precautions / Restrictions Precautions Precautions: Fall Restrictions Weight Bearing Restrictions: Yes RLE Weight Bearing: Weight bearing as tolerated      Mobility Bed Mobility Overal bed mobility:  (Pt up in chair)                Transfers Overall transfer level: Needs assistance Equipment used: Rolling walker (2 wheeled) Transfers: Sit to/from Stand Sit to Stand: Min assist         General transfer comment: VC's for safety and sequencing w/ RW, pt c/o feeling "woozy" from pain medication and naseous.    Balance Overall balance assessment: No apparent balance deficits (not formally assessed)                                          ADL Overall ADL's : Needs assistance/impaired Eating/Feeding: Set up;Modified independent;Sitting   Grooming: Wash/dry hands;Wash/dry face;Sitting;Set up   Upper Body Bathing: Set up;Sitting   Lower Body Bathing: Minimal assistance;Sit to/from stand Lower Body Bathing Details (indicate cue type and reason): Pt will benefit from A/E instruction, motivated and independent Upper Body Dressing : Set up;Modified independent;Sitting   Lower Body Dressing: Minimal  assistance;Sit to/from stand Lower Body Dressing Details (indicate cue type and reason): Pt will benefit from A/E instruction for LB ADL's Toilet Transfer: Minimal assistance;Ambulation;BSC;RW (Simulated today sit to stand and SPT from recliner to recliner due to c/o feeling "woozy & naseous")   Toileting- Clothing Manipulation and Hygiene: Sit to/from stand;Min guard   Tub/ Banker:  (TBA)   Functional mobility during ADLs: Minimal assistance;Rolling walker;Cueing for sequencing (Pt is currently Min A for transfers using RW, she had c/o nausea and feeling "woozy" from "the medicine that I took before this") General ADL Comments: Pt will benefit from acute OT to address deficits in ADL's and functional mobility as well as provide pt/family education for A/E and DME prior to being d/c home. Rec HHOT at d/c, husband reports that he can assist PRN. Pt was educated in role of OT and participated in ADL transfer today w/ in her tolerance.     Vision  Wears glasses for reading only. No change from baseline.                   Perception     Praxis      Pertinent Vitals/Pain Pain Assessment: No/denies pain     Hand Dominance Right   Extremity/Trunk Assessment Upper Extremity Assessment Upper Extremity Assessment: Overall WFL for tasks assessed   Lower Extremity Assessment Lower Extremity Assessment: Defer to PT evaluation       Communication  Communication Communication: No difficulties   Cognition Arousal/Alertness: Awake/alert Behavior During Therapy: WFL for tasks assessed/performed Overall Cognitive Status: Within Functional Limits for tasks assessed                     General Comments       Exercises       Shoulder Instructions      Home Living Family/patient expects to be discharged to:: Private residence Living Arrangements: Spouse/significant other Available Help at Discharge: Family;Available 24 hours/day Type of Home: House Home Access:  Ramped entrance     Home Layout: One level     Bathroom Shower/Tub: Tub/shower unit Shower/tub characteristics: Architectural technologist: Standard Bathroom Accessibility: Yes How Accessible: Accessible via walker Home Equipment: None          Prior Functioning/Environment Level of Independence: Independent             OT Diagnosis: Generalized weakness;Acute pain   OT Problem List: Decreased activity tolerance;Decreased knowledge of precautions;Decreased knowledge of use of DME or AE;Decreased strength;Pain   OT Treatment/Interventions: Self-care/ADL training;Therapeutic exercise;Energy conservation;DME and/or AE instruction;Patient/family education;Therapeutic activities    OT Goals(Current goals can be found in the care plan section) Acute Rehab OT Goals Patient Stated Goal: "Get well and back to normal" "I don't plan on needing help for long" Time For Goal Achievement: 10/17/14 Potential to Achieve Goals: Good ADL Goals Pt Will Perform Grooming: with supervision;standing Pt Will Perform Lower Body Bathing: sit to/from stand;with adaptive equipment;with supervision Pt Will Perform Lower Body Dressing: with adaptive equipment;sit to/from stand;with supervision Pt Will Transfer to Toilet: with supervision;ambulating;bedside commode Pt Will Perform Toileting - Clothing Manipulation and hygiene: with supervision;sit to/from stand Pt Will Perform Tub/Shower Transfer: Tub transfer;with min guard assist;ambulating;tub bench;rolling walker  OT Frequency: Min 2X/week   Barriers to D/C:            Co-evaluation              End of Session Equipment Utilized During Treatment: Gait belt;Rolling walker  Activity Tolerance: Patient tolerated treatment well;Other (comment) ("Woozy" and naseous) Patient left: in chair;with call bell/phone within reach   Time: 1023-1047 OT Time Calculation (min): 24 min Charges:  OT General Charges $OT Visit: 1 Procedure OT  Evaluation $Initial OT Evaluation Tier I: 1 Procedure OT Treatments $Self Care/Home Management : 8-22 mins G-Codes:    Almyra Deforest, OT 10/10/2014, 11:02 AM

## 2014-10-10 NOTE — Progress Notes (Signed)
[facility] PROGRESS NOTE   Piera Tapp ZTI:[id number] DOB: 1936/07/01 DOA: 10/08/2014 PCP: Cammy Copa, MD  HPI/Subjective: Seen while she was trying to get up with PT walk around. Pain is controlled. PT recommend home with home health, likely for discharge in a.m.  Assessment/Plan: Principal Problem:   Hip fracture, right Active Problems:   Asthmatic bronchitis   Essential hypertension    Right closed hip fracture Sustained a right hip fracture after fall, she was complaining about mild pain. Pain is controlled with narcotics. Orthopedics consulted and patient had open reduction internal fixation. Lovenox for DVT prophylaxis.  Essential hypertension Continue hydrochlorothiazide and losartan.  Dehydration Prerenal azotemia with increased BUN and creatinine is in the upper limit of normal. Hydrated with IV fluids, improving, this is resolved  Asthmatic bronchitis Stable continue home medications.  Code Status: Full code Family Communication: Plan discussed with the patient. Disposition Plan: Remains inpatient   Consultants:  Orthopedics  Procedures:  None  Antibiotics:  None   Objective: Filed Vitals:   10/10/14 1418  BP: 87/42  Pulse: 76  Temp: 98 F (36.7 C)  Resp: 18    Intake/Output Summary (Last 24 hours) at 10/10/14 1434 Last data filed at 10/10/14 1230  Gross per 24 hour  Intake   2110 ml  Output   1476 ml  Net    634 ml   Filed Weights   10/08/14 2305  Weight: 81.194 kg (179 lb)    Exam: General: Alert and awake, oriented x3, not in any acute distress. HEENT: anicteric sclera, pupils reactive to light and accommodation, EOMI CVS: S1-S2 clear, no murmur rubs or gallops Chest: clear to auscultation bilaterally, no wheezing, rales or rhonchi Abdomen: soft nontender, nondistended, normal bowel sounds, no organomegaly Extremities: no cyanosis, clubbing or edema noted bilaterally Neuro: Cranial nerves II-XII  intact, no focal neurological deficits  Data Reviewed: Basic Metabolic Panel:  Recent Labs Lab 10/08/14 1850 10/09/14 0605 10/10/14 0449  NA 138 140 139  K 3.6* 4.1 3.8  CL 101 104 101  CO2 24 24 26   GLUCOSE 94 92 117*  BUN 30* 24* 17  CREATININE 1.09 0.92 0.86  CALCIUM 9.0 8.8 8.5   Liver Function Tests:  Recent Labs Lab 10/08/14 1850  AST 39*  ALT 38*  ALKPHOS 240*  BILITOT 0.3  PROT 7.0  ALBUMIN 3.2*   No results for input(s): LIPASE, AMYLASE in the last 168 hours. No results for input(s): AMMONIA in the last 168 hours. CBC:  Recent Labs Lab 10/08/14 1850 10/09/14 0605  WBC 8.9 9.0  NEUTROABS 5.9  --   HGB 12.8 12.0  HCT 38.4 36.5  MCV 87.7 88.2  PLT 191 171   Cardiac Enzymes: No results for input(s): CKTOTAL, CKMB, CKMBINDEX, TROPONINI in the last 168 hours. BNP (last 3 results) No results for input(s): PROBNP in the last 8760 hours. CBG: No results for input(s): GLUCAP in the last 168 hours.  Micro Recent Results (from the past 240 hour(s))  Urine culture     Status: None   Collection Time: 10/08/14  9:29 PM  Result Value Ref Range Status   Specimen Description URINE, CLEAN CATCH  Final   Special Requests NONE  Final   Culture  Setup Time   Final    10/08/2014 20:51 Performed at [facility]   Final    >=100,000 COLONIES/ML Performed at [facility]    Culture   Final    Multiple bacterial  morphotypes present, none predominant. Suggest appropriate recollection if clinically indicated. Performed at [facility]    Report Status 10/10/2014 FINAL  Final  MRSA PCR Screening     Status: None   Collection Time: 10/09/14  6:42 AM  Result Value Ref Range Status   MRSA by PCR NEGATIVE NEGATIVE Final    Comment:        The GeneXpert MRSA Assay (FDA approved for NASAL specimens only), is one component of a comprehensive MRSA colonization surveillance program. It is not intended to diagnose  MRSA infection nor to guide or monitor treatment for MRSA infections.      Studies: Dg Hip Complete Right  10/08/2014   CLINICAL DATA:  Right hip injury after fall.  EXAM: RIGHT HIP - COMPLETE 2+ VIEW  COMPARISON:  CT scan of September 09, 2013  FINDINGS: Shortening of the right femoral neck is noted with cortical regularity noted superiorly and inferiorly suggesting mildly displaced proximal right femoral neck fracture. No dislocation is noted. Mild degenerative change of both hip joints is noted.  IMPRESSION: Findings consistent with mildly displaced proximal right femoral neck fracture.   Electronically Signed   By: Sabino Dick M.D.   On: 10/08/2014 19:22   Dg Hip Operative Right  10/09/2014   CLINICAL DATA:  Fracture fixation right hip after a fall 10/08/2014.  EXAM: DG OPERATIVE RIGHT HIP 1-2 VIEWS  TECHNIQUE: Fluoroscopic spot image(s) were submitted for interpretation post-operatively.  COMPARISON:  Plain films of the right hip 10/08/2014.  FINDINGS: Three fixation screws are now in place and locate a subcapital fracture right hip. Hardware appears well positioned. Position and alignment of the patient's fracture is anatomic. No acute abnormality is identified.  IMPRESSION: ORIF right hip fracture without complication.   Electronically Signed   By: Inge Rise M.D.   On: 10/09/2014 18:26   Pelvis Portable  10/09/2014   CLINICAL DATA:  Status post fracture fixation right hip.  EXAM: PORTABLE PELVIS 1-2 VIEWS  COMPARISON:  Plain films right hip 10/08/2014.  FINDINGS: Three pins are seen fixing a subcapital right hip fracture. Hardware is well positioned. Position and alignment of the patient's fracture are anatomic. No acute abnormality is identified. Gas in the soft tissues about the right hip from surgery is noted.  IMPRESSION: Status post ORIF right hip fracture without complicating feature.   Electronically Signed   By: Inge Rise M.D.   On: 10/09/2014 19:36    Scheduled  Meds: . aspirin EC  325 mg Oral Q breakfast  . budesonide-formoterol  2 puff Inhalation BID  . hydrochlorothiazide  25 mg Oral Daily  . losartan  50 mg Oral Daily   Continuous Infusions: . dextrose 5 % and 0.45% NaCl 75 mL/hr at 10/10/14 0933       Time spent: 35 minutes    [facility]  [facility] Pager [phone] If 7PM-7AM, please contact night-coverage at [email or web address], password [facility] 10/10/2014, 2:34 PM  LOS: 2 days

## 2014-10-10 NOTE — Progress Notes (Signed)
Kelly Delgado was feeling dizzy when she ambulated with PT at 1230.   The patient stated she was more sensitive to the narcotics; and, has been more sleepy than usual after taking the norco this morning.  Patient's bp was 89/59 after working with PT.  At 1400, the patient's bp was 87/42.  Dr. Verlee Monte notified, orders received.  After giving the bolus, the patient's bp was 93/45, and patient has been more alert.  Nursing to continue to monitor.

## 2014-10-11 DIAGNOSIS — S72001S Fracture of unspecified part of neck of right femur, sequela: Secondary | ICD-10-CM

## 2014-10-11 LAB — CBC
HCT: 33.7 % — ABNORMAL LOW (ref 36.0–46.0)
Hemoglobin: 11.2 g/dL — ABNORMAL LOW (ref 12.0–15.0)
MCH: 29.8 pg (ref 26.0–34.0)
MCHC: 33.2 g/dL (ref 30.0–36.0)
MCV: 89.6 fL (ref 78.0–100.0)
PLATELETS: 149 10*3/uL — AB (ref 150–400)
RBC: 3.76 MIL/uL — ABNORMAL LOW (ref 3.87–5.11)
RDW: 13.7 % (ref 11.5–15.5)
WBC: 6.5 10*3/uL (ref 4.0–10.5)

## 2014-10-11 LAB — BASIC METABOLIC PANEL
Anion gap: 10 (ref 5–15)
BUN: 23 mg/dL (ref 6–23)
CALCIUM: 8.4 mg/dL (ref 8.4–10.5)
CO2: 26 mEq/L (ref 19–32)
Chloride: 100 mEq/L (ref 96–112)
Creatinine, Ser: 0.85 mg/dL (ref 0.50–1.10)
GFR calc Af Amer: 75 mL/min — ABNORMAL LOW (ref >90)
GFR, EST NON AFRICAN AMERICAN: 65 mL/min — AB (ref >90)
GLUCOSE: 99 mg/dL (ref 70–99)
Potassium: 3.7 mEq/L (ref 3.7–5.3)
Sodium: 136 mEq/L — ABNORMAL LOW (ref 137–147)

## 2014-10-11 MED ORDER — SENNOSIDES-DOCUSATE SODIUM 8.6-50 MG PO TABS: 1.0000 | ORAL_TABLET | Freq: Every evening | ORAL | Status: DC | PRN

## 2014-10-11 NOTE — Progress Notes (Signed)
Patient ID: Kelly Delgado, female   DOB: February 14, 1936, 79 y.o.   MRN: [id number]     Subjective:  Patient reports pain as mild to moderate.  Patient feeling fatigued and does report being light headed at times  Objective:   VITALS:   Filed Vitals:   10/10/14 1821 10/10/14 2013 10/10/14 2200 10/11/14 0554  BP: 90/47  103/55 138/61  Pulse: 85  76 79  Temp:   98.2 F (36.8 C) 98.4 F (36.9 C)  TempSrc:      Resp:   18 18  Height:      Weight:      SpO2:  99% 97% 93%    ABD soft Sensation intact distally Dorsiflexion/Plantar flexion intact Incision: dressing C/D/I and no drainage   Lab Results  Component Value Date   WBC 6.5 10/11/2014   HGB 11.2* 10/11/2014   HCT 33.7* 10/11/2014   MCV 89.6 10/11/2014   PLT 149* 10/11/2014     Assessment/Plan: 2 Days Post-Op   Principal Problem:   Hip fracture, right Active Problems:   Asthmatic bronchitis   Essential hypertension   Advance diet Up with therapy  wbat Dry dressing PRN Continue plan per medicine Follow up with Dr Edmonia Lynch in two weeks Ortho signing off   Remonia Richter 10/11/2014, 8:12 AM   Edmonia Lynch MD [phone]

## 2014-10-11 NOTE — Progress Notes (Signed)
Physical Therapy Treatment Patient Details Name: Kelly Delgado MRN: [id number] DOB: 11-20-1935 Today's Date: 10/11/2014    History of Present Illness Pt is a 78 y/o female s/p mechanical fall w/ resultant R hip fracture w/ pinning on 10/09/14.    PT Comments    Pt with improved ambulation tolerance this date and functioning at supervision level. ANticpate pt to be safe for d/c home with spouse once medically stable.  Follow Up Recommendations  Home health PT;Supervision/Assistance - 24 hour     Equipment Recommendations  Rolling walker with 5" wheels (spouse found BSC at home)    Recommendations for Other Services       Precautions / Restrictions Precautions Precautions: Fall Restrictions Weight Bearing Restrictions: Yes RLE Weight Bearing: Weight bearing as tolerated    Mobility  Bed Mobility Overal bed mobility:  (pt up in chair upon PT arrival)                Transfers Overall transfer level: Needs assistance Equipment used: Rolling walker (2 wheeled) Transfers: Sit to/from Stand Sit to Stand: Min guard         General transfer comment: v/c's for hand placement  Ambulation/Gait Ambulation/Gait assistance: Min guard Ambulation Distance (Feet): 75 Feet (x2) Assistive device: Rolling walker (2 wheeled) Gait Pattern/deviations: Step-to pattern;Decreased stride length Gait velocity: slow   General Gait Details: pt with seated rest break   Stairs            Wheelchair Mobility    Modified Rankin (Stroke Patients Only)       Balance                                    Cognition Arousal/Alertness: Awake/alert Behavior During Therapy: WFL for tasks assessed/performed Overall Cognitive Status: Within Functional Limits for tasks assessed                      Exercises Total Joint Exercises Ankle Circles/Pumps: AROM;Both;10 reps Long Arc Quad: AROM;Right;10 reps;Seated Marching in Standing: AROM;Both;20  reps;Standing    General Comments General comments (skin integrity, edema, etc.): pt assisted to bathroom. pt instructed on self care in standing s/p tolieting. pt able to complete with supervision.      Pertinent Vitals/Pain Pain Assessment: No/denies pain    Home Living                      Prior Function            PT Goals (current goals can now be found in the care plan section) Acute Rehab PT Goals Patient Stated Goal: "get back home" Progress towards PT goals: Progressing toward goals    Frequency  7X/week    PT Plan Current plan remains appropriate    Co-evaluation             End of Session Equipment Utilized During Treatment: Gait belt Activity Tolerance: Patient tolerated treatment well Patient left: in chair;with call bell/phone within reach;with nursing/sitter in room     Time: 0820-0846 PT Time Calculation (min) (ACUTE ONLY): 26 min  Charges:  $Gait Training: 8-22 mins $Therapeutic Exercise: 8-22 mins                    G Codes:      Kingsley Callander 10/11/2014, 11:36 AM   Kittie Plater, PT, DPT Pager #: [phone] Office #: [phone]

## 2014-10-11 NOTE — Care Management Note (Signed)
CARE MANAGEMENT NOTE 10/11/2014  Patient:  Kelly Delgado, Kelly Delgado   Account Number:  [id number]  Date Initiated:  10/11/2014  Documentation initiated by:  Ricki Miller  Subjective/Objective Assessment:   78 yr old female admitted s/p fall with a right hip fracture. Patient had a right hip pinning.     Action/Plan:   Case manager spoke with patient concerning home health and DME needs. Choice offered. Referral called to [facility]. Patient's husband has located 3in1. will purchase tub bench.   Anticipated DC Date:  10/11/2014   Anticipated DC Plan:  [place] [facility]  CM consult      [place]   Choice offered to / List presented to:  C-1 Patient   DME arranged  [facility]ass[facility] Moselle      DME agency  [place] arranged  [place].   Status of service:  Completed, signed off Medicare Important Message given?   (If response is "NO", the following Medicare IM given date fields will be blank) Date Medicare IM given:   Medicare IM given by:   Date Additional Medicare IM given:  10/11/2014 Additional Medicare IM given by:  Ricki Miller  Discharge Disposition:  [place]  Per UR Regulation:  Reviewed for med. necessity/level of care/duration of stay  If discussed at [place] of Stay Meetings, dates discussed:    Comments:

## 2014-10-11 NOTE — Progress Notes (Signed)
Occupational Therapy Treatment Patient Details Name: Kelly Delgado MRN: [id number] DOB: [birth year] Today's Date: 10/11/2014    History of present illness Pt is a 78 y/o female s/p mechanical fall w/ resultant R hip fracture w/ pinning on 10/09/14.   OT comments  Pt educated in [place], tub equipment and safety.  Will rely on assist of husband until she is able to perform independently. Husband will supervise tub transfers.    Follow Up Recommendations  Home health OT;Supervision - Intermittent    Equipment Recommendations  None recommended by OT (has a 3 in 1, husband will purchase tub equipment on his own)    Recommendations for Other Services      Precautions / Restrictions Precautions Precautions: Fall Restrictions RLE Weight Bearing: Weight bearing as tolerated       Mobility Bed Mobility               General bed mobility comments: pt up in chair  Transfers Overall transfer level: Needs assistance Equipment used: Rolling walker (2 wheeled) Transfers: Sit to/from Stand Sit to Stand: Min guard         General transfer comment: v/c's for hand placement    Balance                                   ADL Overall ADL's : Needs assistance/impaired     Grooming: Wash/dry hands;Standing;Supervision/safety       Lower Body Bathing: Minimal assistance;Sit to/from stand Lower Body Bathing Details (indicate cue type and reason): instructed in use of long sponge and reacher     Lower Body Dressing: Minimal assistance;Sit to/from stand Lower Body Dressing Details (indicate cue type and reason): Instructed in use of sock aide, reacher, long shoe horn. Toilet Transfer: RW;Ambulation;BSC;Min guard   Toileting- Water quality scientist and Hygiene: Supervision/safety;Sit to/from Nurse, children's Details (indicate cue type and reason): Pt is familiar with tub transfer bench from caring for a family member.  Instructed on option of tub seat vs  bench.  Husband will determine best option for their bathroom set up at home and purchase on his own. Functional mobility during ADLs: Rolling walker;Min guard General ADL Comments: Instructed in home safety: removal of rugs, lighting at night when up to bathroom, proper footwear.  Instructed in transporting items with RW.  Husband will assist with LE dressing until pt is able to perform on her own.  Husband will supervise tub transfer.      Vision                     Perception     Praxis      Cognition   Behavior During Therapy: WFL for tasks assessed/performed Overall Cognitive Status: Within Functional Limits for tasks assessed                       Extremity/Trunk Assessment               Exercises     Shoulder Instructions       General Comments      Pertinent Vitals/ Pain       Pain Assessment: Faces Faces Pain Scale: Hurts little more Pain Location: R LE with movement Pain Descriptors / Indicators: Sore Pain Intervention(s): Monitored during session;Premedicated before session;Repositioned  Home Living  Prior Functioning/Environment              Frequency Min 2X/week     Progress Toward Goals  OT Goals(current goals can now be found in the care plan section)  Progress towards OT goals: Progressing toward goals  Acute Rehab OT Goals Patient Stated Goal: "get back home" Time For Goal Achievement: 10/17/14  Plan Discharge plan remains appropriate    Co-evaluation                 End of Session     Activity Tolerance Patient tolerated treatment well   Patient Left in chair;with call bell/phone within reach;with family/visitor present   Nurse Communication          Time: 4103-0131 OT Time Calculation (min): 26 min  Charges: OT General Charges $OT Visit: 1 Procedure OT Treatments $Self Care/Home Management : 23-37 mins  Malka So 10/11/2014, 2:45 PM

## 2014-10-11 NOTE — Discharge Summary (Signed)
Physician Discharge Summary  Kelly Delgado YIR:[id number] DOB: 06/10/1936 DOA: 10/08/2014  PCP: Cammy Copa, MD  Admit date: 10/08/2014 Discharge date: 10/11/2014  Time spent: 55 minutes  Recommendations for Outpatient Follow-up:  1. Home health PT/ OT ordered   Discharge Condition: stable Diet recommendation: heart healthy   Discharge Diagnoses:  Principal Problem:   Hip fracture, right Active Problems:   Asthmatic bronchitis   Essential hypertension   History of present illness:  Kelly Delgado is a 78 y.o. female with hypertension, asthma and gastroesophageal reflux disease who presents after a mechanical fall with a right femoral neck fracture. She is currently complaining of mild pain but was having a significant amount of pain after the fall and was unable to walk. She has no other complaints. Specifically has no complaints of chest pain, chest tightness or shortness of breath at rest or with exertion.  Hospital Course:  Right closed hip fracture Pain controlled with narcotics. Orthopedics consulted and patient had a cannulated pinning of the hip on 12/14 ASA 325 mg daiy ordered by ortho for DVT prophylaxis- recommended to f/u with Dr Percell Miller in 2 wks Will go home with HHPT/OT Dressing changes PRN with dry gauze per ortho  Essential hypertension Continue hydrochlorothiazide and losartan.  Dehydration Prerenal azotemia with increased BUN and creatinine is in the upper limit of normal. Hydrated with IV fluids with improvement noted   Asthmatic bronchitis Stable continue home medications.   Procedures:  12/14 CANNULATED HIP PINNING  Consultations:  Ortho   Discharge Exam: Filed Weights   10/08/14 2305  Weight: 81.194 kg (179 lb)   Filed Vitals:   10/11/14 0554  BP: 138/61  Pulse: 79  Temp: 98.4 F (36.9 C)  Resp: 18    General: AAO x 3, no distress Cardiovascular: RRR, no murmurs  Respiratory: clear to auscultation bilaterally GI: soft,  non-tender, non-distended, bowel sound positive Extremities: right hip - dressing in place- mild edema noted in leg and thigh.   Discharge Instructions You were cared for by a hospitalist during your hospital stay. If you have any questions about your discharge medications or the care you received while you were in the hospital after you are discharged, you can call the unit and asked to speak with the hospitalist on call if the hospitalist that took care of you is not available. Once you are discharged, your primary care physician will handle any further medical issues. Please note that NO REFILLS for any discharge medications will be authorized once you are discharged, as it is imperative that you return to your primary care physician (or establish a relationship with a primary care physician if you do not have one) for your aftercare needs so that they can reassess your need for medications and monitor your lab values.  Discharge Instructions    Diet - low sodium heart healthy    Complete by:  As directed      Face-to-face encounter (required for Medicare/Medicaid patients)    Complete by:  As directed   The encounter with the patient was in whole, or in part, for the following medical condition, which is the primary reason for home health care:  hip fracture  I certify that, based on my findings, the following services are medically necessary home health services:  Physical therapy  Reason for Medically Necessary Home Health Services:  Therapy- Therapeutic Exercises to Increase Strength and Endurance  My clinical findings support the need for the above services:  Unable to leave home safely without  assistance and/or assistive device  Further, I certify that my clinical findings support that this patient is homebound due to:  Unable to leave home safely without assistance     Home Health    Complete by:  As directed   To provide the following care/treatments:   PTOT       Increase activity  slowly    Complete by:  As directed      Weight bearing as tolerated    Complete by:  As directed             Medication List    TAKE these medications        Aclidinium Bromide 400 MCG/ACT Aepb  Commonly known as:  TUDORZA PRESSAIR  Inhale 1 puff into the lungs 2 (two) times daily.     albuterol (2.5 MG/3ML) 0.083% nebulizer solution  Commonly known as:  PROVENTIL  Take 3 mL (2.5 mg total) by nebulization every 6 (six) hours as needed for wheezing or shortness of breath (Dispense nebulizer kit with medication).     PROAIR HFA 108 (90 BASE) MCG/ACT inhaler  Generic drug:  albuterol  Inhale 2 puffs into the lungs every 6 (six) hours as needed for wheezing.     aspirin EC 325 MG tablet  Take 1 tablet (325 mg total) by mouth daily.     CALCIUM 600 + D PO  Take 1 tablet by mouth daily.     docusate sodium 100 MG capsule  Commonly known as:  COLACE  Take 1 capsule (100 mg total) by mouth 2 (two) times daily. Continue this while taking narcotics to help with bowel movements     hydrochlorothiazide 25 MG tablet  Commonly known as:  HYDRODIURIL  Take 25 mg by mouth daily.     HYDROcodone-acetaminophen 5-325 MG per tablet  Commonly known as:  NORCO  Take 1-2 tablets by mouth every 4 (four) hours as needed for moderate pain.     losartan 50 MG tablet  Commonly known as:  COZAAR  Take 1 tablet (50 mg total) by mouth daily.     senna-docusate 8.6-50 MG per tablet  Commonly known as:  Senokot-S  Take 1 tablet by mouth at bedtime as needed for mild constipation.     SYMBICORT 160-4.5 MCG/ACT inhaler  Generic drug:  budesonide-formoterol  Inhale 2 puffs into the lungs 2 (two) times daily.     Vitamin D-3 1000 UNITS Caps  Take 2,000 Units by mouth daily.       Allergies  Allergen Reactions  . Statins Other (See Comments)    Increased LFT's  . Spiriva [Tiotropium Bromide Monohydrate] Itching       Follow-up Information    Follow up with MURPHY, TIMOTHY, D, MD In 1  week.   Specialty:  Orthopedic Surgery   Contact information:   [address] [place] [address] [phone]        The results of significant diagnostics from this hospitalization (including imaging, microbiology, ancillary and laboratory) are listed below for reference.    Significant Diagnostic Studies: Dg Hip Complete Right  10/08/2014   CLINICAL DATA:  Right hip injury after fall.  EXAM: RIGHT HIP - COMPLETE 2+ VIEW  COMPARISON:  CT scan of September 09, 2013  FINDINGS: Shortening of the right femoral neck is noted with cortical regularity noted superiorly and inferiorly suggesting mildly displaced proximal right femoral neck fracture. No dislocation is noted. Mild degenerative change of both hip joints is  noted.  IMPRESSION: Findings consistent with mildly displaced proximal right femoral neck fracture.   Electronically Signed   By: Sabino Dick M.D.   On: 10/08/2014 19:22   Dg Hip Operative Right  10/09/2014   CLINICAL DATA:  Fracture fixation right hip after a fall 10/08/2014.  EXAM: DG OPERATIVE RIGHT HIP 1-2 VIEWS  TECHNIQUE: Fluoroscopic spot image(s) were submitted for interpretation post-operatively.  COMPARISON:  Plain films of the right hip 10/08/2014.  FINDINGS: Three fixation screws are now in place and locate a subcapital fracture right hip. Hardware appears well positioned. Position and alignment of the patient's fracture is anatomic. No acute abnormality is identified.  IMPRESSION: ORIF right hip fracture without complication.   Electronically Signed   By: Inge Rise M.D.   On: 10/09/2014 18:26   Pelvis Portable  10/09/2014   CLINICAL DATA:  Status post fracture fixation right hip.  EXAM: PORTABLE PELVIS 1-2 VIEWS  COMPARISON:  Plain films right hip 10/08/2014.  FINDINGS: Three pins are seen fixing a subcapital right hip fracture. Hardware is well positioned. Position and alignment of the patient's fracture are anatomic. No acute abnormality is  identified. Gas in the soft tissues about the right hip from surgery is noted.  IMPRESSION: Status post ORIF right hip fracture without complicating feature.   Electronically Signed   By: Inge Rise M.D.   On: 10/09/2014 19:36    Microbiology: Recent Results (from the past 240 hour(s))  Urine culture     Status: None   Collection Time: 10/08/14  9:29 PM  Result Value Ref Range Status   Specimen Description URINE, CLEAN CATCH  Final   Special Requests NONE  Final   Culture  Setup Time   Final    10/08/2014 20:51 Performed at [facility]   Final    >=100,000 COLONIES/ML Performed at [facility]    Culture   Final    Multiple bacterial morphotypes present, none predominant. Suggest appropriate recollection if clinically indicated. Performed at [facility]    Report Status 10/10/2014 FINAL  Final  MRSA PCR Screening     Status: None   Collection Time: 10/09/14  6:42 AM  Result Value Ref Range Status   MRSA by PCR NEGATIVE NEGATIVE Final    Comment:        The GeneXpert MRSA Assay (FDA approved for NASAL specimens only), is one component of a comprehensive MRSA colonization surveillance program. It is not intended to diagnose MRSA infection nor to guide or monitor treatment for MRSA infections.      Labs: Basic Metabolic Panel:  Recent Labs Lab 10/08/14 1850 10/09/14 0605 10/10/14 0449 10/11/14 0640  NA 138 140 139 136*  K 3.6* 4.1 3.8 3.7  CL 101 104 101 100  CO2 _0 GLUCOSE 94 92 117* 99  BUN 30* 24* 17 23  CREATININE 1.09 0.92 0.86 0.85  CALCIUM 9.0 8.8 8.5 8.4   Liver Function Tests:  Recent Labs Lab 10/08/14 1850  AST 39*  ALT 38*  ALKPHOS 240*  BILITOT 0.3  PROT 7.0  ALBUMIN 3.2*   No results for input(s): LIPASE, AMYLASE in the last 168 hours. No results for input(s): AMMONIA in the last 168 hours. CBC:  Recent Labs Lab 10/08/14 1850 10/09/14 0605 10/11/14 0640  WBC 8.9 9.0 6.5   NEUTROABS 5.9  --   --   HGB 12.8 12.0 11.2*  HCT 38.4 36.5 33.7*  MCV  87.7 88.2 89.6  PLT 191 171 149*   Cardiac Enzymes: No results for input(s): CKTOTAL, CKMB, CKMBINDEX, TROPONINI in the last 168 hours. BNP: BNP (last 3 results) No results for input(s): PROBNP in the last 8760 hours. CBG: No results for input(s): GLUCAP in the last 168 hours.     SignedDebbe Odea, MD [facility] 10/11/2014, 11:23 AM

## 2014-11-11 IMAGING — CR DG CHEST 1V PORT
1 series · 1 of 1 positions shown · non-contrast
Comparison: 06/06/2013.

CLINICAL DATA: Shortness of breath.

PORTABLE CHEST - 1 VIEW

[AP]
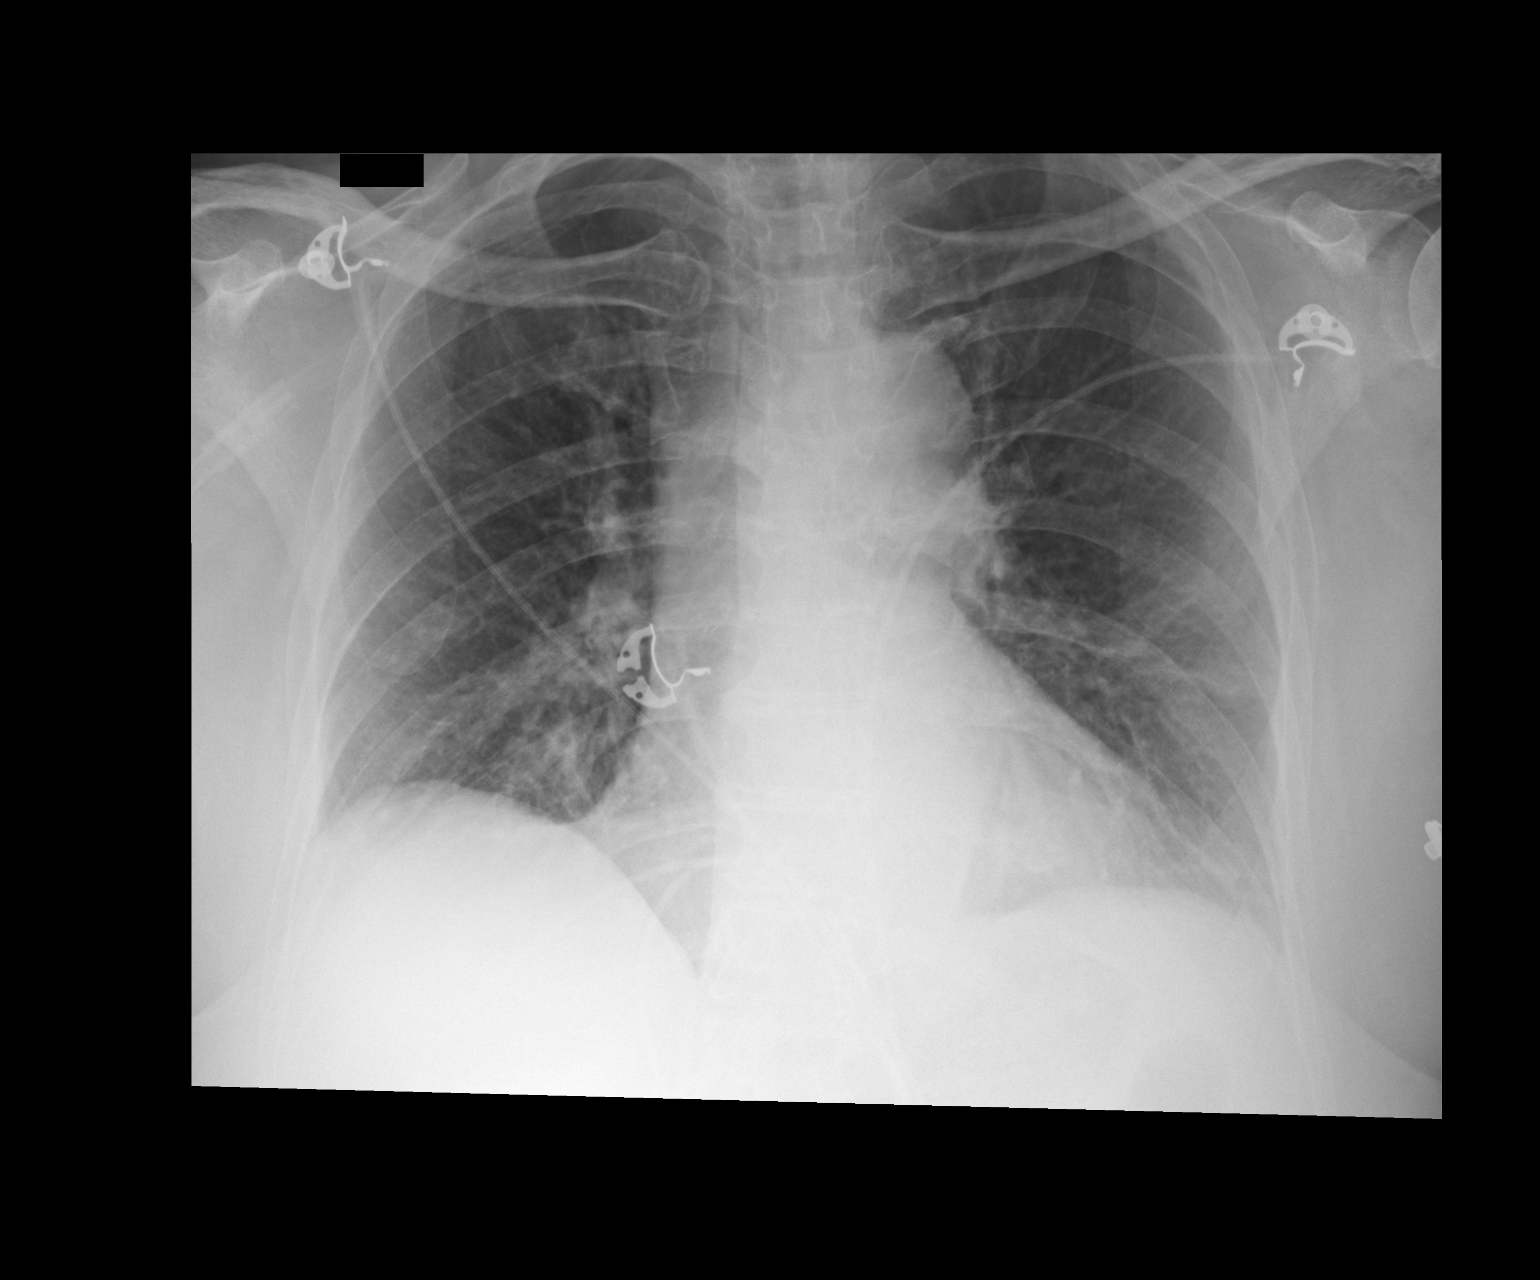

[1 of 1 positions shown; findings below may reference images not displayed]

FINDINGS: Trachea is midline.  Heart size is accentuated by AP
technique.  Lungs are somewhat low in volume with streaky
atelectasis at both lung bases.  No pleural fluid.  Old upper right
rib fracture.
IMPRESSION: Minimal streaky atelectasis at the lung bases.

## 2015-01-17 ENCOUNTER — Other Ambulatory Visit: Payer: Self-pay | Admitting: Family Medicine

## 2015-01-17 DIAGNOSIS — Z1231 Encounter for screening mammogram for malignant neoplasm of breast: Secondary | ICD-10-CM

## 2015-01-17 DIAGNOSIS — M8580 Other specified disorders of bone density and structure, unspecified site: Secondary | ICD-10-CM

## 2015-01-17 DIAGNOSIS — Z803 Family history of malignant neoplasm of breast: Secondary | ICD-10-CM

## 2015-01-31 ENCOUNTER — Ambulatory Visit
Admission: RE | Admit: 2015-01-31 | Discharge: 2015-01-31 | Disposition: A | Discharge status: Home / Self Care | Payer: Medicare Other | Source: Ambulatory Visit | Attending: Family Medicine | Admitting: Family Medicine

## 2015-01-31 DIAGNOSIS — Z803 Family history of malignant neoplasm of breast: Secondary | ICD-10-CM

## 2015-01-31 DIAGNOSIS — M8580 Other specified disorders of bone density and structure, unspecified site: Secondary | ICD-10-CM

## 2015-01-31 DIAGNOSIS — Z1231 Encounter for screening mammogram for malignant neoplasm of breast: Secondary | ICD-10-CM

## 2015-02-28 ENCOUNTER — Other Ambulatory Visit: Payer: Self-pay | Admitting: Gastroenterology

## 2015-02-28 DIAGNOSIS — R1013 Epigastric pain: Secondary | ICD-10-CM

## 2015-03-07 ENCOUNTER — Ambulatory Visit
Admission: RE | Admit: 2015-03-07 | Discharge: 2015-03-07 | Disposition: A | Discharge status: Home / Self Care | Payer: Medicare Other | Source: Ambulatory Visit | Attending: Gastroenterology | Admitting: Gastroenterology

## 2015-03-07 DIAGNOSIS — R1013 Epigastric pain: Secondary | ICD-10-CM

## 2015-05-23 ENCOUNTER — Encounter: Payer: Self-pay | Admitting: Internal Medicine

## 2015-05-23 ENCOUNTER — Ambulatory Visit (INDEPENDENT_AMBULATORY_CARE_PROVIDER_SITE_OTHER): Payer: Medicare Other | Admitting: Internal Medicine

## 2015-05-23 ENCOUNTER — Encounter (INDEPENDENT_AMBULATORY_CARE_PROVIDER_SITE_OTHER): Payer: Self-pay

## 2015-05-23 VITALS — BP 122/82 | HR 79 | Ht 67.0 in | Wt 184.8 lb

## 2015-05-23 DIAGNOSIS — J449 Chronic obstructive pulmonary disease, unspecified: Secondary | ICD-10-CM | POA: Diagnosis not present

## 2015-05-23 DIAGNOSIS — R911 Solitary pulmonary nodule: Secondary | ICD-10-CM

## 2015-05-23 MED ORDER — BUDESONIDE-FORMOTEROL FUMARATE 80-4.5 MCG/ACT IN AERO: 2.0000 | INHALATION_SPRAY | Freq: Two times a day (BID) | RESPIRATORY_TRACT | Status: DC

## 2015-05-23 NOTE — Progress Notes (Signed)
Subjective:    Patient ID: Kelly Delgado, female    DOB: June 18, 1936, 79 y.o.   MRN: [id number]  HPI   OV 05/23/2015  Chief Complaint  Patient presents with  . Follow-up    Breathing doing well but will get SOB if in the heat too long. Occ dry cough. Denies any wheezing. No longer using o2 at bedtime. CAT Score 9.    Lung nodules:  reports that she quit smoking about 46 years ago. Her smoking use included Cigarettes. She has a 3.3 pack-year smoking history. She has never used smokeless tobacco. but she has lung nodules on CT scan of the chest September 2015. She will need an annual follow-up CT scan of the chest September 2016   COPD: She has COPD with bronchial dilator response. This is based on emphysema seen on CT scan of the chest and Gold stage II obstructive lung function.   She was on triple inhaler therapy but earlier in the year in January 2016 due to cost issues and after breaking her hip due to a fall she stopped her long-acting anticholinergic. This did not impact his symptoms. In fact for the whole of 2016 she's been minimally symptomatic. She is now exercising at the [facility]. She is come off her walker after her hip fracture. COPD cat score is 9 and detailed below. This shows only minimal symptoms.      CAT COPD Symptom & Quality of Life Score (GSK trademark) 0 is no burden. 5 is highest burden 05/23/2015   Never Cough -> Cough all the time 2  No phlegm in chest -> Chest is full of phlegm 1  No chest tightness -> Chest feels very tight 0  No dyspnea for 1 flight stairs/hill -> Very dyspneic for 1 flight of stairs 2  No limitations for ADL at home -> Very limited with ADL at home 1  Confident leaving home -> Not at all confident leaving home 1  Sleep soundly -> Do not sleep soundly because of lung condition 0  Lots of Energy -> No energy at all 2  TOTAL Score (max 40)  9        Current outpatient prescriptions:  .  Aclidinium Bromide (TUDORZA PRESSAIR) 400  MCG/ACT AEPB, Inhale 1 puff into the lungs 2 (two) times daily. (Patient taking differently: Inhale 1 puff into the lungs 2 (two) times daily. ), Disp: 60 each, Rfl: 6 .  albuterol (PROAIR HFA) 108 (90 BASE) MCG/ACT inhaler, Inhale 2 puffs into the lungs every 6 (six) hours as needed for wheezing., Disp: , Rfl:  .  albuterol (PROVENTIL) (2.5 MG/3ML) 0.083% nebulizer solution, Take 3 mL (2.5 mg total) by nebulization every 6 (six) hours as needed for wheezing or shortness of breath (Dispense nebulizer kit with medication)., Disp: 75 mL, Rfl: 12 .  Calcium Carb-Cholecalciferol (CALCIUM 600 + D PO), Take 1 tablet by mouth daily., Disp: , Rfl:  .  Cholecalciferol (VITAMIN D-3) 1000 UNITS CAPS, Take 2,000 Units by mouth daily. , Disp: , Rfl:  .  hydrochlorothiazide (HYDRODIURIL) 25 MG tablet, Take 25 mg by mouth daily., Disp: , Rfl:  .  losartan (COZAAR) 50 MG tablet, Take 1 tablet (50 mg total) by mouth daily., Disp: 30 tablet, Rfl: 1 .  SYMBICORT 160-4.5 MCG/ACT inhaler, Inhale 2 puffs into the lungs 2 (two) times daily. , Disp: , Rfl:  .  atorvastatin (LIPITOR) 20 MG tablet, Take 1 tablet by mouth daily., Disp: , Rfl: 0  Review of Systems  Constitutional: Negative for fever and unexpected weight change.  HENT: Negative for congestion, dental problem, ear pain, nosebleeds, postnasal drip, rhinorrhea, sinus pressure, sneezing, sore throat and trouble swallowing.   Eyes: Negative for redness and itching.  Respiratory: Positive for cough and shortness of breath. Negative for chest tightness and wheezing.   Cardiovascular: Negative for palpitations and leg swelling.  Gastrointestinal: Negative for nausea and vomiting.  Genitourinary: Negative for dysuria.  Musculoskeletal: Negative for joint swelling.  Skin: Negative for rash.  Neurological: Negative for headaches.  Hematological: Does not bruise/bleed easily.  Psychiatric/Behavioral: Negative for dysphoric mood. The patient is not  nervous/anxious.        Objective:   Physical Exam  Constitutional: She is oriented to person, place, and time. She appears well-developed and well-nourished. No distress.  HENT:  Head: Normocephalic and atraumatic.  Right Ear: External ear normal.  Left Ear: External ear normal.  Mouth/Throat: Oropharynx is clear and moist. No oropharyngeal exudate.  Eyes: Conjunctivae and EOM are normal. Pupils are equal, round, and reactive to light. Right eye exhibits no discharge. Left eye exhibits no discharge. No scleral icterus.  Neck: Normal range of motion. Neck supple. No JVD present. No tracheal deviation present. No thyromegaly present.  Cardiovascular: Normal rate, regular rhythm, normal heart sounds and intact distal pulses.  Exam reveals no gallop and no friction rub.   No murmur heard. Pulmonary/Chest: Effort normal and breath sounds normal. No respiratory distress. She has no wheezes. She has no rales. She exhibits no tenderness.  Abdominal: Soft. Bowel sounds are normal. She exhibits no distension and no mass. There is no tenderness. There is no rebound and no guarding.  Musculoskeletal: Normal range of motion. She exhibits no edema or tenderness.  Lymphadenopathy:    She has no cervical adenopathy.  Neurological: She is alert and oriented to person, place, and time. She has normal reflexes. No cranial nerve deficit. She exhibits normal muscle tone. Coordination normal.  Skin: Skin is warm and dry. No rash noted. She is not diaphoretic. No erythema. No pallor.  Psychiatric: She has a normal mood and affect. Her behavior is normal. Judgment and thought content normal.  Vitals reviewed.   Filed Vitals:   05/23/15 1018  BP: 122/82  Pulse: 79  Height: '5\' 7"'  (1.702 m)  Weight: 184 lb 12.8 oz (83.825 kg)  SpO2: 97%         Assessment & Plan:     ICD-9-CM ICD-10-CM   1. Lung nodule 793.11 R91.1   2. Chronic obstructive pulmonary disease, unspecified COPD, unspecified chronic  bronchitis type 496 J44.9    #COPD - stable without exertional desaturation - - ok to stay off tudorza  - continue symbicort but reduce to 80/4.5,  2 puff twice daily  - use alb as needed   #Lung nodule  - ct scan chest wo contrast one year from 07/17/2014; in end sept 2016  #FOllowup  sept/oct 2016  spirometry without lung volmes Stevie Kern at followup     Dr. Brand Males, M.D., [facility].C.P Pulmonary and Critical Care Medicine Staff Physician [place] [facility] Pager: [phone], If no answer or between  15:00h - 7:00h: call [phone]  05/23/2015 10:36 AM

## 2015-05-23 NOTE — Patient Instructions (Addendum)
#  COPD - stable without exertional desaturation - - ok to stay off tudorza  - continue symbicort but reduce to 80/4.5,  2 puff twice daily  - use alb as needed   #Lung nodule  - ct scan chest wo contrast one year from 07/17/2014; in end sept 2016  #FOllowup  sept/oct 2016  spirometry without lung volmes Tamy Wilson at followup

## 2015-07-04 ENCOUNTER — Ambulatory Visit (INDEPENDENT_AMBULATORY_CARE_PROVIDER_SITE_OTHER)
Admission: RE | Admit: 2015-07-04 | Discharge: 2015-07-04 | Disposition: A | Discharge status: Home / Self Care | Payer: Medicare Other | Source: Ambulatory Visit | Attending: Internal Medicine | Admitting: Internal Medicine

## 2015-07-04 DIAGNOSIS — R911 Solitary pulmonary nodule: Secondary | ICD-10-CM | POA: Diagnosis not present

## 2015-07-05 ENCOUNTER — Telehealth: Payer: Self-pay | Admitting: Internal Medicine

## 2015-07-05 NOTE — Telephone Encounter (Signed)
Let her know  MPRESSION: 1. Seven scattered tiny pulmonary nodules throughout both lungs, largest 4 mm, all unchanged since 08/03/2013 (near two year stability), most in keeping with benign pulmonary nodules. 2. No active disease in the chest.   Electronically Signed By: Ilona Sorrel M.D. On: 07/04/2015 10:24   Ct Chest Wo Contrast  07/04/2015   CLINICAL DATA:  Follow-up pulmonary nodule.  EXAM: CT CHEST WITHOUT CONTRAST  TECHNIQUE: Multidetector CT imaging of the chest was performed following the standard protocol without IV contrast.  COMPARISON:  06/07/2014 chest CT.  FINDINGS: Mediastinum/Nodes: Normal heart size. No pericardial fluid/thickening. Great vessels are normal in course and caliber. Normal visualized thyroid. Normal esophagus. No axillary, mediastinal or gross hilar lymphadenopathy, noting limited sensitivity for the detection of hilar adenopathy on this noncontrast study.  Lungs/Pleura: No pneumothorax. No pleural effusion. No acute consolidative airspace disease. There are 5 scattered tiny subpleural pulmonary nodules in the right lung, all unchanged since 08/03/2013, largest 4 mm in the right lower lobe (series 3/image 31). There are 4 mm and 3 mm left lower lobe pulmonary nodules (series 3/image is 25 and 38), both unchanged since 08/03/2013. No new significant pulmonary nodules or lung masses.  Upper abdomen: Unremarkable.  Musculoskeletal: Mild to moderate degenerative changes in the thoracic spine. No aggressive appearing focal osseous lesions.  IMPRESSION: 1. Seven scattered tiny pulmonary nodules throughout both lungs, largest 4 mm, all unchanged since 08/03/2013 (near two year stability), most in keeping with benign pulmonary nodules. 2. No active disease in the chest.   Electronically Signed   By: Ilona Sorrel M.D.   On: 07/04/2015 10:24

## 2015-07-06 NOTE — Telephone Encounter (Signed)
Called and spoke to pt. Informed her of the results of CT chest. Pt verbalized understanding and denied any further questions or concerns at this time.

## 2015-07-23 ENCOUNTER — Ambulatory Visit (INDEPENDENT_AMBULATORY_CARE_PROVIDER_SITE_OTHER): Payer: Medicare Other | Admitting: Internal Medicine

## 2015-07-23 ENCOUNTER — Ambulatory Visit: Payer: Medicare Other | Admitting: Internal Medicine

## 2015-07-23 ENCOUNTER — Encounter: Payer: Self-pay | Admitting: Internal Medicine

## 2015-07-23 VITALS — BP 132/80 | HR 74 | Ht 67.0 in | Wt 182.0 lb

## 2015-07-23 DIAGNOSIS — J449 Chronic obstructive pulmonary disease, unspecified: Secondary | ICD-10-CM

## 2015-07-23 DIAGNOSIS — Z23 Encounter for immunization: Secondary | ICD-10-CM

## 2015-07-23 DIAGNOSIS — R911 Solitary pulmonary nodule: Secondary | ICD-10-CM

## 2015-07-23 LAB — PULMONARY FUNCTION TEST
FEF 25-75 PRE: 1.25 L/s
FEF 25-75 Post: 1.49 L/sec
FEF2575-%CHANGE-POST: 19 %
FEF2575-%Pred-Post: 83 %
FEF2575-%Pred-Pre: 70 %
FEV1-%CHANGE-POST: 4 %
FEV1-%PRED-POST: 79 %
FEV1-%Pred-Pre: 76 %
FEV1-Post: 1.93 L
FEV1-Pre: 1.85 L
FEV1FVC-%Change-Post: 2 %
FEV1FVC-%PRED-PRE: 99 %
FEV6-%Change-Post: 1 %
FEV6-%PRED-POST: 82 %
FEV6-%Pred-Pre: 81 %
FEV6-PRE: 2.51 L
FEV6-Post: 2.55 L
FEV6FVC-%PRED-PRE: 105 %
FEV6FVC-%Pred-Post: 105 %
FVC-%CHANGE-POST: 1 %
FVC-%PRED-POST: 79 %
FVC-%PRED-PRE: 77 %
FVC-PRE: 2.51 L
FVC-Post: 2.55 L
POST FEV1/FVC RATIO: 76 %
Post FEV6/FVC ratio: 100 %
Pre FEV1/FVC ratio: 74 %
Pre FEV6/FVC Ratio: 100 %

## 2015-07-23 NOTE — Progress Notes (Signed)
Subjective:    Patient ID: Kelly Delgado, female    DOB: 08-27-1936, 79 y.o.   MRN: [id number]  HPI  OV 07/23/2015  Chief Complaint  Patient presents with  . Follow-up    Pt here to discuss CT results and PFT results. Pt states breathing is better since last visit. She is now walking 2 miles three times a week     Follow-up Gold stage II COPD: Currently only on Symbicort. She stopped Spiriva due to itching. She also stopped [place] in past due to cost issues. With Symbicort she is doing really well. She is walking 2 miles a week. Her effort tolerance is improved. She is hardly ever symptomatic. FEV1 today 1.85 L/76% and a ratio of 74 showing very mild obstruction. There are no new issues. She will have a flu shot today. We discussed switching to a long-acting anticholinergic associated with long-acting beta agonist and skipping inhaled steroidal given recent history of hip fracture but she's not interested to change due to cost issues and because she's currently doing well  Multiple lung nodules: She had CT scan of the chest 08/03/2015: She has multiple lung nodules largest 4 mm. This is all stable since October 2014 completing near 2 year stability. She is currently 79 years of age and therefore will not qualify for future low-dose lung cancer screening CT   Review of Systems    per history of present illness Objective:   Physical Exam  Constitutional: She is oriented to person, place, and time. She appears well-developed and well-nourished. No distress.  HENT:  Head: Normocephalic and atraumatic.  Right Ear: External ear normal.  Left Ear: External ear normal.  Mouth/Throat: Oropharynx is clear and moist. No oropharyngeal exudate.  Eyes: Conjunctivae and EOM are normal. Pupils are equal, round, and reactive to light. Right eye exhibits no discharge. Left eye exhibits no discharge. No scleral icterus.  Neck: Normal range of motion. Neck supple. No JVD present. No tracheal deviation  present. No thyromegaly present.  Cardiovascular: Normal rate, regular rhythm, normal heart sounds and intact distal pulses.  Exam reveals no gallop and no friction rub.   No murmur heard. Pulmonary/Chest: Effort normal and breath sounds normal. No respiratory distress. She has no wheezes. She has no rales. She exhibits no tenderness.  Abdominal: Soft. Bowel sounds are normal. She exhibits no distension and no mass. There is no tenderness. There is no rebound and no guarding.  Musculoskeletal: Normal range of motion. She exhibits no edema or tenderness.  Lymphadenopathy:    She has no cervical adenopathy.  Neurological: She is alert and oriented to person, place, and time. She has normal reflexes. No cranial nerve deficit. She exhibits normal muscle tone. Coordination normal.  Skin: Skin is warm and dry. No rash noted. She is not diaphoretic. No erythema. No pallor.  Psychiatric: She has a normal mood and affect. Her behavior is normal. Judgment and thought content normal.  Vitals reviewed.   Filed Vitals:   07/23/15 1043  BP: 132/80  Pulse: 74  Height: 5\' 7"  (1.702 m)  Weight: 182 lb (82.555 kg)  SpO2: 97%         Assessment & Plan:  #COPD - stable without exertional desaturation - continue symbicort  80/4.5,  2 puff twice daily - will not try anticholinergic at your request  - use alb as needed - flu shot 07/23/2015   #Lung nodule  - ct scan chest wo contrast  Stable end sept 2016 - near 2  year stability  - no more CT chest unless clinically indicated  #FOllowup  9 months or sooner if needed   Dr. Brand Males, M.D., [facility].C.P Pulmonary and Critical Care Medicine Staff Physician [place] [facility] Pager: [phone], If no answer or between  15:00h - 7:00h: call [phone]  07/23/2015 11:10 AM

## 2015-07-23 NOTE — Progress Notes (Signed)
Spirometry pre and post done today. 

## 2015-07-23 NOTE — Patient Instructions (Addendum)
#  COPD - stable without exertional desaturation - continue symbicort  80/4.5,  2 puff twice daily - will not try anticholinergic at your request  - use alb as needed - flu shot 07/23/2015   #Lung nodule  - ct scan chest wo contrast  Stable end sept 2016 - near 2 year stability  - no more CT chest unless clinically indicated  #FOllowup  9 months or sooner if needed

## 2015-09-10 ENCOUNTER — Telehealth: Payer: Self-pay | Admitting: Internal Medicine

## 2015-09-10 MED ORDER — PREDNISONE 10 MG PO TABS: ORAL_TABLET | ORAL | Status: DC

## 2015-09-10 MED ORDER — LEVOFLOXACIN 500 MG PO TABS: 500.0000 mg | ORAL_TABLET | Freq: Every day | ORAL | Status: DC

## 2015-09-10 MED ORDER — BUDESONIDE-FORMOTEROL FUMARATE 160-4.5 MCG/ACT IN AERO: 2.0000 | INHALATION_SPRAY | Freq: Two times a day (BID) | RESPIRATORY_TRACT | Status: DC

## 2015-09-10 NOTE — Telephone Encounter (Signed)
She needs abx  So plan - go back to higher dose symbicort - do Take levaquin 561m daily x 5 days - do repeat Please take prednisone 40 mg x1 day, then 30 mg x1 day, then 20 mg x1 day, then 10 mg x1 day, and then 5 mg x1 day and stop   Allergies  Allergen Reactions  . Statins Other (See Comments)    Increased LFT's  . Spiriva [Tiotropium Bromide Monohydrate] Itching     Current outpatient prescriptions:  .  Aclidinium Bromide (TUDORZA PRESSAIR) 400 MCG/ACT AEPB, Inhale 1 puff into the lungs 2 (two) times daily. (Patient not taking: Reported on 07/23/2015), Disp: 60 each, Rfl: 6 .  albuterol (PROAIR HFA) 108 (90 BASE) MCG/ACT inhaler, Inhale 2 puffs into the lungs every 6 (six) hours as needed for wheezing., Disp: , Rfl:  .  albuterol (PROVENTIL) (2.5 MG/3ML) 0.083% nebulizer solution, Take 3 mL (2.5 mg total) by nebulization every 6 (six) hours as needed for wheezing or shortness of breath (Dispense nebulizer kit with medication)., Disp: 75 mL, Rfl: 12 .  atorvastatin (LIPITOR) 20 MG tablet, Take 1 tablet by mouth daily., Disp: , Rfl: 0 .  budesonide-formoterol (SYMBICORT) 80-4.5 MCG/ACT inhaler, Inhale 2 puffs into the lungs 2 (two) times daily., Disp: 1 Inhaler, Rfl: 12 .  Calcium Carb-Cholecalciferol (CALCIUM 600 + D PO), Take 1 tablet by mouth daily., Disp: , Rfl:  .  Cholecalciferol (VITAMIN D-3) 1000 UNITS CAPS, Take 2,000 Units by mouth daily. , Disp: , Rfl:  .  hydrochlorothiazide (HYDRODIURIL) 25 MG tablet, Take 25 mg by mouth daily., Disp: , Rfl:  .  losartan (COZAAR) 50 MG tablet, Take 1 tablet (50 mg total) by mouth daily., Disp: 30 tablet, Rfl: 1

## 2015-09-10 NOTE — Telephone Encounter (Signed)
   Patient was seen by PCP and they gave her prednisone.  She stopped prednisone a week ago and now she is flaring back up again.  Coughing up yellow/green mucus, wheezing, SOB. She said that she reduced her dose on Symbicort, she has been short of breath.  Pharmacy: [address]   Allergies  Allergen Reactions  . Statins Other (See Comments)    Increased LFT's  . Spiriva [Tiotropium Bromide Monohydrate] Itching

## 2015-09-10 NOTE — Telephone Encounter (Signed)
Patient notified of MR's recommendations. Rx sent to pharmacy. Nothing further needed. Closing encounter

## 2016-04-30 ENCOUNTER — Encounter: Payer: Self-pay | Admitting: Internal Medicine

## 2016-04-30 ENCOUNTER — Ambulatory Visit (INDEPENDENT_AMBULATORY_CARE_PROVIDER_SITE_OTHER): Payer: Medicare Other | Admitting: Internal Medicine

## 2016-04-30 VITALS — BP 120/86 | HR 65 | Ht 67.5 in | Wt 177.0 lb

## 2016-04-30 DIAGNOSIS — J449 Chronic obstructive pulmonary disease, unspecified: Secondary | ICD-10-CM | POA: Diagnosis not present

## 2016-04-30 NOTE — Patient Instructions (Addendum)
#  COPD - stable  - continue symbicort  80/4.5,  2 puff twice daily -  - use alb as needed - flu shot  Fall 2017 - monitor your scratchy throat;l if worse call us  #Lung nodule  - ct scan chest wo contrast  Stable end sept 2016 - near 2 year stability  - no more CT chest unless clinically indicated  #FOllowup  9 months or sooner if needed

## 2016-04-30 NOTE — Progress Notes (Signed)
Subjective:     Patient ID: Kelly Delgado, female   DOB: 1936/09/04, 80 y.o.   MRN: [id number]  HPI    OV 07/23/2015  Chief Complaint  Patient presents with  . Follow-up    Pt here to discuss CT results and PFT results. Pt states breathing is better since last visit. She is now walking 2 miles three times a week     Follow-up Gold stage II COPD: Currently only on Symbicort. She stopped Spiriva due to itching. She also stopped [place] in past due to cost issues. With Symbicort she is doing really well. She is walking 2 miles a week. Her effort tolerance is improved. She is hardly ever symptomatic. FEV1 today 1.85 L/76% and a ratio of 74 showing very mild obstruction. There are no new issues. She will have a flu shot today. We discussed switching to a long-acting anticholinergic associated with long-acting beta agonist and skipping inhaled steroidal given recent history of hip fracture but she's not interested to change due to cost issues and because she's currently doing well  Multiple lung nodules: She had CT scan of the chest 08/03/2015: She has multiple lung nodules largest 4 mm. This is all stable since October 2014 completing near 2 year stability. She is currently 80 years of age and therefore will not qualify for future low-dose lung cancer screening CT   OV 04/30/2016  Chief Complaint  Patient presents with  . Follow-up    breathing has been doing well.  no concerns.CAT Score: 11    Follow-up moderate COPD he is a 9 month routine follow-up. She is doing well. No urgent care visits. No emergency room visits. No prednisone use. No new medical problems diagnosis. [place] and now she has a scratchy throat but several weeks ago she had a cold and did not flareup in the COPD exacerbation. She wants to keep an eye on her cold. She's not interested in inhaler change. She likes her Symbicort. She has enough refills and will call us if needed. She's got good quality  of life.     has a past medical history of Hypertension; Basal cell carcinoma of breast (1990's?); Schatzki's ring; Asthmatic bronchitis; Shortness of breath; H/O hiatal hernia; GERD (gastroesophageal reflux disease); and Migraines.   reports that she quit smoking about 47 years ago. Her smoking use included Cigarettes. She has a 3.3 pack-year smoking history. She has never used smokeless tobacco.  Past Surgical History  Procedure Laterality Date  . Abdominal hysterectomy  1980's  . Dilation and curettage of uterus  1980's    "1" (06/07/2013)  . Urethral diverticulum repair  1970's  . Cataract extraction w/ intraocular lens  implant, bilateral Bilateral 1990's  . Esophageal dilation  2012    "just once" (06/07/2013)  . Hip pinning,cannulated Right 10/09/2014    Procedure: CANNULATED HIP PINNING;  Surgeon: Renette Butters, MD;  Location: [facility];  Service: Orthopedics;  Laterality: Right;    Allergies  Allergen Reactions  . Statins Other (See Comments)    Increased LFT's  . Spiriva [Tiotropium Bromide Monohydrate] Itching    Immunization History  Administered Date(s) Administered  . Influenza Whole 06/27/2012  . Influenza,inj,Quad PF,36+ Mos 07/17/2014, 07/23/2015  . Pneumococcal Conjugate-13 07/17/2014  . Pneumococcal-Unspecified 06/27/2012    Family History  Problem Relation Age of Onset  . Cancer Mother   . Stroke Mother   . Hypertension Mother      Current outpatient prescriptions:  .  albuterol (PROAIR HFA) 108 (90 BASE) MCG/ACT inhaler, Inhale 2 puffs into the lungs every 6 (six) hours as needed for wheezing., Disp: , Rfl:  .  albuterol (PROVENTIL) (2.5 MG/3ML) 0.083% nebulizer solution, Take 3 mL (2.5 mg total) by nebulization every 6 (six) hours as needed for wheezing or shortness of breath (Dispense nebulizer kit with medication)., Disp: 75 mL, Rfl: 12 .  budesonide-formoterol (SYMBICORT) 80-4.5 MCG/ACT inhaler, Inhale 2 puffs into the lungs 2 (two) times daily.  (Patient taking differently: Inhale 2 puffs into the lungs 2 (two) times daily. Pt has been taking 1 puff daily instead of 2 puffs.  Takes 2 puffs PRN), Disp: 1 Inhaler, Rfl: 12 .  Calcium Carb-Cholecalciferol (CALCIUM 600 + D PO), Take 1 tablet by mouth daily., Disp: , Rfl:  .  Cholecalciferol (VITAMIN D-3) 1000 UNITS CAPS, Take 2,000 Units by mouth daily. , Disp: , Rfl:  .  hydrochlorothiazide (HYDRODIURIL) 25 MG tablet, Take 25 mg by mouth daily., Disp: , Rfl:  .  losartan (COZAAR) 50 MG tablet, Take 1 tablet (50 mg total) by mouth daily., Disp: 30 tablet, Rfl: 1     Review of Systems     Objective:   Physical Exam  Constitutional: She is oriented to person, place, and time. She appears well-developed and well-nourished. No distress.  HENT:  Head: Normocephalic and atraumatic.  Right Ear: External ear normal.  Left Ear: External ear normal.  Mouth/Throat: Oropharynx is clear and moist. No oropharyngeal exudate.  Post nasal drip+  Eyes: Conjunctivae and EOM are normal. Pupils are equal, round, and reactive to light. Right eye exhibits no discharge. Left eye exhibits no discharge. No scleral icterus.  Neck: Normal range of motion. Neck supple. No JVD present. No tracheal deviation present. No thyromegaly present.  Cardiovascular: Normal rate, regular rhythm, normal heart sounds and intact distal pulses.  Exam reveals no gallop and no friction rub.   No murmur heard. Pulmonary/Chest: Effort normal and breath sounds normal. No respiratory distress. She has no wheezes. She has no rales. She exhibits no tenderness.  Abdominal: Soft. Bowel sounds are normal. She exhibits no distension and no mass. There is no tenderness. There is no rebound and no guarding.  Musculoskeletal: Normal range of motion. She exhibits no edema or tenderness.  Lymphadenopathy:    She has no cervical adenopathy.  Neurological: She is alert and oriented to person, place, and time. She has normal reflexes. No cranial  nerve deficit. She exhibits normal muscle tone. Coordination normal.  Skin: Skin is warm and dry. No rash noted. She is not diaphoretic. No erythema. No pallor.  Psychiatric: She has a normal mood and affect. Her behavior is normal. Judgment and thought content normal.  Vitals reviewed.   Filed Vitals:   04/30/16 1651  BP: 120/86  Pulse: 65  Height: 5' 7.5" (1.715 m)  Weight: 177 lb (80.287 kg)  SpO2: 94%        Assessment:       ICD-9-CM ICD-10-CM   1. Chronic obstructive pulmonary disease, unspecified COPD, unspecified chronic bronchitis type 496 J44.9    Stable disease    Plan:     COPD - stable  - continue symbicort  80/4.5,  2 puff twice daily -  - use alb as needed - flu shot  Fall 2017 - monitor your scratchy throat;l if worse call us  #Lung nodule  - ct scan chest wo contrast  Stable end sept 2016 - near 2 year stability  - no  more CT chest unless clinically indicated  #FOllowup  9 months or sooner if needed    Dr. Brand Males, M.D., [facility].C.P Pulmonary and Critical Care Medicine Staff Physician [place] [facility] Pager: [phone], If no answer or between  15:00h - 7:00h: call [phone]  04/30/2016 5:14 PM

## 2016-06-26 ENCOUNTER — Telehealth: Payer: Self-pay | Admitting: Internal Medicine

## 2016-06-26 MED ORDER — CEPHALEXIN 500 MG PO CAPS: 500.0000 mg | ORAL_CAPSULE | Freq: Three times a day (TID) | ORAL | 0 refills | Status: DC

## 2016-06-26 MED ORDER — PREDNISONE 10 MG PO TABS: ORAL_TABLET | ORAL | 0 refills | Status: DC

## 2016-06-26 NOTE — Telephone Encounter (Signed)
Spoke with pt. She is aware of MR's recommendation. Rxs has been sent in. Nothing further was needed.

## 2016-06-26 NOTE — Telephone Encounter (Signed)
Spoke with pt. States that she is getting a chest cold. Reports dry cough and wheezing. Denies chest tightness, SOB or fever. Has been using her albuterol solution and Mucinex with minimal relief. Believes she might need prednisone and an antibiotic.  MR - please advise. Thanks.

## 2016-06-26 NOTE — Telephone Encounter (Signed)
Agree she likely has aecopd with weather change  Allergies  Allergen Reactions  . Statins Other (See Comments)    Increased LFT's  . Spiriva [Tiotropium Bromide Monohydrate] Itching     PLAN  - Please take prednisone 40 mg x1 day, then 30 mg x1 day, then 20 mg x1 day, then 10 mg x1 day, and then 5 mg x1 day and stop  - cephalexin 500mg  tid x 5 days  - ER if worse  Dr. Brand Males, M.D., [facility].C.P Pulmonary and Critical Care Medicine Staff Physician [place] [facility] Pager: [phone], If no answer or between  15:00h - 7:00h: call [phone]  06/26/2016 11:30 AM

## 2016-07-03 ENCOUNTER — Other Ambulatory Visit: Payer: Self-pay | Admitting: Family Medicine

## 2016-07-03 DIAGNOSIS — R748 Abnormal levels of other serum enzymes: Secondary | ICD-10-CM

## 2016-07-09 ENCOUNTER — Ambulatory Visit
Admission: RE | Admit: 2016-07-09 | Discharge: 2016-07-09 | Disposition: A | Discharge status: Home / Self Care | Payer: Medicare Other | Source: Ambulatory Visit | Attending: Family Medicine | Admitting: Family Medicine

## 2016-07-09 DIAGNOSIS — R748 Abnormal levels of other serum enzymes: Secondary | ICD-10-CM

## 2016-07-09 MED ORDER — IOPAMIDOL (ISOVUE-300) INJECTION 61%
100.0000 mL | Freq: Once | INTRAVENOUS | Status: AC | PRN
  Administered 2016-07-09: 100 mL via INTRAVENOUS

## 2016-07-31 ENCOUNTER — Telehealth: Payer: Self-pay | Admitting: Internal Medicine

## 2016-07-31 NOTE — Telephone Encounter (Signed)
  In sept 2017 CT abd lung cut - small lung nodules seen. Most likely is same from CT chest a year ago but in one of the nodules radiologist not sure. Nevetheless is 51mm and likely benign.I can d./w her at fu. Let her know  Thanks  Dr. Brand Males, M.D., [facility].C.P Pulmonary and Critical Care Medicine Staff Physician [place] [facility] Pager: [phone], If no answer or between  15:00h - 7:00h: call [phone]  07/31/2016 1:25 PM      Two left lower lobe pulmonary nodules measuring up to 3 mm, one of which was not well visualized on prior CT chest, although likely benign. No follow-up needed if patient is low-risk (and has no known or suspected primary neoplasm). Non-contrast chest CT can be considered in 12 months if patient is high-risk. This recommendation follows the consensus statement: Guidelines for Management of Incidental Pulmonary Nodules Detected on CT Images:From the Fleischner Society 2017; published online before print (10.1148/radiol.SG:5268862).  Otherwise negative CT of the abdomen.  Please note that the pelvis was not imaged.

## 2016-07-31 NOTE — Telephone Encounter (Signed)
Called and spoke to pt. Informed her of the results and recs per MR. Pt verbalized understanding and denied any further questions or concerns at this time.   

## 2016-09-24 ENCOUNTER — Other Ambulatory Visit: Payer: Self-pay | Admitting: Internal Medicine

## 2016-09-26 MED ORDER — BUDESONIDE-FORMOTEROL FUMARATE 80-4.5 MCG/ACT IN AERO: 2.0000 | INHALATION_SPRAY | Freq: Two times a day (BID) | RESPIRATORY_TRACT | 12 refills | Status: DC

## 2016-11-24 ENCOUNTER — Emergency Department (HOSPITAL_COMMUNITY): Payer: Medicare Other

## 2016-11-24 ENCOUNTER — Observation Stay (HOSPITAL_COMMUNITY)
Admission: EM | Admit: 2016-11-24 | Discharge: 2016-11-25 | Disposition: A | Discharge status: Home / Self Care | Payer: Medicare Other | Attending: Internal Medicine | Admitting: Internal Medicine

## 2016-11-24 ENCOUNTER — Encounter (HOSPITAL_COMMUNITY): Payer: Self-pay | Admitting: Emergency Medicine

## 2016-11-24 DIAGNOSIS — I10 Essential (primary) hypertension: Secondary | ICD-10-CM | POA: Insufficient documentation

## 2016-11-24 DIAGNOSIS — Z7982 Long term (current) use of aspirin: Secondary | ICD-10-CM | POA: Diagnosis not present

## 2016-11-24 DIAGNOSIS — Z87891 Personal history of nicotine dependence: Secondary | ICD-10-CM | POA: Diagnosis not present

## 2016-11-24 DIAGNOSIS — Z7951 Long term (current) use of inhaled steroids: Secondary | ICD-10-CM | POA: Diagnosis not present

## 2016-11-24 DIAGNOSIS — Z79899 Other long term (current) drug therapy: Secondary | ICD-10-CM | POA: Diagnosis not present

## 2016-11-24 DIAGNOSIS — J9601 Acute respiratory failure with hypoxia: Secondary | ICD-10-CM | POA: Diagnosis not present

## 2016-11-24 DIAGNOSIS — Z85828 Personal history of other malignant neoplasm of skin: Secondary | ICD-10-CM | POA: Diagnosis not present

## 2016-11-24 DIAGNOSIS — J441 Chronic obstructive pulmonary disease with (acute) exacerbation: Secondary | ICD-10-CM | POA: Diagnosis not present

## 2016-11-24 LAB — CBC WITH DIFFERENTIAL/PLATELET
BASOS ABS: 0 10*3/uL (ref 0.0–0.1)
Basophils Relative: 0 %
EOS ABS: 0 10*3/uL (ref 0.0–0.7)
Eosinophils Relative: 0 %
HEMATOCRIT: 42 % (ref 36.0–46.0)
Hemoglobin: 14.5 g/dL (ref 12.0–15.0)
Lymphocytes Relative: 12 %
Lymphs Abs: 0.9 10*3/uL (ref 0.7–4.0)
MCH: 29.5 pg (ref 26.0–34.0)
MCHC: 34.5 g/dL (ref 30.0–36.0)
MCV: 85.5 fL (ref 78.0–100.0)
MONOS PCT: 6 %
Monocytes Absolute: 0.5 10*3/uL (ref 0.1–1.0)
NEUTROS ABS: 5.8 10*3/uL (ref 1.7–7.7)
Neutrophils Relative %: 82 %
Platelets: 140 10*3/uL — ABNORMAL LOW (ref 150–400)
RBC: 4.91 MIL/uL (ref 3.87–5.11)
RDW: 13.8 % (ref 11.5–15.5)
WBC: 7.2 10*3/uL (ref 4.0–10.5)

## 2016-11-24 LAB — URINALYSIS, ROUTINE W REFLEX MICROSCOPIC
Bilirubin Urine: NEGATIVE
GLUCOSE, UA: NEGATIVE mg/dL
KETONES UR: NEGATIVE mg/dL
LEUKOCYTES UA: NEGATIVE
Nitrite: NEGATIVE
PH: 6 (ref 5.0–8.0)
PROTEIN: NEGATIVE mg/dL
Specific Gravity, Urine: 1.006 (ref 1.005–1.030)

## 2016-11-24 LAB — CBC
HCT: 39.9 % (ref 36.0–46.0)
Hemoglobin: 13.1 g/dL (ref 12.0–15.0)
MCH: 28.4 pg (ref 26.0–34.0)
MCHC: 32.8 g/dL (ref 30.0–36.0)
MCV: 86.4 fL (ref 78.0–100.0)
PLATELETS: 130 10*3/uL — AB (ref 150–400)
RBC: 4.62 MIL/uL (ref 3.87–5.11)
RDW: 13.8 % (ref 11.5–15.5)
WBC: 4.1 10*3/uL (ref 4.0–10.5)

## 2016-11-24 LAB — COMPREHENSIVE METABOLIC PANEL
ALK PHOS: 205 U/L — AB (ref 38–126)
ALT: 31 U/L (ref 14–54)
ANION GAP: 12 (ref 5–15)
AST: 35 U/L (ref 15–41)
Albumin: 3.6 g/dL (ref 3.5–5.0)
BUN: 17 mg/dL (ref 6–20)
CALCIUM: 8.8 mg/dL — AB (ref 8.9–10.3)
CO2: 22 mmol/L (ref 22–32)
CREATININE: 0.84 mg/dL (ref 0.44–1.00)
Chloride: 100 mmol/L — ABNORMAL LOW (ref 101–111)
Glucose, Bld: 135 mg/dL — ABNORMAL HIGH (ref 65–99)
Potassium: 3.8 mmol/L (ref 3.5–5.1)
Sodium: 134 mmol/L — ABNORMAL LOW (ref 135–145)
TOTAL PROTEIN: 7.5 g/dL (ref 6.5–8.1)
Total Bilirubin: 0.7 mg/dL (ref 0.3–1.2)

## 2016-11-24 LAB — CREATININE, SERUM: CREATININE: 0.67 mg/dL (ref 0.44–1.00)

## 2016-11-24 MED ORDER — SODIUM CHLORIDE 0.9 % IV BOLUS (SEPSIS)
500.0000 mL | Freq: Once | INTRAVENOUS | Status: AC
  Administered 2016-11-24: 500 mL via INTRAVENOUS

## 2016-11-24 MED ORDER — LOSARTAN POTASSIUM 50 MG PO TABS
50.0000 mg | ORAL_TABLET | Freq: Every day | ORAL | Status: DC
  Administered 2016-11-24 – 2016-11-25 (×2): 50 mg via ORAL
  Filled 2016-11-24 (×2): qty 1

## 2016-11-24 MED ORDER — DEXTROSE 5 % IV SOLN: 500.0000 mg | INTRAVENOUS | Status: DC

## 2016-11-24 MED ORDER — CEFTRIAXONE SODIUM 1 G IJ SOLR
1.0000 g | Freq: Once | INTRAMUSCULAR | Status: AC
  Administered 2016-11-24: 1 g via INTRAVENOUS
  Filled 2016-11-24: qty 10

## 2016-11-24 MED ORDER — ENOXAPARIN SODIUM 40 MG/0.4ML ~~LOC~~ SOLN
40.0000 mg | SUBCUTANEOUS | Status: DC
  Administered 2016-11-24: 40 mg via SUBCUTANEOUS
  Filled 2016-11-24: qty 0.4

## 2016-11-24 MED ORDER — DEXTROSE 5 % IV SOLN: 1.0000 g | INTRAVENOUS | Status: DC

## 2016-11-24 MED ORDER — ALBUTEROL SULFATE (2.5 MG/3ML) 0.083% IN NEBU
5.0000 mg | INHALATION_SOLUTION | RESPIRATORY_TRACT | Status: DC | PRN
  Administered 2016-11-24: 5 mg via RESPIRATORY_TRACT
  Filled 2016-11-24: qty 6

## 2016-11-24 MED ORDER — PREDNISONE 50 MG PO TABS
50.0000 mg | ORAL_TABLET | Freq: Every day | ORAL | Status: DC
  Administered 2016-11-25: 50 mg via ORAL
  Filled 2016-11-24: qty 1

## 2016-11-24 MED ORDER — SODIUM CHLORIDE 0.9 % IV BOLUS (SEPSIS)
1000.0000 mL | Freq: Once | INTRAVENOUS | Status: AC
  Administered 2016-11-24: 1000 mL via INTRAVENOUS

## 2016-11-24 MED ORDER — MOMETASONE FURO-FORMOTEROL FUM 100-5 MCG/ACT IN AERO
2.0000 | INHALATION_SPRAY | Freq: Two times a day (BID) | RESPIRATORY_TRACT | Status: DC
  Administered 2016-11-24 – 2016-11-25 (×2): 2 via RESPIRATORY_TRACT
  Filled 2016-11-24: qty 8.8

## 2016-11-24 MED ORDER — METHYLPREDNISOLONE SODIUM SUCC 125 MG IJ SOLR
125.0000 mg | Freq: Once | INTRAMUSCULAR | Status: AC
Start: 2016-11-24 — End: 2016-11-24
  Administered 2016-11-24: 125 mg via INTRAVENOUS
  Filled 2016-11-24: qty 2

## 2016-11-24 MED ORDER — DEXTROSE 5 % IV SOLN
500.0000 mg | Freq: Once | INTRAVENOUS | Status: AC
  Administered 2016-11-24: 500 mg via INTRAVENOUS
  Filled 2016-11-24: qty 500

## 2016-11-24 MED ORDER — ALBUTEROL SULFATE (2.5 MG/3ML) 0.083% IN NEBU
5.0000 mg | INHALATION_SOLUTION | RESPIRATORY_TRACT | Status: AC | PRN
  Administered 2016-11-24: 5 mg via RESPIRATORY_TRACT
  Filled 2016-11-24: qty 6

## 2016-11-24 MED ORDER — ASPIRIN EC 81 MG PO TBEC
81.0000 mg | DELAYED_RELEASE_TABLET | Freq: Every day | ORAL | Status: DC
  Administered 2016-11-25: 81 mg via ORAL
  Filled 2016-11-24 (×2): qty 1

## 2016-11-24 NOTE — ED Notes (Signed)
Pt attempted to use restroom was unsuccessful, will try again.

## 2016-11-24 NOTE — ED Provider Notes (Signed)
[facility] DEPT Provider Note   CSN: [id number] Arrival date & time: 11/24/16  1008     History   Chief Complaint Chief Complaint  Patient presents with  . Weakness  . Cough    HPI Kelly Delgado is a 81 y.o. female.  HPI Patient presents to the emergency room for evaluation of cough, fever and congestion. Patient states her symptoms started around Thursday. She began having some cough, congestion and wheezing. Her symptoms were mild at that point she did not feel she needed to see a doctor. Over the weekend her symptoms have gotten more severe. She has been coughing more. She's had fevers. At home today she felt weak and started to become a little short of breath. She also has been having diffuse body aches. She called EMS.  She does have history of COPD. She has been using her breathing treatments. She denies any vomiting or diarrhea. No dysuria. She did get a flu shot this year. Past Medical History:  Diagnosis Date  . Asthmatic bronchitis   . Basal cell carcinoma of breast 1990's?   "left side, burned it off" (06/07/2013)  . GERD (gastroesophageal reflux disease)   . H/O hiatal hernia   . Hypertension   . Migraines    "once a year usually" (06/07/2013)  . Schatzki's ring   . Shortness of breath    "related to asthmatic bronchitis only" (06/07/2013)    Patient Active Problem List   Diagnosis Date Noted  . Chronic obstructive pulmonary disease, unspecified copd, unspecified chronic bronchitis type 04/30/2016  . Hip fracture, right ([place]) 10/08/2014  . COLD (chronic obstructive lung disease) ([place]) 07/17/2014  . Lung nodule 09/18/2013  . Coronary artery calcification 09/18/2013  . Essential hypertension 09/18/2013  . Dyspnea and respiratory abnormality 07/29/2013  . Asthmatic bronchitis 06/07/2013    Past Surgical History:  Procedure Laterality Date  . ABDOMINAL HYSTERECTOMY  1980's  . CATARACT EXTRACTION W/ INTRAOCULAR LENS  IMPLANT, BILATERAL Bilateral 1990's  .  DILATION AND CURETTAGE OF UTERUS  1980's   "1" (06/07/2013)  . ESOPHAGEAL DILATION  2012   "just once" (06/07/2013)  . HIP PINNING,CANNULATED Right 10/09/2014   Procedure: CANNULATED HIP PINNING;  Surgeon: Renette Butters, MD;  Location: [facility];  Service: Orthopedics;  Laterality: Right;  . URETHRAL DIVERTICULUM REPAIR  1970's    OB History    Gravida Para Term Preterm AB Living   _0 SAB TAB Ectopic Multiple Live Births                   Home Medications    Prior to Admission medications   Medication Sig Start Date End Date Taking? Authorizing Provider  albuterol (PROAIR HFA) 108 (90 BASE) MCG/ACT inhaler Inhale 2 puffs into the lungs every 6 (six) hours as needed for wheezing.   Yes Historical Provider, MD  albuterol (PROVENTIL) (2.5 MG/3ML) 0.083% nebulizer solution Take 3 mL (2.5 mg total) by nebulization every 6 (six) hours as needed for wheezing or shortness of breath (Dispense nebulizer kit with medication). 06/09/13  Yes Thurnell Lose, MD  aspirin EC 81 MG tablet Take 81 mg by mouth daily.   Yes Historical Provider, MD  budesonide-formoterol (SYMBICORT) 80-4.5 MCG/ACT inhaler Inhale 2 puffs into the lungs 2 (two) times daily. 09/26/16  Yes Brand Males, MD  Calcium Carb-Cholecalciferol (CALCIUM 600 + D PO) Take 1 tablet by mouth every other day.    Yes Historical Provider,  MD  Cholecalciferol (VITAMIN D-3) 1000 UNITS CAPS Take 2,000 Units by mouth daily.    Yes Historical Provider, MD  hydrochlorothiazide (HYDRODIURIL) 25 MG tablet Take 25 mg by mouth daily as needed (high blood pressure).    Yes Historical Provider, MD  losartan (COZAAR) 50 MG tablet Take 1 tablet (50 mg total) by mouth daily. 09/07/13  Yes Brand Males, MD  cephALEXin (KEFLEX) 500 MG capsule Take 1 capsule (500 mg total) by mouth 3 (three) times daily. Patient not taking: Reported on 11/24/2016 06/26/16   Brand Males, MD  predniSONE (DELTASONE) 10 MG tablet 40 mg x1 day, 30 mg x1 day, 20  mg x1 day, 10 mg x1 day, 5 mg x1 day and stop Patient not taking: Reported on 11/24/2016 06/26/16   Brand Males, MD    Family History Family History  Problem Relation Age of Onset  . Cancer Mother   . Stroke Mother   . Hypertension Mother     Social History Social History  Substance Use Topics  . Smoking status: Former Smoker    Packs/day: 0.33    Years: 10.00    Types: Cigarettes    Quit date: 11/27/1968  . Smokeless tobacco: Never Used  . Alcohol use 0.0 oz/week     Comment: 06/07/2013 "glass of wine q month or 2"      Allergies   Statins and Spiriva [tiotropium bromide monohydrate]   Review of Systems Review of Systems  All other systems reviewed and are negative.    Physical Exam Updated Vital Signs BP 156/92 (BP Location: Left Arm)   Pulse 81   Temp 101.1 F (38.4 C) (Rectal)   Resp 25   SpO2 95%   Physical Exam  Constitutional: No distress.  HENT:  Head: Normocephalic and atraumatic.  Right Ear: External ear normal.  Left Ear: External ear normal.  Mouth/Throat: No oropharyngeal exudate.  Eyes: Conjunctivae are normal. Right eye exhibits no discharge. Left eye exhibits no discharge. No scleral icterus.  Neck: Neck supple. No tracheal deviation present.  Cardiovascular: Normal rate, regular rhythm and intact distal pulses.   Pulmonary/Chest: Effort normal. No stridor. No respiratory distress. She has wheezes. She has no rales.  Abdominal: Soft. Bowel sounds are normal. She exhibits no distension. There is no tenderness. There is no rebound and no guarding.  Musculoskeletal: She exhibits no edema or tenderness.  Neurological: She is alert. She has normal strength. No cranial nerve deficit (no facial droop, extraocular movements intact, no slurred speech) or sensory deficit. She exhibits normal muscle tone. She displays no seizure activity. Coordination normal.  Skin: Skin is warm and dry. No rash noted. She is not diaphoretic.  Psychiatric: She has a  normal mood and affect.  Nursing note and vitals reviewed.    ED Treatments / Results  Labs (all labs ordered are listed, but only abnormal results are displayed) Labs Reviewed  COMPREHENSIVE METABOLIC PANEL - Abnormal; Notable for the following:       Result Value   Sodium 134 (*)    Chloride 100 (*)    Glucose, Bld 135 (*)    Calcium 8.8 (*)    Alkaline Phosphatase 205 (*)    All other components within normal limits  CBC WITH DIFFERENTIAL/PLATELET - Abnormal; Notable for the following:    Platelets 140 (*)    All other components within normal limits  CULTURE, BLOOD (ROUTINE X 2)  CULTURE, BLOOD (ROUTINE X 2)  URINE CULTURE  URINALYSIS, ROUTINE W REFLEX MICROSCOPIC  EKG  EKG Interpretation  Date/Time:  Monday November 24 2016 10:17:44 EST Ventricular Rate:  97 PR Interval:    QRS Duration: 90 QT Interval:  355 QTC Calculation: 451 R Axis:   -31 Text Interpretation:  Sinus rhythm Left axis deviation Borderline T abnormalities, anterior leads No STEMI.  Confirmed by LONG MD, JOSHUA [phone]) on 11/24/2016 10:24:36 AM       Radiology Dg Chest 2 View  Result Date: 11/24/2016 CLINICAL DATA:  Increased cough, congestion, shortness of breath, generalized weakness and fever over last 2 days, history hypertension, GERD, asthmatic bronchitis EXAM: CHEST  2 VIEW COMPARISON:  06/09/2013 FINDINGS: Normal heart size, mediastinal contours, and pulmonary vascularity. Chronic bronchitic changes. Chronic accentuation of basilar markings appears unchanged. No definite acute infiltrate, pleural effusion or pneumothorax. Bones demineralized. IMPRESSION: Chronic bronchitic changes and accentuation of basilar markings without definite acute infiltrate. Electronically Signed   By: Lavonia Dana M.D.   On: 11/24/2016 11:42    Procedures Procedures (including critical care time)  Medications Ordered in ED Medications  albuterol (PROVENTIL) (2.5 MG/3ML) 0.083% nebulizer solution 5 mg (5 mg  Nebulization Given 11/24/16 1239)  cefTRIAXone (ROCEPHIN) 1 g in dextrose 5 % 50 mL IVPB (not administered)  azithromycin (ZITHROMAX) 500 mg in dextrose 5 % 250 mL IVPB (not administered)  methylPREDNISolone sodium succinate (SOLU-MEDROL) 125 mg/2 mL injection 125 mg (not administered)  sodium chloride 0.9 % bolus 1,000 mL (1,000 mLs Intravenous New Bag/Given 11/24/16 1038)    And  sodium chloride 0.9 % bolus 1,000 mL (1,000 mLs Intravenous New Bag/Given 11/24/16 1153)    And  sodium chloride 0.9 % bolus 500 mL (500 mLs Intravenous New Bag/Given 11/24/16 1153)  cefTRIAXone (ROCEPHIN) 1 g in dextrose 5 % 50 mL IVPB (0 g Intravenous Stopped 11/24/16 1139)  azithromycin (ZITHROMAX) 500 mg in dextrose 5 % 250 mL IVPB (500 mg Intravenous New Bag/Given 11/24/16 1112)     Initial Impression / Assessment and Plan / ED Course  I have reviewed the triage vital signs and the nursing notes.  Pertinent labs & imaging results that were available during my care of the patient were reviewed by me and considered in my medical decision making (see chart for details).  Clinical Course as of Nov 24 1256  Mon Nov 24, 2016  1236 I was called to see patient because I was told she is having chest pain.  Pt tells me she is not having pain she is having trouble breathing.  She was wheezing when I first saw her.  She was not given her albuterol treatment yet.  Will add on steroids.  [JK]    Clinical Course User Index [JK] Dorie Rank, MD   Empiric abx for pna started but no definite infiltrate on CXR.  Sx are concerning for influenza with COPD exacerbation.  Pt has a new oxygen requirement.  Started on breathing treatments and steroids.  Admit for further treatment.  Final Clinical Impressions(s) / ED Diagnoses   Final diagnoses:  COPD exacerbation [facility])    New Prescriptions New Prescriptions   No medications on file     Dorie Rank, MD 11/24/16 1258

## 2016-11-24 NOTE — ED Triage Notes (Signed)
Per [facility] pt from home , reports productive cough x 2 days, low O2 sat at the scene , 89 % RA. increased to 94 % after O2 5 L [place]. Bilateral  Expiratory rhonchi per [facility]. Weakness x 2 days. Neuro intact . 1000 mg tylenol PO by EMS. Fever 101 temporal . Alert and oriented x 4.

## 2016-11-24 NOTE — ED Notes (Signed)
20 gauge IV charted today as being in [facility] is Actually in [facility]

## 2016-11-24 NOTE — H&P (Signed)
History and Physical   Kelly Delgado JDB:[id number] DOB: 1935-11-04 DOA: 11/24/2016  Referring MD/NP/PA: Dr. Tomi Bamberger, [place] PCP: Cammy Copa, MD  Patient coming from: Home  Chief Complaint: Cough and weakness  HPI: Kelly Delgado is a 81 y.o. female with with a history of COPD who presented to the ED for cough, fever and weakness.  She began feeling ill 4 days ago with nonproductive cough and intermittent wheezing that worsened over the next few days. She began feeling more weak 2 days ago and noticed fever that has been intermittent since then as well. This morning she began feeling short of breath with any exertion at all, prompting evaluation in the ED.   She was dyspneic on arrival with oxygen saturations 88% on room air at rest improved to 94% with 4L by nasal cannula. She was wheezing, improved with nebulizer treatments, and was given solumedrol. She was given empiric antibiotics for pneumonia though CXR showed bronchitic changes and no infiltrate. She continued to feel weak and [facility] called to admit for COPD exacerbation.   Review of Systems: Per HPI. All others reviewed and are negative.   Past Medical History:  Diagnosis Date  . Asthmatic bronchitis   . Basal cell carcinoma of breast 1990's?   "left side, burned it off" (06/07/2013)  . GERD (gastroesophageal reflux disease)   . H/O hiatal hernia   . Hypertension   . Migraines    "once a year usually" (06/07/2013)  . Schatzki's ring   . Shortness of breath    "related to asthmatic bronchitis only" (06/07/2013)    Past Surgical History:  Procedure Laterality Date  . ABDOMINAL HYSTERECTOMY  1980's  . CATARACT EXTRACTION W/ INTRAOCULAR LENS  IMPLANT, BILATERAL Bilateral 1990's  . DILATION AND CURETTAGE OF UTERUS  1980's   "1" (06/07/2013)  . ESOPHAGEAL DILATION  2012   "just once" (06/07/2013)  . HIP PINNING,CANNULATED Right 10/09/2014   Procedure: CANNULATED HIP PINNING;  Surgeon: Renette Butters, MD;  Location: [facility];   Service: Orthopedics;  Laterality: Right;  . URETHRAL DIVERTICULUM REPAIR  1970's    - Remote history of smoking. Lives with husband in [place]. Son lives with them for now. No EtOH. Denies illicit drugs.   reports that she quit smoking about 48 years ago. Her smoking use included Cigarettes. She has a 3.30 pack-year smoking history. She has never used smokeless tobacco. She reports that she drinks alcohol. She reports that she does not use drugs.  Allergies  Allergen Reactions  . Statins Other (See Comments)    Increased LFT's  . Spiriva [Tiotropium Bromide Monohydrate] Itching    Family History  Problem Relation Age of Onset  . Cancer Mother   . Stroke Mother   . Hypertension Mother    - Family history otherwise reviewed and not pertinent.  Prior to Admission medications   Medication Sig Start Date End Date Taking? Authorizing Provider  albuterol (PROAIR HFA) 108 (90 BASE) MCG/ACT inhaler Inhale 2 puffs into the lungs every 6 (six) hours as needed for wheezing.   Yes Historical Provider, MD  albuterol (PROVENTIL) (2.5 MG/3ML) 0.083% nebulizer solution Take 3 mL (2.5 mg total) by nebulization every 6 (six) hours as needed for wheezing or shortness of breath (Dispense nebulizer kit with medication). 06/09/13  Yes Thurnell Lose, MD  aspirin EC 81 MG tablet Take 81 mg by mouth daily.   Yes Historical Provider, MD  budesonide-formoterol (SYMBICORT) 80-4.5 MCG/ACT inhaler Inhale 2 puffs into the lungs 2 (two) times  daily. 09/26/16  Yes Brand Males, MD  Calcium Carb-Cholecalciferol (CALCIUM 600 + D PO) Take 1 tablet by mouth every other day.    Yes Historical Provider, MD  Cholecalciferol (VITAMIN D-3) 1000 UNITS CAPS Take 2,000 Units by mouth daily.    Yes Historical Provider, MD  hydrochlorothiazide (HYDRODIURIL) 25 MG tablet Take 25 mg by mouth daily as needed (high blood pressure).    Yes Historical Provider, MD  losartan (COZAAR) 50 MG tablet Take 1 tablet (50 mg total) by mouth  daily. 09/07/13  Yes Brand Males, MD  cephALEXin (KEFLEX) 500 MG capsule Take 1 capsule (500 mg total) by mouth 3 (three) times daily. Patient not taking: Reported on 11/24/2016 06/26/16   Brand Males, MD  predniSONE (DELTASONE) 10 MG tablet 40 mg x1 day, 30 mg x1 day, 20 mg x1 day, 10 mg x1 day, 5 mg x1 day and stop Patient not taking: Reported on 11/24/2016 06/26/16   Brand Males, MD    Physical Exam: Vitals:   11/24/16 1300 11/24/16 1317 11/24/16 1400 11/24/16 1446  BP: 142/79  156/81 (!) 165/81  Pulse: 92  83 80  Resp: _0 Temp:    98.1 F (36.7 C)  TempSrc:    Oral  SpO2: 90% 94% 93% 94%  Weight:  81.6 kg (180 lb)    Height:  _1  (1.727 m)     Constitutional: 81 y.o. female in no distress, calm demeanor Eyes: Lids and conjunctivae normal, PERRL ENMT: Mucous membranes are moist. Posterior pharynx clear of any exudate or lesions. Normal dentition.  Neck: normal, supple, no masses, no thyromegaly Respiratory: Non-labored breathing 2L by [place] without accessory muscle use. Scattered rhonchi bilaterally and end-expiratory wheezes without prolongation. Cardiovascular: Regular rate and rhythm, no murmurs, rubs, or gallops. No carotid bruits. No JVD. No LE edema. 1+ pedal pulses. Abdomen: Normoactive bowel sounds. No tenderness, non-distended, and no masses palpated. No hepatosplenomegaly. GU: No indwelling catheter Musculoskeletal: No clubbing / cyanosis. No joint deformity upper and lower extremities. Good ROM, no contractures. Normal muscle tone.  Skin: Warm, dry. No rashes, wounds, no ulcers. No significant lesions noted.  Neurologic: CN II-XII grossly intact. Gait narrow-based. Speech normal. No focal deficits in motor strength or sensation in all extremities.  Psychiatric: Alert and oriented x3. Normal judgment and insight. Mood euthymic with congruent affect.   Labs on Admission: I have personally reviewed following labs and imaging studies  CBC:  Recent  Labs Lab 11/24/16 1025  WBC 7.2  NEUTROABS 5.8  HGB 14.5  HCT 42.0  MCV 85.5  PLT 892*   Basic Metabolic Panel:  Recent Labs Lab 11/24/16 1025  NA 134*  K 3.8  CL 100*  CO2 22  GLUCOSE 135*  BUN 17  CREATININE 0.84  CALCIUM 8.8*   GFR: Estimated Creatinine Clearance: 61.9 mL/min (by C-G formula based on SCr of 0.84 mg/dL). Liver Function Tests:  Recent Labs Lab 11/24/16 1025  AST 35  ALT 31  ALKPHOS 205*  BILITOT 0.7  PROT 7.5  ALBUMIN 3.6   Urine analysis:    Component Value Date/Time   COLORURINE YELLOW 11/24/2016 [place] 11/24/2016 1243   LABSPEC 1.006 11/24/2016 1243   PHURINE 6.0 11/24/2016 1243   GLUCOSEU NEGATIVE 11/24/2016 1243   HGBUR SMALL (A) 11/24/2016 1243   BILIRUBINUR NEGATIVE 11/24/2016 [place] 11/24/2016 1243   PROTEINUR NEGATIVE 11/24/2016 1243   UROBILINOGEN 0.2 10/08/2014 2129   NITRITE NEGATIVE 11/24/2016  [place] 11/24/2016 1243   Radiological Exams on Admission: Dg Chest 2 View  Result Date: 11/24/2016 CLINICAL DATA:  Increased cough, congestion, shortness of breath, generalized weakness and fever over last 2 days, history hypertension, GERD, asthmatic bronchitis EXAM: CHEST  2 VIEW COMPARISON:  06/09/2013 FINDINGS: Normal heart size, mediastinal contours, and pulmonary vascularity. Chronic bronchitic changes. Chronic accentuation of basilar markings appears unchanged. No definite acute infiltrate, pleural effusion or pneumothorax. Bones demineralized. IMPRESSION: Chronic bronchitic changes and accentuation of basilar markings without definite acute infiltrate. Electronically Signed   By: Lavonia Dana M.D.   On: 11/24/2016 11:42   EKG: Independently reviewed. NSR, LAD, QRS narrow and ST segments normal.  Assessment/Plan Active Problems:   COPD exacerbation (HCC)   Acute COPD exacerbation: Likely triggered by viral URI. CXR without consolidation or pulmonary edema. Has a  history of requiring oxygen after acute illnesses. Follow up with Dr. Chase Caller in April 2018. - Oxygen by nasal cannula to maintain saturations 88-92% - Check viral panel, empiric droplet precautions until resulted. - Given abx in ED, but with no infiltrate, will hold these and monitor. - Monitor blood cultures - Continue formulary dulera - Albuterol neb q3h prn - Given solumedrol x1 in ED, complete 5 day course with prednisone 23m daily x 4 days  HTN: Chronic, stable.  - Continue cozaar (Cr wnl) - Take HCTZ as needed due to history of hypotension. Will hold this.   DVT prophylaxis: Lovenox  Code Status: Full  Family Communication: None at bedside, son updated by phone Disposition Plan: Home once clinically improved Consults called: None  Admission status: Observation    RVance Gather MD [facility] Pager [phone] If 7PM-7AM, please contact night-coverage [email or web address] Password [email or web address]1/29/2018, 4:42 PM

## 2016-11-24 NOTE — ED Notes (Signed)
Set of blood cultures collectd by this RN

## 2016-11-24 NOTE — Progress Notes (Signed)
Pharmacy Antibiotic Note  Kimie Hammer is a 81 y.o. female admitted on 11/24/2016 with pneumonia.  Pharmacy has been consulted for ceftriaxone and azithromycin dosing.  Plan:  Ceftriaxone 1gm IV q24h  Azithromycin 500mg  IV [id number]  First doses order in ED  Neither antibiotic requires dose adjustment for renal function, pharmacy will sign-off   Temp (24hrs), Avg:101.1 F (38.4 C), Min:101.1 F (38.4 C), Max:101.1 F (38.4 C)   Recent Labs Lab 11/24/16 1025  WBC 7.2    CrCl cannot be calculated (Patient's most recent lab result is older than the maximum 21 days allowed.).    Allergies  Allergen Reactions  . Statins Other (See Comments)    Increased LFT's  . Spiriva [Tiotropium Bromide Monohydrate] Itching    Antimicrobials this admission: 1/29 >> ceftriaxone 1/29 >> azithromycin    Microbiology results: 1/29 BCx:  1/29 UCx  Thank you for allowing pharmacy to be a part of this patient's care.  Doreene Eland, PharmD, BCPS.   Pager: [phone] 11/24/2016 11:10 AM

## 2016-11-25 ENCOUNTER — Encounter: Payer: Self-pay | Admitting: Internal Medicine

## 2016-11-25 DIAGNOSIS — J441 Chronic obstructive pulmonary disease with (acute) exacerbation: Secondary | ICD-10-CM | POA: Diagnosis not present

## 2016-11-25 LAB — RESPIRATORY PANEL BY PCR
Adenovirus: NOT DETECTED
Bordetella pertussis: NOT DETECTED
CORONAVIRUS 229E-RVPPCR: NOT DETECTED
CORONAVIRUS HKU1-RVPPCR: NOT DETECTED
CORONAVIRUS OC43-RVPPCR: NOT DETECTED
Chlamydophila pneumoniae: NOT DETECTED
Coronavirus NL63: NOT DETECTED
Influenza A: NOT DETECTED
Influenza B: NOT DETECTED
METAPNEUMOVIRUS-RVPPCR: DETECTED — AB
Mycoplasma pneumoniae: NOT DETECTED
PARAINFLUENZA VIRUS 1-RVPPCR: NOT DETECTED
PARAINFLUENZA VIRUS 2-RVPPCR: NOT DETECTED
Parainfluenza Virus 3: NOT DETECTED
Parainfluenza Virus 4: NOT DETECTED
Respiratory Syncytial Virus: NOT DETECTED
Rhinovirus / Enterovirus: NOT DETECTED

## 2016-11-25 LAB — URINE CULTURE: CULTURE: NO GROWTH

## 2016-11-25 MED ORDER — SULFAMETHOXAZOLE-TRIMETHOPRIM 400-80 MG PO TABS: 1.0000 | ORAL_TABLET | Freq: Two times a day (BID) | ORAL | 0 refills | Status: DC

## 2016-11-25 MED ORDER — IPRATROPIUM-ALBUTEROL 0.5-2.5 (3) MG/3ML IN SOLN: 3.0000 mL | Freq: Two times a day (BID) | RESPIRATORY_TRACT | Status: DC

## 2016-11-25 MED ORDER — SULFAMETHOXAZOLE-TRIMETHOPRIM 400-80 MG PO TABS
1.0000 | ORAL_TABLET | Freq: Two times a day (BID) | ORAL | Status: DC
  Administered 2016-11-25: 1 via ORAL
  Filled 2016-11-25: qty 1

## 2016-11-25 MED ORDER — ALBUTEROL SULFATE (2.5 MG/3ML) 0.083% IN NEBU
5.0000 mg | INHALATION_SOLUTION | RESPIRATORY_TRACT | Status: DC | PRN
Start: 2016-11-25 — End: 2016-11-25
  Administered 2016-11-25: 5 mg via RESPIRATORY_TRACT
  Filled 2016-11-25: qty 6

## 2016-11-25 MED ORDER — OSELTAMIVIR PHOSPHATE 75 MG PO CAPS: 75.0000 mg | ORAL_CAPSULE | Freq: Two times a day (BID) | ORAL | 0 refills | Status: DC

## 2016-11-25 MED ORDER — PREDNISONE 20 MG PO TABS: ORAL_TABLET | ORAL | 0 refills | Status: DC

## 2016-11-25 MED ORDER — OSELTAMIVIR PHOSPHATE 75 MG PO CAPS
75.0000 mg | ORAL_CAPSULE | Freq: Two times a day (BID) | ORAL | Status: DC
  Administered 2016-11-25: 75 mg via ORAL
  Filled 2016-11-25: qty 1

## 2016-11-25 NOTE — Discharge Summary (Signed)
Physician Discharge Summary  Adalena Piatt ZOX:[id number] DOB: [birth year] DOA: 11/24/2016  PCP: Cammy Copa, MD  Admit date: 11/24/2016 Discharge date: 11/25/2016  Admitted From: Home  Disposition: Home   Recommendations for Outpatient Follow-up:  1. Follow up with PCP in 1-2 weeks 2. Please obtain BMP/CBC in one week 3. Please follow final results of blood culture and respiratory panel.  4. Please repeat LFT     Discharge Condition: stable.  CODE STATUS: Full code.  Diet recommendation: Heart Healthy  Brief/Interim Summary: Kelly Delgado is a 81 y.o. female with with a history of COPD who presented to the ED for cough, fever and weakness.  She began feeling ill 4 days ago with nonproductive cough and intermittent wheezing that worsened over the next few days. She began feeling more weak 2 days ago and noticed fever that has been intermittent since then as well. This morning she began feeling short of breath with any exertion at all, prompting evaluation in the ED.   She was dyspneic on arrival with oxygen saturations 88% on room air at rest improved to 94% with 4L by nasal cannula. She was wheezing, improved with nebulizer treatments, and was given solumedrol. She was given empiric antibiotics for pneumonia though CXR showed bronchitic changes and no infiltrate. She continued to feel weak and [facility] called to admit for COPD exacerbation.    Discharge Diagnoses:  Active Problems:   COPD exacerbation ([place])  1-Acute COPD exacerbation;  Patient has improved this morning. She report feeling breathing at baseline.  Patient received IV solumedrol in the ED, nebulizer treatment.  Patient to be discharge on prednisone for 5 days, nebulizer, tamiflu and Bactrim.  Respiratory panel pending. Blood culture pending.   2-Acute hypoxic Respiratory Failure; in event of COPD exacerbation.  Patient hypoxemia has resolved. Oxygen on RA was 94 % .  Continue with treatment of COPD flare.    3-HNT; continue with home medications.   Discharge Instructions  Discharge Instructions    Diet - low sodium heart healthy    Complete by:  As directed    Increase activity slowly    Complete by:  As directed      Allergies as of 11/25/2016      Reactions   Statins Other (See Comments)   Increased LFT's   Spiriva [tiotropium Bromide Monohydrate] Itching      Medication List    STOP taking these medications   cephALEXin 500 MG capsule Commonly known as:  KEFLEX     TAKE these medications   albuterol (2.5 MG/3ML) 0.083% nebulizer solution Commonly known as:  PROVENTIL Take 3 mL (2.5 mg total) by nebulization every 6 (six) hours as needed for wheezing or shortness of breath (Dispense nebulizer kit with medication).   PROAIR HFA 108 (90 Base) MCG/ACT inhaler Generic drug:  albuterol Inhale 2 puffs into the lungs every 6 (six) hours as needed for wheezing.   aspirin EC 81 MG tablet Take 81 mg by mouth daily.   budesonide-formoterol 80-4.5 MCG/ACT inhaler Commonly known as:  SYMBICORT Inhale 2 puffs into the lungs 2 (two) times daily.   CALCIUM 600 + D PO Take 1 tablet by mouth every other day.   hydrochlorothiazide 25 MG tablet Commonly known as:  HYDRODIURIL Take 25 mg by mouth daily as needed (high blood pressure).   losartan 50 MG tablet Commonly known as:  COZAAR Take 1 tablet (50 mg total) by mouth daily.   oseltamivir 75 MG capsule Commonly known as:  TAMIFLU Take  1 capsule (75 mg total) by mouth 2 (two) times daily.   predniSONE 20 MG tablet Commonly known as:  DELTASONE Take 3 tablets by mouth for 5 days. What changed:  medication strength  additional instructions   sulfamethoxazole-trimethoprim 400-80 MG tablet Commonly known as:  BACTRIM,SEPTRA Take 1 tablet by mouth every 12 (twelve) hours.   Vitamin D-3 1000 units Caps Take 2,000 Units by mouth daily.       Allergies  Allergen Reactions  . Statins Other (See Comments)     Increased LFT's  . Spiriva [Tiotropium Bromide Monohydrate] Itching    Consultations: none  Procedures/Studies: Dg Chest 2 View  Result Date: 11/24/2016 CLINICAL DATA:  Increased cough, congestion, shortness of breath, generalized weakness and fever over last 2 days, history hypertension, GERD, asthmatic bronchitis EXAM: CHEST  2 VIEW COMPARISON:  06/09/2013 FINDINGS: Normal heart size, mediastinal contours, and pulmonary vascularity. Chronic bronchitic changes. Chronic accentuation of basilar markings appears unchanged. No definite acute infiltrate, pleural effusion or pneumothorax. Bones demineralized. IMPRESSION: Chronic bronchitic changes and accentuation of basilar markings without definite acute infiltrate. Electronically Signed   By: Lavonia Dana M.D.   On: 11/24/2016 11:42       Subjective: Feeling better, breathing at baseline.   Discharge Exam: Vitals:   11/24/16 2011 11/25/16 0515  BP: (!) 166/81 (!) 147/90  Pulse: 76 74  Resp: 20 18  Temp: 98.3 F (36.8 C) 98.7 F (37.1 C)   Vitals:   11/24/16 1400 11/24/16 1446 11/24/16 2011 11/25/16 0515  BP: 156/81 (!) 165/81 (!) 166/81 (!) 147/90  Pulse: 83 80 76 74  Resp: '18 20 20 18  ' Temp:  98.1 F (36.7 C) 98.3 F (36.8 C) 98.7 F (37.1 C)  TempSrc:  Oral Oral Oral  SpO2: 93% 94% 94% 97%  Weight:      Height:        General: Pt is alert, awake, not in acute distress Cardiovascular: RRR, S1/S2 +, no rubs, no gallops Respiratory: CTA bilaterally, no wheezing, no rhonchi Abdominal: Soft, NT, ND, bowel sounds + Extremities: no edema, no cyanosis    The results of significant diagnostics from this hospitalization (including imaging, microbiology, ancillary and laboratory) are listed below for reference.     Microbiology: No results found for this or any previous visit (from the past 240 hour(s)).   Labs: BNP (last 3 results) No results for input(s): BNP in the last 8760 hours. Basic Metabolic Panel:  Recent  Labs Lab 11/24/16 1025 11/24/16 1637  NA 134*  --   K 3.8  --   CL 100*  --   CO2 22  --   GLUCOSE 135*  --   BUN 17  --   CREATININE 0.84 0.67  CALCIUM 8.8*  --    Liver Function Tests:  Recent Labs Lab 11/24/16 1025  AST 35  ALT 31  ALKPHOS 205*  BILITOT 0.7  PROT 7.5  ALBUMIN 3.6   No results for input(s): LIPASE, AMYLASE in the last 168 hours. No results for input(s): AMMONIA in the last 168 hours. CBC:  Recent Labs Lab 11/24/16 1025 11/24/16 1637  WBC 7.2 4.1  NEUTROABS 5.8  --   HGB 14.5 13.1  HCT 42.0 39.9  MCV 85.5 86.4  PLT 140* 130*   Cardiac Enzymes: No results for input(s): CKTOTAL, CKMB, CKMBINDEX, TROPONINI in the last 168 hours. BNP: Invalid input(s): POCBNP CBG: No results for input(s): GLUCAP in the last 168 hours. D-Dimer No results for input(s):  DDIMER in the last 72 hours. Hgb A1c No results for input(s): HGBA1C in the last 72 hours. Lipid Profile No results for input(s): CHOL, HDL, LDLCALC, TRIG, CHOLHDL, LDLDIRECT in the last 72 hours. Thyroid function studies No results for input(s): TSH, T4TOTAL, T3FREE, THYROIDAB in the last 72 hours.  Invalid input(s): FREET3 Anemia work up No results for input(s): VITAMINB12, FOLATE, FERRITIN, TIBC, IRON, RETICCTPCT in the last 72 hours. Urinalysis    Component Value Date/Time   COLORURINE YELLOW 11/24/2016 1243   APPEARANCEUR CLEAR 11/24/2016 1243   LABSPEC 1.006 11/24/2016 1243   PHURINE 6.0 11/24/2016 1243   GLUCOSEU NEGATIVE 11/24/2016 1243   HGBUR SMALL (A) 11/24/2016 1243   BILIRUBINUR NEGATIVE 11/24/2016 1243   KETONESUR NEGATIVE 11/24/2016 1243   PROTEINUR NEGATIVE 11/24/2016 1243   UROBILINOGEN 0.2 10/08/2014 2129   NITRITE NEGATIVE 11/24/2016 1243   LEUKOCYTESUR NEGATIVE 11/24/2016 1243   Sepsis Labs Invalid input(s): PROCALCITONIN,  WBC,  LACTICIDVEN Microbiology No results found for this or any previous visit (from the past 240 hour(s)).   Time coordinating  discharge: Over 30 minutes  SIGNED:   Elmarie Shiley, MD  [facility] 11/25/2016, 10:35 AM Pager [phone]  If 7PM-7AM, please contact night-coverage [email or web address] Password [email or web address]

## 2016-11-26 ENCOUNTER — Other Ambulatory Visit: Payer: Self-pay

## 2016-11-26 MED ORDER — PREDNISONE 5 MG PO TABS: 5.0000 mg | ORAL_TABLET | Freq: Every day | ORAL | 0 refills | Status: DC

## 2016-11-26 NOTE — Telephone Encounter (Signed)
MR- please see pt email and advise recs, thanks!  I am taking three 20 mg tablets a day for 5 days and then nothing.I have taken prednisone before but have always been tapered off.I have no problem taking them but wanted to be sure about tapering off. ----- Message ----- From: CMA Doneta Public Sent: 1/31/20188:38 AM EST To: Shamon Fonseca Subject: RE: Visit Follow-Up Question Hi Rania,  To answer your question, it depends on the dose and what you are taking it for. I see that they recently prescribed this for you at the hospital. If you are having trouble taking it the way they prescribed let us know. Have a great day!  ----- Message -----  From: Parissa Hollenberg  Sent: 1/30/20186:47 PM EST  To: Brand Males, MD Subject: Visit Follow-Up Question  Is predinisone ever terminated without tapering off?

## 2016-11-26 NOTE — Telephone Encounter (Signed)
I have spoke with pt via telephone and made her aware of MR's recommendation. Pt states she has already taken 3 tablets today, therefore she will start her taper tomorrow. Pt states she twelve 20mg  prednisone tablets left, which is enough for the taper, however we will have to send in a 5mg  tablet.  Rx has been sent to preferred pharmacy. Nothing further needed.

## 2016-11-26 NOTE — Telephone Encounter (Signed)
Short course like 5 or 7 days without taper is fine but I agree the standard convention is to taper. My bigger concern is that the starting dose is low. I actually would recommend she do Please take prednisone 40 mg x1 day, then 30 mg x1 day, then 20 mg x1 day, then 10 mg x1 day, and then 5 mg x1 day and stop - starting today esp if not improving fast enoough  Thanks Dr. Brand Males, M.D., [facility].C.P Pulmonary and Critical Care Medicine Staff Physician [facility] Pager: [phone], If no answer or between  15:00h - 7:00h: call [phone]  11/26/2016 2:12 PM

## 2016-11-29 LAB — CULTURE, BLOOD (ROUTINE X 2)
CULTURE: NO GROWTH
Culture: NO GROWTH

## 2017-02-09 ENCOUNTER — Encounter: Payer: Self-pay | Admitting: Internal Medicine

## 2017-02-09 ENCOUNTER — Ambulatory Visit (INDEPENDENT_AMBULATORY_CARE_PROVIDER_SITE_OTHER): Payer: Medicare Other | Admitting: Internal Medicine

## 2017-02-09 DIAGNOSIS — R0982 Postnasal drip: Secondary | ICD-10-CM | POA: Insufficient documentation

## 2017-02-09 DIAGNOSIS — J449 Chronic obstructive pulmonary disease, unspecified: Secondary | ICD-10-CM | POA: Diagnosis not present

## 2017-02-09 NOTE — Patient Instructions (Signed)
Stage 2 moderate COPD by GOLD classification ([place]) with intolerace (itch) to spiriva Stable disease  Plan conitnuje low dose symbicort but if this is not working well for you over next 6 months we can go back to higher dose  ffollowup Oct 2018 or sooner if needed  Post-nasal drip This has recurred due to spring season  Plan  take generic fluticasone inhaler 2 squirts each nostril daily   Fu Oct 2018 or sooner

## 2017-02-09 NOTE — Assessment & Plan Note (Signed)
This has recurred due to spring season  Plan  take generic fluticasone inhaler 2 squirts each nostril daily

## 2017-02-09 NOTE — Assessment & Plan Note (Signed)
Stable disease  Plan conitnuje low dose symbicort but if this is not working well for you over next 6 months we can go back to higher dose  ffollowup Oct 2018 or sooner if needed

## 2017-02-09 NOTE — Progress Notes (Signed)
Subjective:     Patient ID: Kelly Delgado, female   DOB: Dec 03, 1935, 81 y.o.   MRN: [id number]  HPI   Dictation #1 [id number]  [id number]   OV 07/23/2015  Chief Complaint  Patient presents with  . Follow-up    Pt here to discuss CT results and PFT results. Pt states breathing is better since last visit. She is now walking 2 miles three times a week     Follow-up Gold stage II COPD: Currently only on Symbicort. She stopped Spiriva due to itching. She also stopped [place] in past due to cost issues. With Symbicort she is doing really well. She is walking 2 miles a week. Her effort tolerance is improved. She is hardly ever symptomatic. FEV1 today 1.85 L/76% and a ratio of 74 showing very mild obstruction. There are no new issues. She will have a flu shot today. We discussed switching to a long-acting anticholinergic associated with long-acting beta agonist and skipping inhaled steroidal given recent history of hip fracture but she's not interested to change due to cost issues and because she's currently doing well  Multiple lung nodules: She had CT scan of the chest 08/03/2015: She has multiple lung nodules largest 4 mm. This is all stable since October 2014 completing near 2 year stability. She is currently 81 years of age and therefore will not qualify for future low-dose lung cancer screening CT   OV 04/30/2016  Chief Complaint  Patient presents with  . Follow-up    breathing has been doing well.  no concerns.CAT Score: 11    Follow-up moderate COPD he is a 9 month routine follow-up. She is doing well. No urgent care visits. No emergency room visits. No prednisone use. No new medical problems diagnosis. [place] and now she has a scratchy throat but several weeks ago she had a cold and did not flareup in the COPD exacerbation. She wants to keep an eye on her cold. She's not interested in inhaler change. She likes her Symbicort. She has enough refills and  will call us if needed. She's got good quality of life.   OV 02/09/2017  Chief Complaint  Patient presents with  . Follow-up    Pt states she feels the Symbicort 160 helps better than the 80, she is currently on the 80 dose. Pt c/o throat tickle, hoarseness, and SOB with over activity.  Pt denies chest congestion, CP/tightness.     Follow-up moderate COPD  Since last visit I dropped her Symbicort to the lower dose. She's had 2 exacerbations since then. She also believes that the lower dose Symbicort is not controlling her COPD as well but she's not fully sure if it is a steroid effect on just an individual variation depending on the season. In December she does not want to go up on the steroid dose to a full dose Symbicort. In the past. Event caused itching so this time she wants to hold off on any anticholinergic but if she continues to feel that lower dose Symbicort is not controlling a COPD well then she'll be open to adding a different anticholinergic other than Spiriva going up on the steroid dose. Otherwise doing well. The spring season though is causing some postnasal drainage and increased cough and ticklish throat.     has a past medical history of Asthmatic bronchitis; Basal cell carcinoma of breast (1990's?); GERD (gastroesophageal reflux disease); H/O hiatal hernia; Hypertension; Migraines; Schatzki's ring; and Shortness of breath.  reports that she quit smoking about 48 years ago. Her smoking use included Cigarettes. She has a 4.29 pack-year smoking history. She has never used smokeless tobacco.  Past Surgical History:  Procedure Laterality Date  . ABDOMINAL HYSTERECTOMY  1980's  . CATARACT EXTRACTION W/ INTRAOCULAR LENS  IMPLANT, BILATERAL Bilateral 1990's  . DILATION AND CURETTAGE OF UTERUS  1980's   "1" (06/07/2013)  . ESOPHAGEAL DILATION  2012   "just once" (06/07/2013)  . HIP PINNING,CANNULATED Right 10/09/2014   Procedure: CANNULATED HIP PINNING;  Surgeon: Renette Butters, MD;  Location: [facility];  Service: Orthopedics;  Laterality: Right;  . URETHRAL DIVERTICULUM REPAIR  1970's    Allergies  Allergen Reactions  . Statins Other (See Comments)    Increased LFT's  . Spiriva [Tiotropium Bromide Monohydrate] Itching    Immunization History  Administered Date(s) Administered  . Influenza Whole 06/27/2012  . Influenza,inj,Quad PF,36+ Mos 07/17/2014, 07/23/2015, 07/27/2016  . Pneumococcal Conjugate-13 07/17/2014  . Pneumococcal-Unspecified 06/27/2012    Family History  Problem Relation Age of Onset  . Cancer Mother   . Stroke Mother   . Hypertension Mother      Current Outpatient Prescriptions:  .  albuterol (PROAIR HFA) 108 (90 BASE) MCG/ACT inhaler, Inhale 2 puffs into the lungs every 6 (six) hours as needed for wheezing., Disp: , Rfl:  .  albuterol (PROVENTIL) (2.5 MG/3ML) 0.083% nebulizer solution, Take 3 mL (2.5 mg total) by nebulization every 6 (six) hours as needed for wheezing or shortness of breath (Dispense nebulizer kit with medication)., Disp: 75 mL, Rfl: 12 .  aspirin EC 81 MG tablet, Take 81 mg by mouth daily., Disp: , Rfl:  .  budesonide-formoterol (SYMBICORT) 80-4.5 MCG/ACT inhaler, Inhale 2 puffs into the lungs 2 (two) times daily., Disp: 1 Inhaler, Rfl: 12 .  Calcium Carb-Cholecalciferol (CALCIUM 600 + D PO), Take 1 tablet by mouth every other day. , Disp: , Rfl:  .  Cholecalciferol (VITAMIN D-3) 1000 UNITS CAPS, Take 2,000 Units by mouth daily. , Disp: , Rfl:  .  hydrochlorothiazide (HYDRODIURIL) 25 MG tablet, Take 25 mg by mouth daily as needed (high blood pressure). , Disp: , Rfl:  .  losartan (COZAAR) 50 MG tablet, Take 1 tablet (50 mg total) by mouth daily., Disp: 30 tablet, Rfl: 1    Review of Systems     Objective:   Physical Exam  Constitutional: She is oriented to person, place, and time. She appears well-developed and well-nourished. No distress.  HENT:  Head: Normocephalic and atraumatic.  Right Ear: External  ear normal.  Left Ear: External ear normal.  Mouth/Throat: Oropharynx is clear and moist. No oropharyngeal exudate.  Eyes: Conjunctivae and EOM are normal. Pupils are equal, round, and reactive to light. Right eye exhibits no discharge. Left eye exhibits no discharge. No scleral icterus.  Neck: Normal range of motion. Neck supple. No JVD present. No tracheal deviation present. No thyromegaly present.  Cardiovascular: Normal rate, regular rhythm, normal heart sounds and intact distal pulses.  Exam reveals no gallop and no friction rub.   No murmur heard. Pulmonary/Chest: Effort normal and breath sounds normal. No respiratory distress. She has no wheezes. She has no rales. She exhibits no tenderness.  Abdominal: Soft. Bowel sounds are normal. She exhibits no distension and no mass. There is no tenderness. There is no rebound and no guarding.  Musculoskeletal: Normal range of motion. She exhibits no edema or tenderness.  Lymphadenopathy:    She has no cervical adenopathy.  Neurological:  She is alert and oriented to person, place, and time. She has normal reflexes. No cranial nerve deficit. She exhibits normal muscle tone. Coordination normal.  Skin: Skin is warm and dry. No rash noted. She is not diaphoretic. No erythema. No pallor.  Psychiatric: She has a normal mood and affect. Her behavior is normal. Judgment and thought content normal.  Vitals reviewed.   Vitals:   02/09/17 1032  BP: (!) 146/98  Pulse: 77  SpO2: 97%  Weight: 181 lb 3.2 oz (82.2 kg)  Height: '5\' 8"'$  (1.727 m)    Estimated body mass index is 27.55 kg/m as calculated from the following:   Height as of this encounter: '5\' 8"'$  (1.727 m).   Weight as of this encounter: 181 lb 3.2 oz (82.2 kg).      Assessment:       ICD-9-CM ICD-10-CM   1. Stage 2 moderate COPD by GOLD classification ([place]) with intolerace (itch) to spiriva 496 J44.9   2. Post-nasal drip 784.91 R09.82        Plan:     Stage 2 moderate COPD by GOLD  classification ([facility]) with intolerace (itch) to spiriva Stable disease  Plan conitnuje low dose symbicort but if this is not working well for you over next 6 months we can go back to higher dose  ffollowup Oct 2018 or sooner if needed  Post-nasal drip This has recurred due to spring season  Plan  take generic fluticasone inhaler 2 squirts each nostril daily    rov Oct 2018 or sooner if needed  Dr. Brand Males, M.D., [facility].C.P Pulmonary and Critical Care Medicine Staff Physician [place] [facility] Pager: [phone], If no answer or between  15:00h - 7:00h: call [phone]  02/09/2017 10:59 AM

## 2017-07-17 ENCOUNTER — Emergency Department (HOSPITAL_COMMUNITY): Payer: Medicare Other

## 2017-07-17 ENCOUNTER — Encounter (HOSPITAL_COMMUNITY): Payer: Self-pay | Admitting: Emergency Medicine

## 2017-07-17 DIAGNOSIS — Z87891 Personal history of nicotine dependence: Secondary | ICD-10-CM | POA: Insufficient documentation

## 2017-07-17 DIAGNOSIS — Z79899 Other long term (current) drug therapy: Secondary | ICD-10-CM | POA: Insufficient documentation

## 2017-07-17 DIAGNOSIS — M25512 Pain in left shoulder: Secondary | ICD-10-CM | POA: Diagnosis present

## 2017-07-17 DIAGNOSIS — J45909 Unspecified asthma, uncomplicated: Secondary | ICD-10-CM | POA: Insufficient documentation

## 2017-07-17 DIAGNOSIS — I10 Essential (primary) hypertension: Secondary | ICD-10-CM | POA: Insufficient documentation

## 2017-07-17 DIAGNOSIS — M62838 Other muscle spasm: Secondary | ICD-10-CM | POA: Diagnosis not present

## 2017-07-17 LAB — BASIC METABOLIC PANEL
ANION GAP: 9 (ref 5–15)
BUN: 20 mg/dL (ref 6–20)
CALCIUM: 9.3 mg/dL (ref 8.9–10.3)
CO2: 24 mmol/L (ref 22–32)
Chloride: 104 mmol/L (ref 101–111)
Creatinine, Ser: 0.92 mg/dL (ref 0.44–1.00)
GFR, EST NON AFRICAN AMERICAN: 58 mL/min — AB (ref >60)
Glucose, Bld: 101 mg/dL — ABNORMAL HIGH (ref 65–99)
Potassium: 4.1 mmol/L (ref 3.5–5.1)
Sodium: 137 mmol/L (ref 135–145)

## 2017-07-17 LAB — CBC
HCT: 42.9 % (ref 36.0–46.0)
HEMOGLOBIN: 14.4 g/dL (ref 12.0–15.0)
MCH: 29.4 pg (ref 26.0–34.0)
MCHC: 33.6 g/dL (ref 30.0–36.0)
MCV: 87.6 fL (ref 78.0–100.0)
Platelets: 197 10*3/uL (ref 150–400)
RBC: 4.9 MIL/uL (ref 3.87–5.11)
RDW: 13.7 % (ref 11.5–15.5)
WBC: 9 10*3/uL (ref 4.0–10.5)

## 2017-07-17 LAB — I-STAT TROPONIN, ED: TROPONIN I, POC: 0 ng/mL (ref 0.00–0.08)

## 2017-07-17 NOTE — ED Triage Notes (Signed)
C/o constant sharp pain to L shoulder blade that radiates down L arm since yesterday.  Seen at PCP office today and given Mobic for "pinched nerve" that has not helped pain.Took ibuprofen yesterday without any improvement.

## 2017-07-18 ENCOUNTER — Emergency Department (HOSPITAL_COMMUNITY)
Admission: EM | Admit: 2017-07-18 | Discharge: 2017-07-18 | Disposition: A | Discharge status: Home / Self Care | Payer: Medicare Other | Attending: Emergency Medicine | Admitting: Emergency Medicine

## 2017-07-18 DIAGNOSIS — M62838 Other muscle spasm: Secondary | ICD-10-CM

## 2017-07-18 MED ORDER — METHOCARBAMOL 500 MG PO TABS
500.0000 mg | ORAL_TABLET | Freq: Once | ORAL | Status: AC
  Administered 2017-07-18: 500 mg via ORAL
  Filled 2017-07-18: qty 1

## 2017-07-18 MED ORDER — OXYCODONE-ACETAMINOPHEN 5-325 MG PO TABS
1.0000 | ORAL_TABLET | Freq: Once | ORAL | Status: AC
  Administered 2017-07-18: 1 via ORAL
  Filled 2017-07-18: qty 1

## 2017-07-18 MED ORDER — OXYCODONE-ACETAMINOPHEN 5-325 MG PO TABS
ORAL_TABLET | ORAL | Status: DC
Start: 2017-07-18 — End: 2017-07-18
  Filled 2017-07-18: qty 1

## 2017-07-18 MED ORDER — OXYCODONE-ACETAMINOPHEN 5-325 MG PO TABS
1.0000 | ORAL_TABLET | ORAL | Status: DC | PRN
Start: 2017-07-18 — End: 2017-07-18
  Administered 2017-07-18: 1 via ORAL

## 2017-07-18 MED ORDER — OXYCODONE-ACETAMINOPHEN 5-325 MG PO TABS: 1.0000 | ORAL_TABLET | Freq: Four times a day (QID) | ORAL | 0 refills | Status: DC | PRN

## 2017-07-18 MED ORDER — METHOCARBAMOL 500 MG PO TABS: 500.0000 mg | ORAL_TABLET | Freq: Three times a day (TID) | ORAL | 0 refills | Status: DC | PRN

## 2017-07-18 NOTE — ED Provider Notes (Signed)
TIME SEEN: 3:12 AM  CHIEF COMPLAINT: left posterior shoulder pain  HPI: Pt is a 81 y.o. female with history of asthma, hypertension who presents to the emergency department with left posterior shoulder pain is worse with movement of the arm and neck. Pain present for several days. Was seen at [facility] walk-in clinic tonight and was given meloxicam. Reports she took this medication when she went home and had no relief in pain. Came to the emergency department for pain relief. No chest pain or shortness of breath. No injury that she remembers. No numbness, tingling or focal weakness. No bowel or bladder incontinence. No fever.  States pain intermittently radiates down the left arm.  ROS: See HPI Constitutional: no fever  Eyes: no drainage  ENT: no runny nose   Cardiovascular:  no chest pain  Resp: no SOB  GI: no vomiting GU: no dysuria Integumentary: no rash  Allergy: no hives  Musculoskeletal: no leg swelling  Neurological: no slurred speech ROS otherwise negative  PAST MEDICAL HISTORY/PAST SURGICAL HISTORY:  Past Medical History:  Diagnosis Date  . Asthmatic bronchitis   . Basal cell carcinoma of breast 1990's?   "left side, burned it off" (06/07/2013)  . GERD (gastroesophageal reflux disease)   . H/O hiatal hernia   . Hypertension   . Migraines    "once a year usually" (06/07/2013)  . Schatzki's ring   . Shortness of breath    "related to asthmatic bronchitis only" (06/07/2013)    MEDICATIONS:  Prior to Admission medications   Medication Sig Start Date End Date Taking? Authorizing Provider  albuterol (PROAIR HFA) 108 (90 BASE) MCG/ACT inhaler Inhale 2 puffs into the lungs every 6 (six) hours as needed for wheezing.    [provider]  albuterol (PROVENTIL) (2.5 MG/3ML) 0.083% nebulizer solution Take 3 mL (2.5 mg total) by nebulization every 6 (six) hours as needed for wheezing or shortness of breath (Dispense nebulizer kit with medication). 06/09/13   Thurnell Lose, MD   aspirin EC 81 MG tablet Take 81 mg by mouth daily.    [provider]  budesonide-formoterol (SYMBICORT) 80-4.5 MCG/ACT inhaler Inhale 2 puffs into the lungs 2 (two) times daily. 09/26/16   Brand Males, MD  Calcium Carb-Cholecalciferol (CALCIUM 600 + D PO) Take 1 tablet by mouth every other day.     [provider]  Cholecalciferol (VITAMIN D-3) 1000 UNITS CAPS Take 2,000 Units by mouth daily.     [provider]  hydrochlorothiazide (HYDRODIURIL) 25 MG tablet Take 25 mg by mouth daily as needed (high blood pressure).     [provider]  losartan (COZAAR) 50 MG tablet Take 1 tablet (50 mg total) by mouth daily. 09/07/13   Brand Males, MD    ALLERGIES:  Allergies  Allergen Reactions  . Statins Other (See Comments)    Increased LFT's  . Spiriva [Tiotropium Bromide Monohydrate] Itching    SOCIAL HISTORY:  Social History  Substance Use Topics  . Smoking status: Former Smoker    Packs/day: 0.33    Years: 13.00    Types: Cigarettes    Quit date: 11/27/1968  . Smokeless tobacco: Never Used  . Alcohol use 0.0 oz/week    FAMILY HISTORY: Family History  Problem Relation Age of Onset  . Cancer Mother   . Stroke Mother   . Hypertension Mother     EXAM: BP (!) 177/118   Pulse 83   Resp 20   SpO2 99%  CONSTITUTIONAL: Alert and oriented  and responds appropriately to questions. Well-appearing; well-nourished HEAD: Normocephalic EYES: Conjunctivae clear, pupils appear equal, EOMI ENT: normal nose; moist mucous membranes NECK: Supple, no meningismus, no nuchal rigidity, no LAD; no midline spinal tenderness or step-off or deformity, tender over the left posterior trapezius muscle with associated spasm and this reproduces her pain  CARD: RRR; S1 and S2 appreciated; no murmurs, no clicks, no rubs, no gallops RESP: Normal chest excursion without splinting or tachypnea; breath sounds clear and equal bilaterally; no wheezes, no rhonchi, no rales,  no hypoxia or respiratory distress, speaking full sentences ABD/GI: Normal bowel sounds; non-distended; soft, non-tender, no rebound, no guarding, no peritoneal signs, no hepatosplenomegaly BACK:  The back appears normal and is non-tender to palpation, there is no CVA tenderness EXT:no bony tenderness or deformity noted to the left shoulder with full range of motion in this joint. 2+ radial pulses bilaterally. Normal ROM in all joints; non-tender to palpation; no edema; normal capillary refill; no cyanosis, no calf tenderness or swelling    SKIN: Normal color for age and race; warm; no rash NEURO: Moves all extremities equally PSYCH: The patient's mood and manner are appropriate. Grooming and personal hygiene are appropriate.  MEDICAL DECISION MAKING: Pt here with left trapezius muscle. Agree with continuing NSAIDs. Cardiac labs obtained in triage but do not feel this is her anginal equivalent. Doubt ACS.  No sign of gout, septic arthritis, shoulder dislocation. Doubt fracture. I do not feel she needs emergent imaging of her shoulder. This seems to be a muscle spasm. Given Percocet and Robaxin with good relief in pain. No focal neurologic deficits. Doubt cauda equina, spinal stenosis, epidural abscess or hematoma, discitis, transverse myelitis, MS. I do not feel she needs further emergent imaging. I feel she is safe to be discharged with short course of narcotics and muscle relaxers for pain control. Discussed return precautions. Recommended heat and massage. Recommend stretching. She is comfortable with this plan. Has a PCP for follow-up.   At this time, I do not feel there is any life-threatening condition present. I have reviewed and discussed all results (EKG, imaging, lab, urine as appropriate) and exam findings with patient/family. I have reviewed nursing notes and appropriate previous records.  I feel the patient is safe to be discharged home without further emergent workup and can continue workup  as an outpatient as needed. Discussed usual and customary return precautions. Patient/family verbalize understanding and are comfortable with this plan.  Outpatient follow-up has been provided if needed. All questions have been answered.      EKG Interpretation  Date/Time:  Friday July 17 2017 22:09:16 EDT Ventricular Rate:  68 PR Interval:  148 QRS Duration: 86 QT Interval:  404 QTC Calculation: 429 R Axis:   -50 Text Interpretation:  Sinus rhythm with Premature supraventricular complexes Left anterior fascicular block Abnormal ECG No old tracing to compare Confirmed by Phat Dalton, Cyril Mourning [phone]) on 07/18/2017 3:14:52 AM          Kasir Hallenbeck, Delice Bison, DO 07/18/17 [id number]

## 2017-07-18 NOTE — Discharge Instructions (Signed)
I recommend that you continue your meloxicam as prescribed.

## 2017-08-14 ENCOUNTER — Ambulatory Visit (INDEPENDENT_AMBULATORY_CARE_PROVIDER_SITE_OTHER): Payer: Medicare Other | Admitting: Internal Medicine

## 2017-08-14 ENCOUNTER — Encounter: Payer: Self-pay | Admitting: Internal Medicine

## 2017-08-14 VITALS — BP 118/64 | HR 64 | Ht 67.75 in | Wt 181.4 lb

## 2017-08-14 DIAGNOSIS — Z23 Encounter for immunization: Secondary | ICD-10-CM

## 2017-08-14 DIAGNOSIS — J449 Chronic obstructive pulmonary disease, unspecified: Secondary | ICD-10-CM

## 2017-08-14 NOTE — Patient Instructions (Addendum)
ICD-10-CM   1. Stage 2 moderate COPD by GOLD classification ([place]) with intolerace (itch) to spiriva J44.9     Stable disease Clear cxr 07/17/17  Plan High dose flu shot 08/14/2017 Continue symbicort 80/4.5 , 2 puff twice daily Albuterol as needed Please talk to PCP Aura Dials, MD -  and ensure you get  shingarix vaccine  Followuo 9 months or sooner if needed

## 2017-08-14 NOTE — Progress Notes (Signed)
Subjective:     Patient ID: Kelly Delgado, female   DOB: 1935-11-20, 81 y.o.   MRN: [id number]  HPI     OV 07/23/2015  Chief Complaint  Patient presents with  . Follow-up    Pt here to discuss CT results and PFT results. Pt states breathing is better since last visit. She is now walking 2 miles three times a week     Follow-up Gold stage II COPD: Currently only on Symbicort. She stopped Spiriva due to itching. She also stopped [place] in past due to cost issues. With Symbicort she is doing really well. She is walking 2 miles a week. Her effort tolerance is improved. She is hardly ever symptomatic. FEV1 today 1.85 L/76% and a ratio of 74 showing very mild obstruction. There are no new issues. She will have a flu shot today. We discussed switching to a long-acting anticholinergic associated with long-acting beta agonist and skipping inhaled steroidal given recent history of hip fracture but she's not interested to change due to cost issues and because she's currently doing well  Multiple lung nodules: She had CT scan of the chest 08/03/2015: She has multiple lung nodules largest 4 mm. This is all stable since October 2014 completing near 2 year stability. She is currently 81 years of age and therefore will not qualify for future low-dose lung cancer screening CT   OV 04/30/2016  Chief Complaint  Patient presents with  . Follow-up    breathing has been doing well.  no concerns.CAT Score: 11    Follow-up moderate COPD he is a 9 month routine follow-up. She is doing well. No urgent care visits. No emergency room visits. No prednisone use. No new medical problems diagnosis. Few days [place] the [place] and now she has a scratchy throat but several weeks ago she had a cold and did not flareup in the COPD exacerbation. She wants to keep an eye on her cold. She's not interested in inhaler change. She likes her Symbicort. She has enough refills and will call us if needed. She's got good  quality of life.   OV 02/09/2017  Chief Complaint  Patient presents with  . Follow-up    Pt states she feels the Symbicort 160 helps better than the 80, she is currently on the 80 dose. Pt c/o throat tickle, hoarseness, and SOB with over activity.  Pt denies chest congestion, CP/tightness.     Follow-up moderate COPD  Since last visit I dropped her Symbicort to the lower dose. She's had 2 exacerbations since then. She also believes that the lower dose Symbicort is not controlling her COPD as well but she's not fully sure if it is a steroid effect on just an individual variation depending on the season. In December she does not want to go up on the steroid dose to a full dose Symbicort. In the past. Event caused itching so this time she wants to hold off on any anticholinergic but if she continues to feel that lower dose Symbicort is not controlling a COPD well then she'll be open to adding a different anticholinergic other than Spiriva going up on the steroid dose. Otherwise doing well. The spring season though is causing some postnasal drainage and increased cough and ticklish throat.   OV 08/14/2017  Chief Complaint  Patient presents with  . Follow-up    Pt states that she has been doing good. Pt is currently on presnisone due to arthritis but has two days left of  that. Was a little SOB but the prednisone she is currently on is helping with that.   Follow-up moderate COPD   Last seen April 2018. Normally does 9 month follow-up. But at last visit. Drop the steroid dose and therefore she is here in 6 months. COPD stable no exacerbations. Recently shehad neck spasm and is currently finishing a prednisone taper given by orthopedics and this is actually helping his COPD symptoms. COPD cat score is 7 and only shows mild symptom burden. She wants flu shot. No new issues. Chest x-ray September 2018 per report is clear   has a past medical history of Asthmatic bronchitis; Basal cell carcinoma of  breast (1990's?); GERD (gastroesophageal reflux disease); H/O hiatal hernia; Hypertension; Migraines; Schatzki's ring; and Shortness of breath.   reports that she quit smoking about 48 years ago. Her smoking use included Cigarettes. She has a 4.29 pack-year smoking history. She has never used smokeless tobacco.  Past Surgical History:  Procedure Laterality Date  . ABDOMINAL HYSTERECTOMY  1980's  . CATARACT EXTRACTION W/ INTRAOCULAR LENS  IMPLANT, BILATERAL Bilateral 1990's  . DILATION AND CURETTAGE OF UTERUS  1980's   "1" (06/07/2013)  . ESOPHAGEAL DILATION  2012   "just once" (06/07/2013)  . HIP PINNING,CANNULATED Right 10/09/2014   Procedure: CANNULATED HIP PINNING;  Surgeon: Renette Butters, MD;  Location: [facility];  Service: Orthopedics;  Laterality: Right;  . URETHRAL DIVERTICULUM REPAIR  1970's    Allergies  Allergen Reactions  . Statins Other (See Comments)    Increased LFT's  . Spiriva [Tiotropium Bromide Monohydrate] Itching    Immunization History  Administered Date(s) Administered  . Influenza Whole 06/27/2012  . Influenza,inj,Quad PF,6+ Mos 07/17/2014, 07/23/2015, 07/27/2016  . Pneumococcal Conjugate-13 07/17/2014  . Pneumococcal-Unspecified 06/27/2012    Family History  Problem Relation Age of Onset  . Cancer Mother   . Stroke Mother   . Hypertension Mother        Current Outpatient Prescriptions:  .  albuterol (PROAIR HFA) 108 (90 BASE) MCG/ACT inhaler, Inhale 2 puffs into the lungs every 6 (six) hours as needed for wheezing., Disp: , Rfl:  .  albuterol (PROVENTIL) (2.5 MG/3ML) 0.083% nebulizer solution, Take 3 mL (2.5 mg total) by nebulization every 6 (six) hours as needed for wheezing or shortness of breath (Dispense nebulizer kit with medication)., Disp: 75 mL, Rfl: 12 .  aspirin EC 81 MG tablet, Take 81 mg by mouth daily., Disp: , Rfl:  .  budesonide-formoterol (SYMBICORT) 80-4.5 MCG/ACT inhaler, Inhale 2 puffs into the lungs 2 (two) times daily., Disp: 1  Inhaler, Rfl: 12 .  Calcium Carb-Cholecalciferol (CALCIUM 600 + D PO), Take 1 tablet by mouth every other day. , Disp: , Rfl:  .  Cholecalciferol (VITAMIN D-3) 1000 UNITS CAPS, Take 2,000 Units by mouth daily. , Disp: , Rfl:  .  methylPREDNISolone (MEDROL DOSEPAK) 4 MG TBPK tablet, FPD, Disp: , Rfl: 0 .  hydrochlorothiazide (HYDRODIURIL) 25 MG tablet, Take 25 mg by mouth daily as needed (for swelling). , Disp: , Rfl:  .  losartan (COZAAR) 50 MG tablet, Take 1 tablet (50 mg total) by mouth daily. (Patient not taking: Reported on 08/14/2017), Disp: 30 tablet, Rfl: 1    Review of Systems     Objective:   Physical Exam  Constitutional: She is oriented to person, place, and time. She appears well-developed and well-nourished. No distress.  HENT:  Head: Normocephalic and atraumatic.  Right Ear: External ear normal.  Left Ear: External ear normal.  Mouth/Throat: Oropharynx is clear and moist. No oropharyngeal exudate.  Eyes: Pupils are equal, round, and reactive to light. Conjunctivae and EOM are normal. Right eye exhibits no discharge. Left eye exhibits no discharge. No scleral icterus.  Neck: Normal range of motion. Neck supple. No JVD present. No tracheal deviation present. No thyromegaly present.  Cardiovascular: Normal rate, regular rhythm, normal heart sounds and intact distal pulses.  Exam reveals no gallop and no friction rub.   No murmur heard. Pulmonary/Chest: Effort normal and breath sounds normal. No respiratory distress. She has no wheezes. She has no rales. She exhibits no tenderness.  Abdominal: Soft. Bowel sounds are normal. She exhibits no distension and no mass. There is no tenderness. There is no rebound and no guarding.  Musculoskeletal: Normal range of motion. She exhibits no edema or tenderness.  Lymphadenopathy:    She has no cervical adenopathy.  Neurological: She is alert and oriented to person, place, and time. She has normal reflexes. No cranial nerve deficit. She  exhibits normal muscle tone. Coordination normal.  Skin: Skin is warm and dry. No rash noted. She is not diaphoretic. No erythema. No pallor.  Psychiatric: She has a normal mood and affect. Her behavior is normal. Judgment and thought content normal.  Vitals reviewed.  Vitals:   08/14/17 1012  BP: 118/64  Pulse: 64  SpO2: 98%  Weight: 181 lb 6.4 oz (82.3 kg)  Height: 5' 7.75" (1.721 m)    Estimated body mass index is 27.79 kg/m as calculated from the following:   Height as of this encounter: 5' 7.75" (1.721 m).   Weight as of this encounter: 181 lb 6.4 oz (82.3 kg).       Assessment:       ICD-10-CM   1. Stage 2 moderate COPD by GOLD classification (HCC) with intolerace (itch) to spiriva J44.9        Plan:      Stable disease Clear cxr 07/17/17  Plan High dose flu shot 08/14/2017 Continue symbicort 80/4.5 , 2 puff twice daily Albuterol as needed Please talk to PCP Aura Dials, MD -  and ensure you get  shingarix vaccine  Followuo 9 months or sooner if needed  Dr. Brand Males, M.D., [facility].C.P Pulmonary and Critical Care Medicine Staff Physician [facility] Pager: [phone], If no answer or between  15:00h - 7:00h: call [phone]  08/14/2017 10:50 AM

## 2017-10-17 ENCOUNTER — Other Ambulatory Visit: Payer: Self-pay | Admitting: Internal Medicine

## 2018-02-05 ENCOUNTER — Telehealth: Payer: Self-pay | Admitting: Internal Medicine

## 2018-02-05 MED ORDER — ALBUTEROL SULFATE HFA 108 (90 BASE) MCG/ACT IN AERS: 2.0000 | INHALATION_SPRAY | Freq: Four times a day (QID) | RESPIRATORY_TRACT | 3 refills | Status: DC | PRN

## 2018-02-05 NOTE — Telephone Encounter (Signed)
Spoke with pt and advised rx sent to pharmacy. Nothing further is needed.   

## 2018-05-18 ENCOUNTER — Ambulatory Visit: Payer: Medicare Other | Admitting: Internal Medicine

## 2018-05-18 ENCOUNTER — Encounter: Payer: Self-pay | Admitting: Internal Medicine

## 2018-05-18 VITALS — BP 134/70 | HR 77 | Ht 67.75 in | Wt 186.6 lb

## 2018-05-18 DIAGNOSIS — J449 Chronic obstructive pulmonary disease, unspecified: Secondary | ICD-10-CM | POA: Diagnosis not present

## 2018-05-18 DIAGNOSIS — R0982 Postnasal drip: Secondary | ICD-10-CM

## 2018-05-18 MED ORDER — BUDESONIDE-FORMOTEROL FUMARATE 80-4.5 MCG/ACT IN AERO: INHALATION_SPRAY | RESPIRATORY_TRACT | 11 refills | Status: DC

## 2018-05-18 NOTE — Patient Instructions (Addendum)
ICD-10-CM   1. Stage 2 moderate COPD by GOLD classification ([place]) with intolerace (itch) to spiriva J44.9   2. Post-nasal drip R09.82     Glad stable Some increased post nasal drip due to weather  Plan Continue symbicort 2 puff twice daily Use nasal steroid as needed Use albuterol as needed Continue [employer] exercises FLu shot in fall Please talk to PCP Marda Stalker, PA-C -  and ensure you get  shingrix vaccine  Followup 1 year or sooner; CAT Score at followup

## 2018-05-18 NOTE — Progress Notes (Signed)
Subjective:     Patient ID: Kelly Delgado, female   DOB: 1936/06/03, 82 y.o.   MRN: [id number]  HPI    OV 07/23/2015  Chief Complaint  Patient presents with  . Follow-up    Pt here to discuss CT results and PFT results. Pt states breathing is better since last visit. She is now walking 2 miles three times a week     Follow-up Gold stage II COPD: Currently only on Symbicort. She stopped Spiriva due to itching. She also stopped [place] in past due to cost issues. With Symbicort she is doing really well. She is walking 2 miles a week. Her effort tolerance is improved. She is hardly ever symptomatic. FEV1 today 1.85 L/76% and a ratio of 74 showing very mild obstruction. There are no new issues. She will have a flu shot today. We discussed switching to a long-acting anticholinergic associated with long-acting beta agonist and skipping inhaled steroidal given recent history of hip fracture but she's not interested to change due to cost issues and because she's currently doing well  Multiple lung nodules: She had CT scan of the chest 08/03/2015: She has multiple lung nodules largest 4 mm. This is all stable since October 2014 completing near 2 year stability. She is currently 82 years of age and therefore will not qualify for future low-dose lung cancer screening CT   OV 04/30/2016  Chief Complaint  Patient presents with  . Follow-up    breathing has been doing well.  no concerns.CAT Score: 11    Follow-up moderate COPD he is a 9 month routine follow-up. She is doing well. No urgent care visits. No emergency room visits. No prednisone use. No new medical problems diagnosis. Few days ago she went to the mountains and now she has a scratchy throat but several weeks ago she had a cold and did not flareup in the COPD exacerbation. She wants to keep an eye on her cold. She's not interested in inhaler change. She likes her Symbicort. She has enough refills and will call us if needed. She's got good  quality of life.   OV 02/09/2017  Chief Complaint  Patient presents with  . Follow-up    Pt states she feels the Symbicort 160 helps better than the 80, she is currently on the 80 dose. Pt c/o throat tickle, hoarseness, and SOB with over activity.  Pt denies chest congestion, CP/tightness.     Follow-up moderate COPD  Since last visit I dropped her Symbicort to the lower dose. She's had 2 exacerbations since then. She also believes that the lower dose Symbicort is not controlling her COPD as well but she's not fully sure if it is a steroid effect on just an individual variation depending on the season. In December she does not want to go up on the steroid dose to a full dose Symbicort. In the past. Event caused itching so this time she wants to hold off on any anticholinergic but if she continues to feel that lower dose Symbicort is not controlling a COPD well then she'll be open to adding a different anticholinergic other than Spiriva going up on the steroid dose. Otherwise doing well. The spring season though is causing some postnasal drainage and increased cough and ticklish throat.   OV 08/14/2017  Chief Complaint  Patient presents with  . Follow-up    Pt states that she has been doing good. Pt is currently on presnisone due to arthritis but has two days left of that.  Was a little SOB but the prednisone she is currently on is helping with that.   Follow-up moderate COPD   Last seen April 2018. Normally does 9 month follow-up. But at last visit. Drop the steroid dose and therefore she is here in 6 months. COPD stable no exacerbations. Recently shehad neck spasm and is currently finishing a prednisone taper given by orthopedics and this is actually helping his COPD symptoms. COPD cat score is 7 and only shows mild symptom burden. She wants flu shot. No new issues. Chest x-ray September 2018 per report is clear   OV 05/18/2018  Chief Complaint  Patient presents with  . Follow-up     Pt states breathing has been worse with the humidity and hot weather. States she has been having to use rescue inhaler at least twice a day. Pt also states she has been a little more SOB recently, has an occ. cough with clear mucus, but denies any CP.    Follow-up moderate COPD  This is a routine follow-up. She normally does 9 month follow-ups. Last visit October 2018 periods in the interim she's continued to do well. COPD cat score is 6 and slightly better/same compared to the before. She does daily exercises at [facility] which helps. Recently with humidity she is dealing with some increased postnasal drip for which she takes nasal steroids as needed. In the interim has been no medical issues are ER visits or urgent care visits or prednisone use or new medical diagnoses. Last chest x-ray September 2018 clear    CAT COPD Symptom & Quality of Life Score ([employer] trademark) 0 is no burden. 5 is highest burden 05/18/2018   Never Cough -> Cough all the time 2  No phlegm in chest -> Chest is full of phlegm 0  No chest tightness -> Chest feels very tight 0  No dyspnea for 1 flight stairs/hill -> Very dyspneic for 1 flight of stairs 3  No limitations for ADL at home -> Very limited with ADL at home 0  Confident leaving home -> Not at all confident leaving home 0  Sleep soundly -> Do not sleep soundly because of lung condition 0  Lots of Energy -> No energy at all 1  TOTAL Score (max 40)  6        has a past medical history of Asthmatic bronchitis, Basal cell carcinoma of breast (1990's?), GERD (gastroesophageal reflux disease), H/O hiatal hernia, Hypertension, Migraines, Schatzki's ring, and Shortness of breath.   reports that she quit smoking about 49 years ago. Her smoking use included cigarettes. She has a 4.29 pack-year smoking history. She has never used smokeless tobacco.  Past Surgical History:  Procedure Laterality Date  . ABDOMINAL HYSTERECTOMY  1980's  . CATARACT EXTRACTION W/ INTRAOCULAR  LENS  IMPLANT, BILATERAL Bilateral 1990's  . DILATION AND CURETTAGE OF UTERUS  1980's   "1" (06/07/2013)  . ESOPHAGEAL DILATION  2012   "just once" (06/07/2013)  . HIP PINNING,CANNULATED Right 10/09/2014   Procedure: CANNULATED HIP PINNING;  Surgeon: Renette Butters, MD;  Location: [facility];  Service: Orthopedics;  Laterality: Right;  . URETHRAL DIVERTICULUM REPAIR  1970's    Allergies  Allergen Reactions  . Statins Other (See Comments)    Increased LFT's  . Spiriva [Tiotropium Bromide Monohydrate] Itching    Immunization History  Administered Date(s) Administered  . Influenza Whole 06/27/2012  . Influenza, High Dose Seasonal PF 08/14/2017  . Influenza,inj,Quad PF,6+ Mos 07/17/2014, 07/23/2015, 07/27/2016  . Pneumococcal Conjugate-13  07/17/2014  . Pneumococcal-Unspecified 06/27/2012    Family History  Problem Relation Age of Onset  . Cancer Mother   . Stroke Mother   . Hypertension Mother      Current Outpatient Medications:  .  albuterol (PROAIR HFA) 108 (90 Base) MCG/ACT inhaler, Inhale 2 puffs into the lungs every 6 (six) hours as needed for wheezing., Disp: 1 Inhaler, Rfl: 3 .  albuterol (PROVENTIL) (2.5 MG/3ML) 0.083% nebulizer solution, Take 3 mL (2.5 mg total) by nebulization every 6 (six) hours as needed for wheezing or shortness of breath (Dispense nebulizer kit with medication)., Disp: 75 mL, Rfl: 12 .  aspirin EC 81 MG tablet, Take 81 mg by mouth daily., Disp: , Rfl:  .  Calcium Carb-Cholecalciferol (CALCIUM 600 + D PO), Take 1 tablet by mouth every other day. , Disp: , Rfl:  .  Cholecalciferol (VITAMIN D-3 PO), Take 2,000 Units by mouth daily. , Disp: , Rfl:  .  hydrochlorothiazide (HYDRODIURIL) 25 MG tablet, Take 25 mg by mouth daily as needed (for swelling). , Disp: , Rfl:  .  SYMBICORT 80-4.5 MCG/ACT inhaler, INHALE 2 PUFFS INTO THE LUNGS 2 TIMES DAILY, Disp: 10.2 g, Rfl: 6 .  losartan (COZAAR) 50 MG tablet, Take 1 tablet (50 mg total) by mouth daily. (Patient not  taking: Reported on 08/14/2017), Disp: 30 tablet, Rfl: 1   Review of Systems     Objective:   Physical Exam  Constitutional: She is oriented to person, place, and time. She appears well-developed and well-nourished. No distress.  HENT:  Head: Normocephalic and atraumatic.  Right Ear: External ear normal.  Left Ear: External ear normal.  Mouth/Throat: Oropharynx is clear and moist. No oropharyngeal exudate.  Eyes: Pupils are equal, round, and reactive to light. Conjunctivae and EOM are normal. Right eye exhibits no discharge. Left eye exhibits no discharge. No scleral icterus.  Neck: Normal range of motion. Neck supple. No JVD present. No tracheal deviation present. No thyromegaly present.  Cardiovascular: Normal rate, regular rhythm, normal heart sounds and intact distal pulses. Exam reveals no gallop and no friction rub.  No murmur heard. Pulmonary/Chest: Effort normal and breath sounds normal. No respiratory distress. She has no wheezes. She has no rales. She exhibits no tenderness.  Abdominal: Soft. Bowel sounds are normal. She exhibits no distension and no mass. There is no tenderness. There is no rebound and no guarding.  Musculoskeletal: Normal range of motion. She exhibits no edema or tenderness.  Lymphadenopathy:    She has no cervical adenopathy.  Neurological: She is alert and oriented to person, place, and time. She has normal reflexes. No cranial nerve deficit. She exhibits normal muscle tone. Coordination normal.  Skin: Skin is warm and dry. No rash noted. She is not diaphoretic. No erythema. No pallor.  Psychiatric: She has a normal mood and affect. Her behavior is normal. Judgment and thought content normal.  Vitals reviewed.  Today's Vitals   05/18/18 0932  BP: 134/70  Pulse: 77  SpO2: 95%  Weight: 186 lb 9.6 oz (84.6 kg)  Height: 5' 7.75" (1.721 m)    Estimated body mass index is 28.58 kg/m as calculated from the following:   Height as of this encounter: 5'  7.75" (1.721 m).   Weight as of this encounter: 186 lb 9.6 oz (84.6 kg).     Assessment:       ICD-10-CM   1. Stage 2 moderate COPD by GOLD classification (HCC) with intolerace (itch) to spiriva J44.9  2. Post-nasal drip R09.82        Plan:       Glad stable Some increased post nasal drip due to weather  Plan Continue symbicort 2 puff twice daily Use nasal steroid as needed Use albuterol as needed Continue [employer] exercises FLu shot in fall Please talk to PCP Marda Stalker, PA-C -  and ensure you get  shingrix vaccine  Followup 1 year or sooner; CAT Score at followup    Dr. Brand Males, M.D., [facility].C.P Pulmonary and Critical Care Medicine Staff Physician, [place] Director - Interstitial Lung Disease  Program  Pulmonary [place] [facility] [place], [place], [address]  Pager: [phone], If no answer or between  15:00h - 7:00h: call [phone] Telephone: [phone]

## 2018-10-27 NOTE — Progress Notes (Signed)
_0  ID: Kelly Delgado, female    DOB: 04-29-1936, 83 y.o.   MRN: [id number]  Chief Complaint  Patient presents with  . Acute Visit    Cough    Referring provider: Lenetta Quaker  HPI:  83 year old female former smoker followed in our office for gold 2 COPD, history of stable pulmonary nodules on 2016 chest CT  PMH: Asthmatic bronchitis, Basal cell carcinoma of breast (1990's?), GERD (gastroesophageal reflux disease), H/O hiatal hernia, Hypertension, Migraines, Schatzki's ring, and Shortness of breath. Smoker/ Smoking History: Former Smoker. 4.5 pack year smoking history  Maintenance:  Symbicort 80, intolerance to spiriva - itching Pt of: Dr. Chase Caller  10/28/2018  - Visit   83 year old female patient presenting today for acute visit.  Patient with persisting cough that continues to worsen.  This cough is productive with yellow-green mucus.  Patient remains adherent to Symbicort 80.  Patient reports that symptoms started on 10/19/2018.  Symptoms felt more like an upper respiratory infection but symptoms have persisted and worsened.  Patient has increased fatigue, chills.  Patient reports multiple coughing episodes a day that also affects her sleep.  Coughing so severe that stomach hurts as well as feels like she may throw up.  Patient tried over-the-counter Mucinex DM no results.  Patient reports some wheezing at night when laying flat.  MMRC - Breathlessness Score 1 - I get short of breath when hurrying on level ground or walking up a slight hill      Tests:  07/04/2015-CT chest without contrast-7 scattered tiny pulmonary nodules throughout both lungs, largest 4 mm all unchanged since 2014, 2-year stability most keeping with benign pulmonary nodules  07/23/2015- pre-and post spirometry- FVC 2.51 (77% predicted), postbronchodilator ratio 76, FEV1 79, slight mid flow reversibility after bronchodilator, no significant bronchodilator response, >>>Minimal obstructive airways  disease  FENO:  No results found for: NITRICOXIDE  PFT: PFT Results Latest Ref Rng & Units 07/23/2015  FVC-Pre L 2.51  FVC-Predicted Pre % 77  FVC-Post L 2.55  FVC-Predicted Post % 79  Pre FEV1/FVC % % 74  Post FEV1/FCV % % 76  FEV1-Pre L 1.85  FEV1-Predicted Pre % 76  FEV1-Post L 1.93    Imaging: No results found.    Specialty Problems      Pulmonary Problems   Lung nodule   Stage 2 moderate COPD by GOLD classification (HCC) with intolerace (itch) to spiriva   Acute bronchitis with COPD (HCC)      Allergies  Allergen Reactions  . Statins Other (See Comments)    Increased LFT's  . Spiriva [Tiotropium Bromide Monohydrate] Itching    Immunization History  Administered Date(s) Administered  . Influenza Whole 06/27/2012  . Influenza, High Dose Seasonal PF 08/14/2017  . Influenza,inj,Quad PF,6+ Mos 07/17/2014, 07/23/2015, 07/27/2016  . Pneumococcal Conjugate-13 07/17/2014  . Pneumococcal-Unspecified 06/27/2012    Past Medical History:  Diagnosis Date  . Asthmatic bronchitis   . Basal cell carcinoma of breast 1990's?   "left side, burned it off" (06/07/2013)  . GERD (gastroesophageal reflux disease)   . H/O hiatal hernia   . Hypertension   . Migraines    "once a year usually" (06/07/2013)  . Schatzki's ring   . Shortness of breath    "related to asthmatic bronchitis only" (06/07/2013)    Tobacco History: Social History   Tobacco Use  Smoking Status Former Smoker  . Packs/day: 0.33  . Years: 13.00  . Pack years: 4.29  . Types: Cigarettes  . Last attempt to  quit: 11/27/1968  . Years since quitting: 49.9  Smokeless Tobacco Never Used   Counseling given: Yes  Continue to not smoke  Outpatient Encounter Medications as of 10/28/2018  Medication Sig  . albuterol (PROAIR HFA) 108 (90 Base) MCG/ACT inhaler Inhale 2 puffs into the lungs every 6 (six) hours as needed for wheezing.  Marland Kitchen albuterol (PROVENTIL) (2.5 MG/3ML) 0.083% nebulizer solution Take 3 mL (2.5  mg total) by nebulization every 6 (six) hours as needed for wheezing or shortness of breath (Dispense nebulizer kit with medication).  Marland Kitchen aspirin EC 81 MG tablet Take 81 mg by mouth daily.  . budesonide-formoterol (SYMBICORT) 80-4.5 MCG/ACT inhaler INHALE 2 PUFFS INTO THE LUNGS 2 TIMES DAILY  . Calcium Carb-Cholecalciferol (CALCIUM 600 + D PO) Take 1 tablet by mouth every other day.   . Cholecalciferol (VITAMIN D-3 PO) Take 2,000 Units by mouth daily.   . hydrochlorothiazide (HYDRODIURIL) 25 MG tablet Take 25 mg by mouth daily as needed (for swelling).   . [DISCONTINUED] losartan (COZAAR) 50 MG tablet Take 1 tablet (50 mg total) by mouth daily.  . benzonatate (TESSALON) 200 MG capsule Take 1 capsule (200 mg total) by mouth 3 (three) times daily as needed for cough.  . doxycycline (VIBRA-TABS) 100 MG tablet Take 1 tablet (100 mg total) by mouth 2 (two) times daily.  Marland Kitchen HYDROcodone-homatropine (HYCODAN) 5-1.5 MG/5ML syrup Take 5 mLs by mouth every 6 (six) hours as needed for cough.  . [DISCONTINUED] HYDROcodone-homatropine (HYCODAN) 5-1.5 MG/5ML syrup Take 5 mLs by mouth every 6 (six) hours as needed for cough.   No facility-administered encounter medications on file as of 10/28/2018.      Review of Systems  Review of Systems  Constitutional: Positive for chills and fatigue. Negative for fever and unexpected weight change.  HENT: Positive for congestion. Negative for ear pain, postnasal drip, sinus pressure and sinus pain.   Respiratory: Positive for cough (productive cough with yellow / green color ) and wheezing. Negative for chest tightness and shortness of breath.   Cardiovascular: Negative for chest pain and palpitations.  Gastrointestinal: Negative for diarrhea, nausea and vomiting.  Musculoskeletal: Negative for arthralgias.  Skin: Negative for color change.  Allergic/Immunologic: Negative for environmental allergies and food allergies.  Neurological: Negative for dizziness,  light-headedness and headaches.  Psychiatric/Behavioral: Positive for sleep disturbance. Negative for dysphoric mood. The patient is not nervous/anxious.   All other systems reviewed and are negative.    Physical Exam  BP 128/72 (BP Location: Left Arm, Cuff Size: Normal)   Pulse 71   Temp 98.7 F (37.1 C) (Oral)   Ht 5' 7.5" (1.715 m)   Wt 182 lb 6.4 oz (82.7 kg)   SpO2 95%   BMI 28.15 kg/m   Wt Readings from Last 5 Encounters:  10/28/18 182 lb 6.4 oz (82.7 kg)  05/18/18 186 lb 9.6 oz (84.6 kg)  08/14/17 181 lb 6.4 oz (82.3 kg)  02/09/17 181 lb 3.2 oz (82.2 kg)  11/24/16 180 lb (81.6 kg)    Physical Exam  Constitutional: She is oriented to person, place, and time and well-developed, well-nourished, and in no distress. No distress.  HENT:  Head: Normocephalic and atraumatic.  Right Ear: Hearing, tympanic membrane, external ear and ear canal normal.  Left Ear: Hearing, tympanic membrane, external ear and ear canal normal.  Nose: Mucosal edema and rhinorrhea present. Right sinus exhibits no maxillary sinus tenderness and no frontal sinus tenderness. Left sinus exhibits no maxillary sinus tenderness and no frontal sinus  tenderness.  Mouth/Throat: Uvula is midline and oropharynx is clear and moist. No oropharyngeal exudate.  + Postnasal drip + TMs with effusion without infection bilaterally  Eyes: Pupils are equal, round, and reactive to light.  Neck: Normal range of motion. Neck supple. No JVD present.  Cardiovascular: Normal rate, regular rhythm and normal heart sounds.  Pulmonary/Chest: Effort normal. No accessory muscle usage. No respiratory distress. She has no decreased breath sounds. She has no wheezes. She has no rhonchi. She has no rales.  Abdominal: Soft. Bowel sounds are normal. There is no abdominal tenderness.  Musculoskeletal: Normal range of motion.        General: No edema.  Lymphadenopathy:    She has no cervical adenopathy.  Neurological: She is alert and  oriented to person, place, and time. Gait normal.  Skin: Skin is warm and dry. She is not diaphoretic. No erythema.  Psychiatric: Mood, memory, affect and judgment normal.  Nursing note and vitals reviewed.     Lab Results:  CBC    Component Value Date/Time   WBC 9.0 07/17/2017 2213   RBC 4.90 07/17/2017 2213   HGB 14.4 07/17/2017 2213   HCT 42.9 07/17/2017 2213   PLT 197 07/17/2017 2213   MCV 87.6 07/17/2017 2213   MCH 29.4 07/17/2017 2213   MCHC 33.6 07/17/2017 2213   RDW 13.7 07/17/2017 2213   LYMPHSABS 0.9 11/24/2016 1025   MONOABS 0.5 11/24/2016 1025   EOSABS 0.0 11/24/2016 1025   BASOSABS 0.0 11/24/2016 1025    BMET    Component Value Date/Time   NA 137 07/17/2017 2213   K 4.1 07/17/2017 2213   CL 104 07/17/2017 2213   CO2 24 07/17/2017 2213   GLUCOSE 101 (H) 07/17/2017 2213   BUN 20 07/17/2017 2213   CREATININE 0.92 07/17/2017 2213   CALCIUM 9.3 07/17/2017 2213   GFRNONAA 58 (L) 07/17/2017 2213   GFRAA >60 07/17/2017 2213    BNP No results found for: BNP  ProBNP No results found for: PROBNP    Assessment & Plan:   Pleasant 83 year old female patient completing acute visit with our office today.  Patient with acute bronchitis in the setting of COPD.  Patient does not have any wheezing on exam today.  Will treat with doxycycline and a chest x-ray.  Will prescribe Tessalon Perles as well as Hycodan syrup for management of cough.  Patient can also use Delsym over-the-counter cough medicine.  Before prescribing the patient with Hycodan cough syrup, I have checked [place] PMP aware and the patients overdose risk score is 110. Patient has 1 providers prescribing controlled substances. Patient has used 1 pharmacies. I have counseled the patient on the sedative effects of Hycodan. Patient to use this medication sparingly and not when driving, drinking alcohol, or using additional sedative medications. Patient has been prescribed 240 mL with no refills.   Explained to  patient that if symptoms are not improving to follow-up with our office sooner.  If not she can keep scheduled follow-up in July/2020 with Dr. Chase Caller.  Lung nodule Stable on last CT We will continue to monitor Follow-up with Dr. Chase Caller in July/2020  Acute bronchitis with COPD ([place]) Doxycycline >>> 1 100 mg tablet every 12 hours for 7 days >>>take with food  >>>wear sunscreen   Chest xray today   Can use Hycodan cough syrup at night to help suppress cough >>>This will sedate you make you sleepy  Can use Tessalon Perles 1 Perle every 8 hours as needed for  cough suppressant during the day Can also use Delsym over-the-counter cough medicine during the day  Follow up in July/2020 with Dr. Chase Caller >>> Or sooner if symptoms do not improve  Stage 2 moderate COPD by GOLD classification ([place]) with intolerace (itch) to spiriva Chest xray today   Continue Symbicort 80 >>> 2 puffs in the morning right when you wake up, rinse out your mouth after use, 12 hours later 2 puffs, rinse after use >>> Take this daily, no matter what >>> This is not a rescue inhaler   Follow up in July/2020 with Dr. Chase Caller >>> Or sooner if symptoms do not improve     Lauraine Rinne, NP 10/28/2018   This appointment was 32 min long with over 50% of the time in direct face-to-face patient care, assessment, plan of care, and follow-up.

## 2018-10-28 ENCOUNTER — Ambulatory Visit (INDEPENDENT_AMBULATORY_CARE_PROVIDER_SITE_OTHER)
Admission: RE | Admit: 2018-10-28 | Discharge: 2018-10-28 | Disposition: A | Discharge status: Home / Self Care | Payer: Medicare Other | Source: Ambulatory Visit | Attending: Pulmonary Disease | Admitting: Pulmonary Disease

## 2018-10-28 ENCOUNTER — Ambulatory Visit: Payer: Medicare Other | Admitting: Pulmonary Disease

## 2018-10-28 ENCOUNTER — Encounter: Payer: Self-pay | Admitting: Pulmonary Disease

## 2018-10-28 VITALS — BP 128/72 | HR 71 | Temp 98.7°F | Ht 67.5 in | Wt 182.4 lb

## 2018-10-28 DIAGNOSIS — R911 Solitary pulmonary nodule: Secondary | ICD-10-CM

## 2018-10-28 DIAGNOSIS — J449 Chronic obstructive pulmonary disease, unspecified: Secondary | ICD-10-CM

## 2018-10-28 DIAGNOSIS — J209 Acute bronchitis, unspecified: Secondary | ICD-10-CM

## 2018-10-28 DIAGNOSIS — J441 Chronic obstructive pulmonary disease with (acute) exacerbation: Secondary | ICD-10-CM | POA: Insufficient documentation

## 2018-10-28 DIAGNOSIS — J440 Chronic obstructive pulmonary disease with acute lower respiratory infection: Secondary | ICD-10-CM

## 2018-10-28 MED ORDER — HYDROCODONE-HOMATROPINE 5-1.5 MG/5ML PO SYRP: 5.0000 mL | ORAL_SOLUTION | Freq: Four times a day (QID) | ORAL | 0 refills | Status: DC | PRN

## 2018-10-28 MED ORDER — DOXYCYCLINE HYCLATE 100 MG PO TABS: 100.0000 mg | ORAL_TABLET | Freq: Two times a day (BID) | ORAL | 0 refills | Status: DC

## 2018-10-28 MED ORDER — BENZONATATE 200 MG PO CAPS: 200.0000 mg | ORAL_CAPSULE | Freq: Three times a day (TID) | ORAL | 1 refills | Status: DC | PRN

## 2018-10-28 NOTE — Assessment & Plan Note (Signed)
Doxycycline >>> 1 100 mg tablet every 12 hours for 7 days >>>take with food  >>>wear sunscreen   Chest xray today   Can use Hycodan cough syrup at night to help suppress cough >>>This will sedate you make you sleepy  Can use Tessalon Perles 1 Perle every 8 hours as needed for cough suppressant during the day Can also use Delsym over-the-counter cough medicine during the day  Follow up in July/2020 with Dr. Chase Caller >>> Or sooner if symptoms do not improve

## 2018-10-28 NOTE — Assessment & Plan Note (Signed)
Chest xray today   Continue Symbicort 80 >>> 2 puffs in the morning right when you wake up, rinse out your mouth after use, 12 hours later 2 puffs, rinse after use >>> Take this daily, no matter what >>> This is not a rescue inhaler   Follow up in July/2020 with Dr. Chase Caller >>> Or sooner if symptoms do not improve

## 2018-10-28 NOTE — Progress Notes (Signed)
Your chest x-ray results of come back.  Showing no acute changes.  No plan of care changes at this time.  Keep follow-up appointment.    Follow-up with our office if symptoms worsen or you do not feel like you are improving under her current regimen.  It was a pleasure taking care of you,  Brian Mack, FNP 

## 2018-10-28 NOTE — Assessment & Plan Note (Signed)
Stable on last CT We will continue to monitor Follow-up with Dr. Chase Caller in July/2020

## 2018-10-28 NOTE — Patient Instructions (Addendum)
Doxycycline >>> 1 100 mg tablet every 12 hours for 7 days >>>take with food  >>>wear sunscreen   Chest xray today   Can use Hycodan cough syrup at night to help suppress cough >>>This will sedate you make you sleepy  Can use Tessalon Perles 1 Perle every 8 hours as needed for cough suppressant during the day Can also use Delsym over-the-counter cough medicine during the day  Continue Symbicort 80 >>> 2 puffs in the morning right when you wake up, rinse out your mouth after use, 12 hours later 2 puffs, rinse after use >>> Take this daily, no matter what >>> This is not a rescue inhaler   Follow up in July/2020 with Dr. Chase Caller >>> Or sooner if symptoms do not improve  It is flu season:   >>>Remember to be washing your hands regularly, using hand sanitizer, be careful to use around herself with has contact with people who are sick will increase her chances of getting sick yourself. >>> Best ways to protect herself from the flu: Receive the yearly flu vaccine, practice good hand hygiene washing with soap and also using hand sanitizer when available, eat a nutritious meals, get adequate rest, hydrate appropriately   Please contact the office if your symptoms worsen or you have concerns that you are not improving.   Thank you for choosing [facility] for your healthcare, and for allowing [place] to partner with you on your healthcare journey. I am thankful to be able to provide care to you today.   Wyn Quaker FNP-C

## 2018-11-08 ENCOUNTER — Other Ambulatory Visit: Payer: Self-pay | Admitting: Family Medicine

## 2018-11-08 DIAGNOSIS — E2839 Other primary ovarian failure: Secondary | ICD-10-CM

## 2018-11-08 DIAGNOSIS — Z1231 Encounter for screening mammogram for malignant neoplasm of breast: Secondary | ICD-10-CM

## 2018-11-15 ENCOUNTER — Other Ambulatory Visit: Payer: Self-pay | Admitting: Emergency Medicine

## 2018-11-15 NOTE — Telephone Encounter (Signed)
This is an MR pt.

## 2018-12-30 ENCOUNTER — Ambulatory Visit
Admission: RE | Admit: 2018-12-30 | Discharge: 2018-12-30 | Disposition: A | Discharge status: Home / Self Care | Payer: Medicare Other | Source: Ambulatory Visit | Attending: Family Medicine | Admitting: Family Medicine

## 2018-12-30 DIAGNOSIS — E2839 Other primary ovarian failure: Secondary | ICD-10-CM

## 2018-12-30 DIAGNOSIS — Z1231 Encounter for screening mammogram for malignant neoplasm of breast: Secondary | ICD-10-CM

## 2019-04-22 ENCOUNTER — Telehealth: Payer: Self-pay | Admitting: Internal Medicine

## 2019-04-22 MED ORDER — ALBUTEROL SULFATE HFA 108 (90 BASE) MCG/ACT IN AERS: 2.0000 | INHALATION_SPRAY | Freq: Four times a day (QID) | RESPIRATORY_TRACT | 3 refills | Status: DC | PRN

## 2019-04-22 NOTE — Telephone Encounter (Signed)
Called and spoke with pt to verify if it was the albuterol inhaler or the neb sol that she was needing a refill of and pt stated it was the inhaler. Verified preferred pharmacy with pt and have sent Rx in for pt. Nothing further needed.

## 2019-05-17 ENCOUNTER — Other Ambulatory Visit: Payer: Self-pay | Admitting: *Deleted

## 2019-05-17 MED ORDER — BUDESONIDE-FORMOTEROL FUMARATE 80-4.5 MCG/ACT IN AERO: INHALATION_SPRAY | RESPIRATORY_TRACT | 11 refills | Status: DC

## 2019-12-22 ENCOUNTER — Other Ambulatory Visit: Payer: Self-pay | Admitting: Internal Medicine

## 2020-05-26 ENCOUNTER — Other Ambulatory Visit: Payer: Self-pay | Admitting: Internal Medicine

## 2020-06-01 ENCOUNTER — Other Ambulatory Visit: Payer: Self-pay | Admitting: Internal Medicine

## 2020-06-14 ENCOUNTER — Other Ambulatory Visit: Payer: Self-pay

## 2020-06-14 ENCOUNTER — Ambulatory Visit: Payer: Medicare Other | Admitting: Internal Medicine

## 2020-06-14 ENCOUNTER — Encounter: Payer: Self-pay | Admitting: Internal Medicine

## 2020-06-14 VITALS — BP 118/68 | HR 66 | Temp 97.8°F | Ht 67.0 in | Wt 180.0 lb

## 2020-06-14 DIAGNOSIS — J449 Chronic obstructive pulmonary disease, unspecified: Secondary | ICD-10-CM | POA: Diagnosis not present

## 2020-06-14 DIAGNOSIS — Z7189 Other specified counseling: Secondary | ICD-10-CM

## 2020-06-14 MED ORDER — ALBUTEROL SULFATE HFA 108 (90 BASE) MCG/ACT IN AERS: INHALATION_SPRAY | RESPIRATORY_TRACT | 6 refills | Status: DC

## 2020-06-14 MED ORDER — BUDESONIDE-FORMOTEROL FUMARATE 80-4.5 MCG/ACT IN AERO: INHALATION_SPRAY | RESPIRATORY_TRACT | 9 refills | Status: DC

## 2020-06-14 NOTE — Patient Instructions (Signed)
Stage 2 moderate COPD by GOLD classification ([place]) with intolerace (itch) to spiriva  -COPD stable without any flareup at this current point time  Plan -We took a shared decision making that we will hold off on doing any CT scan of the chest based on current medical evidence -Continue Symbicort daily as before with albuterol as needed [CMA will do refill]  Counseled about COVID-19 virus infection  -Respect the fact that you are afraid about the vaccine and the side effects against Covid  -Agree that similar to thalidomide that we do not have long-term data which would only have in a few years -In the absence of vaccine as discussed the next best medication efforts are  -Social distancing with masking and you are doing an amazing job of that  -In the event you get exposed to Covid or you end up having Covid please remember thiere is option of Regeneron monoclonal antibodies   Followup  - 9 -1 2 months or sooner if needed

## 2020-06-14 NOTE — Progress Notes (Signed)
OV 07/23/2015  Chief Complaint  Patient presents with  . Follow-up    Pt here to discuss CT results and PFT results. Pt states breathing is better since last visit. She is now walking 2 miles three times a week     Follow-up Gold stage II COPD: Currently only on Symbicort. She stopped Spiriva due to itching. She also stopped [place] in past due to cost issues. With Symbicort she is doing really well. She is walking 2 miles a week. Her effort tolerance is improved. She is hardly ever symptomatic. FEV1 today 1.85 L/76% and a ratio of 74 showing very mild obstruction. There are no new issues. She will have a flu shot today. We discussed switching to a long-acting anticholinergic associated with long-acting beta agonist and skipping inhaled steroidal given recent history of hip fracture but she's not interested to change due to cost issues and because she's currently doing well  Multiple lung nodules: She had CT scan of the chest 08/03/2015: She has multiple lung nodules largest 4 mm. This is all stable since October 2014 completing near 2 year stability. She is currently 84 years of age and therefore will not qualify for future low-dose lung cancer screening CT   OV 04/30/2016  Chief Complaint  Patient presents with  . Follow-up    breathing has been doing well.  no concerns.CAT Score: 11    Follow-up moderate COPD he is a 9 month routine follow-up. She is doing well. No urgent care visits. No emergency room visits. No prednisone use. No new medical problems diagnosis. [place] and now she has a scratchy throat but several weeks ago she had a cold and did not flareup in the COPD exacerbation. She wants to keep an eye on her cold. She's not interested in inhaler change. She likes her Symbicort. She has enough refills and will call us if needed. She's got good quality of life.   OV 02/09/2017  Chief Complaint  Patient presents with  . Follow-up    Pt states  she feels the Symbicort 160 helps better than the 80, she is currently on the 80 dose. Pt c/o throat tickle, hoarseness, and SOB with over activity.  Pt denies chest congestion, CP/tightness.     Follow-up moderate COPD  Since last visit I dropped her Symbicort to the lower dose. She's had 2 exacerbations since then. She also believes that the lower dose Symbicort is not controlling her COPD as well but she's not fully sure if it is a steroid effect on just an individual variation depending on the season. In December she does not want to go up on the steroid dose to a full dose Symbicort. In the past. Event caused itching so this time she wants to hold off on any anticholinergic but if she continues to feel that lower dose Symbicort is not controlling a COPD well then she'll be open to adding a different anticholinergic other than Spiriva going up on the steroid dose. Otherwise doing well. The spring season though is causing some postnasal drainage and increased cough and ticklish throat.   OV 08/14/2017  Chief Complaint  Patient presents with  . Follow-up    Pt states that she has been doing good. Pt is currently on presnisone due to arthritis but has two days left of that. Was a little SOB but the prednisone she is currently on is helping with that.   Follow-up moderate COPD  Last seen April 2018. Normally does 9 month follow-up. But at last visit. Drop the steroid dose and therefore she is here in 6 months. COPD stable no exacerbations. Recently shehad neck spasm and is currently finishing a prednisone taper given by orthopedics and this is actually helping his COPD symptoms. COPD cat score is 7 and only shows mild symptom burden. She wants flu shot. No new issues. Chest x-ray September 2018 per report is clear   OV 05/18/2018  Chief Complaint  Patient presents with  . Follow-up    Pt states breathing has been worse with the humidity and hot weather. States she has been having to use  rescue inhaler at least twice a day. Pt also states she has been a little more SOB recently, has an occ. cough with clear mucus, but denies any CP.    Follow-up moderate COPD  This is a routine follow-up. She normally does 9 month follow-ups. Last visit October 2018 periods in the interim she's continued to do well. COPD cat score is 6 and slightly better/same compared to the before. She does daily exercises at [facility] which helps. Recently with humidity she is dealing with some increased postnasal drip for which she takes nasal steroids as needed. In the interim has been no medical issues are ER visits or urgent care visits or prednisone use or new medical diagnoses. Last chest x-ray September 2018 clear      OV 06/14/2020  Subjective:  Patient ID: Kelly Delgado, female , DOB: 04/08/1936 , age 84 y.o. , MRN: [id number] , [address]: [address] [place] [address]   06/14/2020 -   Chief Complaint  Patient presents with  . Follow-up    Productive cough with clear phlegm   Follow-up moderate COPD.  HPI Kelly Delgado 84 y.o. -last seen by myself just over 2 years ago in July 2019.  Then in January 2020 she had a COPD exacerbation and seen by nurse practitioner.  Then after that the COVID-19 pandemic struck.  Since then she has been isolating.  We have not been able to see her till today.  She says she is doing well.  She is on Symbicort.  She wants refills.  COPD CAT score is stable.  No interim medical issues no ER visits.  She normally gets vaccines.  However she is reluctant to get the Covid vaccine because of lack of long-term side effects.  She recalls the thalidomide disaster that happened when it was 5 years into the drug and people discovered the side effects.  I acknowledge that we do not have long-term side effect profile on Covid vaccine because of the short duration of the pandemic.  She is really good at social distancing and masking.  We discussed Regeneron monoclonal antibodies  as a way to mitigate her risk in terms of both prophylaxis and treating infection.     CAT COPD Symptom & Quality of Life Score (GSK trademark) 0 is no burden. 5 is highest burden 05/18/2018   Never Cough -> Cough all the time 2  No phlegm in chest -> Chest is full of phlegm 0  No chest tightness -> Chest feels very tight 0  No dyspnea for 1 flight stairs/hill -> Very dyspneic for 1 flight of stairs 3  No limitations for ADL at home -> Very limited with ADL at home 0  Confident leaving home -> Not at all confident leaving home 0  Sleep soundly -> Do not sleep soundly because of lung condition  0  Lots of Energy -> No energy at all 1  TOTAL Score (max 40)  6   CAT Score 06/14/2020 08/14/2017 04/30/2016  Total CAT Score $RemoveB'11 7 11       'LxzzPzHW$ ROS - per HPI     has a past medical history of Asthmatic bronchitis, Basal cell carcinoma of breast (1990's?), GERD (gastroesophageal reflux disease), H/O hiatal hernia, Hypertension, Migraines, Schatzki's ring, and Shortness of breath.   reports that she quit smoking about 51 years ago. Her smoking use included cigarettes. She has a 4.29 pack-year smoking history. She has never used smokeless tobacco.  Past Surgical History:  Procedure Laterality Date  . ABDOMINAL HYSTERECTOMY  1980's  . CATARACT EXTRACTION W/ INTRAOCULAR LENS  IMPLANT, BILATERAL Bilateral 1990's  . DILATION AND CURETTAGE OF UTERUS  1980's   "1" (06/07/2013)  . ESOPHAGEAL DILATION  2012   "just once" (06/07/2013)  . HIP PINNING,CANNULATED Right 10/09/2014   Procedure: CANNULATED HIP PINNING;  Surgeon: Renette Butters, MD;  Location: [facility];  Service: Orthopedics;  Laterality: Right;  . URETHRAL DIVERTICULUM REPAIR  1970's    Allergies  Allergen Reactions  . Statins Other (See Comments)    Increased LFT's  . Spiriva [Tiotropium Bromide Monohydrate] Itching    Immunization History  Administered Date(s) Administered  . Influenza Whole 06/27/2012  . Influenza, High Dose  Seasonal PF 08/14/2017  . Influenza,inj,Quad PF,6+ Mos 07/17/2014, 07/23/2015, 07/27/2016  . Pneumococcal Conjugate-13 07/17/2014  . Pneumococcal-Unspecified 06/27/2012    Family History  Problem Relation Age of Onset  . Cancer Mother   . Stroke Mother   . Hypertension Mother      Current Outpatient Medications:  .  albuterol (PROVENTIL) (2.5 MG/3ML) 0.083% nebulizer solution, Take 3 mL (2.5 mg total) by nebulization every 6 (six) hours as needed for wheezing or shortness of breath (Dispense nebulizer kit with medication)., Disp: 75 mL, Rfl: 12 .  albuterol (VENTOLIN HFA) 108 (90 Base) MCG/ACT inhaler, INHALE 2 PUFFS INTO LUNGS EVERY 6 HRS AS NEEDED FOR WHEEZING/SHORTNESS OF BREATH *NEEDS APPOINTMENT, Disp: 18 g, Rfl: 6 .  aspirin EC 81 MG tablet, Take 81 mg by mouth daily., Disp: , Rfl:  .  budesonide-formoterol (SYMBICORT) 80-4.5 MCG/ACT inhaler, INHALE 2 PUFFS BY MOUTH INTO THE LUNGS TWICE DAILY, Disp: 10.2 g, Rfl: 9 .  Calcium Carb-Cholecalciferol (CALCIUM 600 + D PO), Take 1 tablet by mouth every other day. , Disp: , Rfl:  .  Cholecalciferol (VITAMIN D-3 PO), Take 2,000 Units by mouth daily. , Disp: , Rfl:  .  hydrochlorothiazide (HYDRODIURIL) 25 MG tablet, Take 25 mg by mouth daily as needed (for swelling). , Disp: , Rfl:       Objective:   Vitals:   06/14/20 1633  BP: 118/68  Pulse: 66  Temp: 97.8 F (36.6 C)  TempSrc: Other (Comment)  SpO2: 97%  Weight: 180 lb (81.6 kg)  Height: $Remove'5\' 7"'bGMRivS$  (1.702 m)    Estimated body mass index is 28.19 kg/m as calculated from the following:   Height as of this encounter: $RemoveBeforeD'5\' 7"'dSqJUXSaINSPTm$  (1.702 m).   Weight as of this encounter: 180 lb (81.6 kg).  $Rem'@WEIGHTCHANGE'Kclx$ @  [employer]   06/14/20 1633  Weight: 180 lb (81.6 kg)     Physical Exam  General Appearance:    Alert, cooperative, no distress, appears stated age - yes , Deconditioned looking - no , OBESE  - no, Sitting on Wheelchair -  no  Head:    Normocephalic, without obvious  abnormality, atraumatic  Eyes:    PERRL, conjunctiva/corneas clear,  Ears:    Normal TM's and external ear canals, both ears  Nose:   Nares normal, septum midline, mucosa normal, no drainage    or sinus tenderness. OXYGEN ON  - no . Patient is @ ra   Throat:   Lips, mucosa, and tongue normal; teeth and gums normal. Cyanosis on lips - no  Neck:   Supple, symmetrical, trachea midline, no adenopathy;    thyroid:  no enlargement/tenderness/nodules; no carotid   bruit or JVD  Back:     Symmetric, no curvature, ROM normal, no CVA tenderness  Lungs:     Distress - no , Wheeze no, Barrell Chest - no, Purse lip breathing - no, Crackles - no   Chest Wall:    No tenderness or deformity.    Heart:    Regular rate and rhythm, S1 and S2 normal, no rub   or gallop, Murmur - no  Breast Exam:    NOT DONE  Abdomen:     Soft, non-tender, bowel sounds active all four quadrants,    no masses, no organomegaly. Visceral obesity - no  Genitalia:   NOT DONE  Rectal:   NOT DONE  Extremities:   Extremities - normal, Has Cane - no, Clubbing - no, Edema - no  Pulses:   2+ and symmetric all extremities  Skin:   Stigmata of Connective Tissue Disease - no  Lymph nodes:   Cervical, supraclavicular, and axillary nodes normal  Psychiatric:  Neurologic:   Pleasant - yes, Anxious - no, Flat affect - no  CAm-ICU - neg, Alert and Oriented x 3 - yes, Moves all 4s - yes, Speech - normal, Cognition - intact           Assessment:       ICD-10-CM   1. Stage 2 moderate COPD by GOLD classification (HCC) with intolerace (itch) to spiriva  J44.9   2. Counseled about COVID-19 virus infection  Z71.89        Plan:     Patient Instructions  Stage 2 moderate COPD by GOLD classification (HCC) with intolerace (itch) to spiriva  -COPD stable without any flareup at this current point time  Plan -We took a shared decision making that we will hold off on doing any CT scan of the chest based on current medical  evidence -Continue Symbicort daily as before with albuterol as needed [CMA will do refill]  Counseled about COVID-19 virus infection  -Respect the fact that you are afraid about the vaccine and the side effects against Covid  -Agree that similar to thalidomide that we do not have long-term data which would only have in a few years -In the absence of vaccine as discussed the next best medication efforts are  -Social distancing with masking and you are doing an amazing job of that  -In the event you get exposed to Covid or you end up having Covid please remember thiere is option of Regeneron monoclonal antibodies   Followup  - 9 -1 2 months or sooner if needed     SIGNATURE    Dr. Kalman Shan, M.D., F.C.C.P,  Pulmonary and Critical Care Medicine Staff Physician, [facility] Center Director - Interstitial Lung Disease  Program  Pulmonary Fibrosis [facility] at [facility] [place], [place], [address]  Pager: [phone], If no answer or between  15:00h - 7:00h: call [phone] Telephone: [phone]  5:21 PM 06/14/2020

## 2020-11-26 DIAGNOSIS — K219 Gastro-esophageal reflux disease without esophagitis: Secondary | ICD-10-CM | POA: Diagnosis not present

## 2020-11-26 DIAGNOSIS — J449 Chronic obstructive pulmonary disease, unspecified: Secondary | ICD-10-CM | POA: Diagnosis not present

## 2020-11-26 DIAGNOSIS — M810 Age-related osteoporosis without current pathological fracture: Secondary | ICD-10-CM | POA: Diagnosis not present

## 2020-11-26 DIAGNOSIS — E7800 Pure hypercholesterolemia, unspecified: Secondary | ICD-10-CM | POA: Diagnosis not present

## 2020-11-26 DIAGNOSIS — E785 Hyperlipidemia, unspecified: Secondary | ICD-10-CM | POA: Diagnosis not present

## 2020-11-26 DIAGNOSIS — J45909 Unspecified asthma, uncomplicated: Secondary | ICD-10-CM | POA: Diagnosis not present

## 2020-11-26 DIAGNOSIS — I10 Essential (primary) hypertension: Secondary | ICD-10-CM | POA: Diagnosis not present

## 2020-12-26 DIAGNOSIS — E785 Hyperlipidemia, unspecified: Secondary | ICD-10-CM | POA: Diagnosis not present

## 2020-12-26 DIAGNOSIS — J45909 Unspecified asthma, uncomplicated: Secondary | ICD-10-CM | POA: Diagnosis not present

## 2020-12-26 DIAGNOSIS — E7800 Pure hypercholesterolemia, unspecified: Secondary | ICD-10-CM | POA: Diagnosis not present

## 2020-12-26 DIAGNOSIS — K219 Gastro-esophageal reflux disease without esophagitis: Secondary | ICD-10-CM | POA: Diagnosis not present

## 2020-12-26 DIAGNOSIS — J449 Chronic obstructive pulmonary disease, unspecified: Secondary | ICD-10-CM | POA: Diagnosis not present

## 2020-12-26 DIAGNOSIS — M810 Age-related osteoporosis without current pathological fracture: Secondary | ICD-10-CM | POA: Diagnosis not present

## 2020-12-26 DIAGNOSIS — I10 Essential (primary) hypertension: Secondary | ICD-10-CM | POA: Diagnosis not present

## 2021-02-21 DIAGNOSIS — M810 Age-related osteoporosis without current pathological fracture: Secondary | ICD-10-CM | POA: Diagnosis not present

## 2021-02-21 DIAGNOSIS — E785 Hyperlipidemia, unspecified: Secondary | ICD-10-CM | POA: Diagnosis not present

## 2021-02-21 DIAGNOSIS — K219 Gastro-esophageal reflux disease without esophagitis: Secondary | ICD-10-CM | POA: Diagnosis not present

## 2021-02-21 DIAGNOSIS — I10 Essential (primary) hypertension: Secondary | ICD-10-CM | POA: Diagnosis not present

## 2021-02-21 DIAGNOSIS — E7800 Pure hypercholesterolemia, unspecified: Secondary | ICD-10-CM | POA: Diagnosis not present

## 2021-02-21 DIAGNOSIS — J45909 Unspecified asthma, uncomplicated: Secondary | ICD-10-CM | POA: Diagnosis not present

## 2021-02-21 DIAGNOSIS — J449 Chronic obstructive pulmonary disease, unspecified: Secondary | ICD-10-CM | POA: Diagnosis not present

## 2021-03-13 DIAGNOSIS — J45909 Unspecified asthma, uncomplicated: Secondary | ICD-10-CM | POA: Diagnosis not present

## 2021-03-13 DIAGNOSIS — K219 Gastro-esophageal reflux disease without esophagitis: Secondary | ICD-10-CM | POA: Diagnosis not present

## 2021-03-13 DIAGNOSIS — M810 Age-related osteoporosis without current pathological fracture: Secondary | ICD-10-CM | POA: Diagnosis not present

## 2021-03-13 DIAGNOSIS — E7800 Pure hypercholesterolemia, unspecified: Secondary | ICD-10-CM | POA: Diagnosis not present

## 2021-03-13 DIAGNOSIS — J301 Allergic rhinitis due to pollen: Secondary | ICD-10-CM | POA: Diagnosis not present

## 2021-03-13 DIAGNOSIS — I10 Essential (primary) hypertension: Secondary | ICD-10-CM | POA: Diagnosis not present

## 2021-03-13 DIAGNOSIS — J449 Chronic obstructive pulmonary disease, unspecified: Secondary | ICD-10-CM | POA: Diagnosis not present

## 2021-03-13 DIAGNOSIS — E785 Hyperlipidemia, unspecified: Secondary | ICD-10-CM | POA: Diagnosis not present

## 2021-03-14 ENCOUNTER — Other Ambulatory Visit: Payer: Self-pay

## 2021-03-14 ENCOUNTER — Encounter: Payer: Self-pay | Admitting: Internal Medicine

## 2021-03-14 ENCOUNTER — Ambulatory Visit: Payer: Medicare Other | Admitting: Internal Medicine

## 2021-03-14 VITALS — BP 124/72 | HR 85 | Ht 67.0 in | Wt 185.6 lb

## 2021-03-14 DIAGNOSIS — J441 Chronic obstructive pulmonary disease with (acute) exacerbation: Secondary | ICD-10-CM

## 2021-03-14 DIAGNOSIS — J449 Chronic obstructive pulmonary disease, unspecified: Secondary | ICD-10-CM

## 2021-03-14 MED ORDER — PREDNISONE 10 MG PO TABS: ORAL_TABLET | ORAL | 0 refills | Status: AC

## 2021-03-14 MED ORDER — DOXYCYCLINE HYCLATE 100 MG PO TABS
100.0000 mg | ORAL_TABLET | Freq: Two times a day (BID) | ORAL | 0 refills | Status: DC
Start: 2021-03-14 — End: 2021-05-27

## 2021-03-14 NOTE — Progress Notes (Signed)
OV 07/23/2015  Chief Complaint  Patient presents with  . Follow-up    Pt here to discuss CT results and PFT results. Pt states breathing is better since last visit. She is now walking 2 miles three times a week     Follow-up Gold stage II COPD: Currently only on Symbicort. She stopped Spiriva due to itching. She also stopped [place] in past due to cost issues. With Symbicort she is doing really well. She is walking 2 miles a week. Her effort tolerance is improved. She is hardly ever symptomatic. FEV1 today 1.85 L/76% and a ratio of 74 showing very mild obstruction. There are no new issues. She will have a flu shot today. We discussed switching to a long-acting anticholinergic associated with long-acting beta agonist and skipping inhaled steroidal given recent history of hip fracture but she's not interested to change due to cost issues and because she's currently doing well  Multiple lung nodules: She had CT scan of the chest 08/03/2015: She has multiple lung nodules largest 4 mm. This is all stable since October 2014 completing near 2 year stability. She is currently 85 years of age and therefore will not qualify for future low-dose lung cancer screening CT   OV 04/30/2016  Chief Complaint  Patient presents with  . Follow-up    breathing has been doing well.  no concerns.CAT Score: 11    Follow-up moderate COPD he is a 9 month routine follow-up. She is doing well. No urgent care visits. No emergency room visits. No prednisone use. No new medical problems diagnosis. Few days ago [place] the [place] and now she has a scratchy throat but several weeks ago she had a cold and did not flareup in the COPD exacerbation. She wants to keep an eye on her cold. She's not interested in inhaler change. She likes her Symbicort. She has enough refills and will call us if needed. She's got good quality of life.   OV 02/09/2017  Chief Complaint  Patient presents with  . Follow-up    Pt states  she feels the Symbicort 160 helps better than the 80, she is currently on the 80 dose. Pt c/o throat tickle, hoarseness, and SOB with over activity.  Pt denies chest congestion, CP/tightness.     Follow-up moderate COPD  Since last visit I dropped her Symbicort to the lower dose. She's had 2 exacerbations since then. She also believes that the lower dose Symbicort is not controlling her COPD as well but she's not fully sure if it is a steroid effect on just an individual variation depending on the season. In December she does not want to go up on the steroid dose to a full dose Symbicort. In the past. Event caused itching so this time she wants to hold off on any anticholinergic but if she continues to feel that lower dose Symbicort is not controlling a COPD well then she'll be open to adding a different anticholinergic other than Spiriva going up on the steroid dose. Otherwise doing well. The spring season though is causing some postnasal drainage and increased cough and ticklish throat.   OV 08/14/2017  Chief Complaint  Patient presents with  . Follow-up    Pt states that she has been doing good. Pt is currently on presnisone due to arthritis but has two days left of that. Was a little SOB but the prednisone she is currently on is helping with that.   Follow-up moderate COPD  Last seen April 2018. Normally does 9 month follow-up. But at last visit. Drop the steroid dose and therefore she is here in 6 months. COPD stable no exacerbations. Recently shehad neck spasm and is currently finishing a prednisone taper given by orthopedics and this is actually helping his COPD symptoms. COPD cat score is 7 and only shows mild symptom burden. She wants flu shot. No new issues. Chest x-ray September 2018 per report is clear   OV 05/18/2018  Chief Complaint  Patient presents with  . Follow-up    Pt states breathing has been worse with the humidity and hot weather. States she has been having to use  rescue inhaler at least twice a day. Pt also states she has been a little more SOB recently, has an occ. cough with clear mucus, but denies any CP.    Follow-up moderate COPD  This is a routine follow-up. She normally does 9 month follow-ups. Last visit October 2018 periods in the interim she's continued to do well. COPD cat score is 6 and slightly better/same compared to the before. She does daily exercises at [facility] which helps. Recently with humidity she is dealing with some increased postnasal drip for which she takes nasal steroids as needed. In the interim has been no medical issues are ER visits or urgent care visits or prednisone use or new medical diagnoses. Last chest x-ray September 2018 clear      OV 06/14/2020  Subjective:  Patient ID: Kelly Delgado, female , DOB: 1936/07/18 , age 85 y.o. , MRN: [id number] , [address]: [address] [id number]   06/14/2020 -   Chief Complaint  Patient presents with  . Follow-up    Productive cough with clear phlegm   Follow-up moderate COPD.  HPI Kelly Delgado 85 y.o. -last seen by myself just over 2 years ago in July 2019.  Then in January 2020 she had a COPD exacerbation and seen by nurse practitioner.  Then after that the COVID-19 pandemic struck.  Since then she has been isolating.  We have not been able to see her till today.  She says she is doing well.  She is on Symbicort.  She wants refills.  COPD CAT score is stable.  No interim medical issues no ER visits.  She normally gets vaccines.  However she is reluctant to get the Covid vaccine because of lack of long-term side effects.  She recalls the thalidomide disaster that happened when it was 5 years into the drug and people discovered the side effects.  I acknowledge that we do not have long-term side effect profile on Covid vaccine because of the short duration of the pandemic.  She is really good at social distancing and masking.  We discussed Regeneron monoclonal antibodies  as a way to mitigate her risk in terms of both prophylaxis and treating infection.       OV 03/14/2021  Subjective:  Patient ID: Kelly Delgado, female , DOB: December 30, 1935 , age 29 y.o. , MRN: [id number] , [address]: [address] [place]  [id number] PCP Marda Stalker, PA-C Patient Care Team: Marda Stalker, PA-C as PCP - General (Family Medicine) Brand Males, MD as Consulting Physician (Pulmonary Disease)  This Provider for this visit: Treatment Team:  Attending Provider: Brand Males, MD    03/14/2021 -   Chief Complaint  Patient presents with  . Follow-up    Pt states she was doing okay since last visit until about 1 week ago due to having problems from  pollen and states she was having more problems with SOB and also states that she has been coughing which is also keeping her up at night.     HPI Kelly Delgado 84 y.o. -COPD follow-up.  In this visit she says she continues to use her Symbicort.  She brings her husband with her.  She normally exercises at the [facility].  She is not vaccinated against COVID and will not get vaccinated.  She says that she is not coming to close contact with anyone.  No clusters.  Other than when she exercises she is masking.  She avoids people.  However she got exposed to pollen late last week.  Then approximately on Friday, Mar 08, 2021 she worked in the garden with some hanging buds.  Then starting Sunday, Mar 10, 2021 she started getting cough with yellow sputum.  She sounds congested which she says is no sinus drainage.  She feels tired she is more short of breath.  There might be some associated increase in wheezing.  No fever.  The same day she checked her COVID rapid antigen test at home and was negative.  Husband is not sick.  She is not vaccinated against COVID no other sick contacts.  She does not think it is COVID but is willing to get tested with PCR.     CAT COPD Symptom & Quality of Life Score (GSK trademark) 0 is no  burden. 5 is highest burden 05/18/2018   Never Cough -> Cough all the time 2  No phlegm in chest -> Chest is full of phlegm 0  No chest tightness -> Chest feels very tight 0  No dyspnea for 1 flight stairs/hill -> Very dyspneic for 1 flight of stairs 3  No limitations for ADL at home -> Very limited with ADL at home 0  Confident leaving home -> Not at all confident leaving home 0  Sleep soundly -> Do not sleep soundly because of lung condition 0  Lots of Energy -> No energy at all 1  TOTAL Score (max 40)  6    CAT Score 06/14/2020 08/14/2017 04/30/2016  Total CAT Score '11 7 11       ' PFT  PFT Results Latest Ref Rng & Units 07/23/2015  FVC-Pre L 2.51  FVC-Predicted Pre % 77  FVC-Post L 2.55  FVC-Predicted Post % 79  Pre FEV1/FVC % % 74  Post FEV1/FCV % % 76  FEV1-Pre L 1.85  FEV1-Predicted Pre % 76  FEV1-Post L 1.93       has a past medical history of Asthmatic bronchitis, Basal cell carcinoma of breast (1990's?), GERD (gastroesophageal reflux disease), H/O hiatal hernia, Hypertension, Migraines, Schatzki's ring, and Shortness of breath.   reports that she quit smoking about 52 years ago. Her smoking use included cigarettes. She has a 4.29 pack-year smoking history. She has never used smokeless tobacco.  Past Surgical History:  Procedure Laterality Date  . ABDOMINAL HYSTERECTOMY  1980's  . CATARACT EXTRACTION W/ INTRAOCULAR LENS  IMPLANT, BILATERAL Bilateral 1990's  . DILATION AND CURETTAGE OF UTERUS  1980's   "1" (06/07/2013)  . ESOPHAGEAL DILATION  2012   "just once" (06/07/2013)  . HIP PINNING,CANNULATED Right 10/09/2014   Procedure: CANNULATED HIP PINNING;  Surgeon: Renette Butters, MD;  Location: [facility];  Service: Orthopedics;  Laterality: Right;  . URETHRAL DIVERTICULUM REPAIR  1970's    Allergies  Allergen Reactions  . Statins Other (See Comments)    Increased LFT's  . Spiriva [Tiotropium  Bromide Monohydrate] Itching    Immunization History  Administered  Date(s) Administered  . Influenza Whole 06/27/2012  . Influenza, High Dose Seasonal PF 08/14/2017  . Influenza,inj,Quad PF,6+ Mos 07/17/2014, 07/23/2015, 07/27/2016  . Pneumococcal Conjugate-13 07/17/2014  . Pneumococcal-Unspecified 06/27/2012    Family History  Problem Relation Age of Onset  . Cancer Mother   . Stroke Mother   . Hypertension Mother      Current Outpatient Medications:  .  albuterol (PROVENTIL) (2.5 MG/3ML) 0.083% nebulizer solution, Take 3 mL (2.5 mg total) by nebulization every 6 (six) hours as needed for wheezing or shortness of breath (Dispense nebulizer kit with medication)., Disp: 75 mL, Rfl: 12 .  albuterol (VENTOLIN HFA) 108 (90 Base) MCG/ACT inhaler, INHALE 2 PUFFS INTO LUNGS EVERY 6 HRS AS NEEDED FOR WHEEZING/SHORTNESS OF BREATH *NEEDS APPOINTMENT, Disp: 18 g, Rfl: 6 .  budesonide-formoterol (SYMBICORT) 80-4.5 MCG/ACT inhaler, INHALE 2 PUFFS BY MOUTH INTO THE LUNGS TWICE DAILY, Disp: 10.2 g, Rfl: 9 .  Calcium Carb-Cholecalciferol (CALCIUM 600 + D PO), Take 1 tablet by mouth every other day. , Disp: , Rfl:  .  Cholecalciferol (VITAMIN D-3 PO), Take 2,000 Units by mouth daily. , Disp: , Rfl:  .  doxycycline (VIBRA-TABS) 100 MG tablet, Take 1 tablet (100 mg total) by mouth 2 (two) times daily., Disp: 10 tablet, Rfl: 0 .  hydrochlorothiazide (HYDRODIURIL) 25 MG tablet, Take 25 mg by mouth daily as needed (for swelling). , Disp: , Rfl:  .  predniSONE (DELTASONE) 10 MG tablet, Take 4 tablets (40 mg total) by mouth daily with breakfast for 1 day, THEN 3 tablets (30 mg total) daily with breakfast for 1 day, THEN 2 tablets (20 mg total) daily with breakfast for 1 day, THEN 1 tablet (10 mg total) daily with breakfast for 1 day, THEN 0.5 tablets (5 mg total) daily with breakfast for 1 day., Disp: 10.5 tablet, Rfl: 0 .  vitamin C (ASCORBIC ACID) 500 MG tablet, Take 500 mg by mouth daily., Disp: , Rfl:       Objective:   Vitals:   03/14/21 1427  BP: 124/72  Pulse: 85   SpO2: 96%  Weight: 185 lb 9.6 oz (84.2 kg)  Height: '5\' 7"'  (1.702 m)    Estimated body mass index is 29.07 kg/m as calculated from the following:   Height as of this encounter: '5\' 7"'  (1.702 m).   Weight as of this encounter: 185 lb 9.6 oz (84.2 kg).  '@WEIGHTCHANGE' @  [employer]   03/14/21 1427  Weight: 185 lb 9.6 oz (84.2 kg)     Physical Exam Looks a little bit tired.  Nasal twang.  Normal heart sounds alert and oriented x3 no oral thrush.  Mild postnasal drip present no wheezing no crackles.  Air entry is normal.  Abdomen soft no sinus no clubbing no edema     Assessment:       ICD-10-CM   1. Stage 2 moderate COPD by GOLD classification (HCC) with intolerace (itch) to spiriva  J44.9   2. COPD with acute exacerbation ([place])  J44.1        Plan:     Patient Instructions  Stage 2 moderate COPD by GOLD classification ([place]) with intolerace (itch) to spiriva COPD Exacerbtion  -COPD exacerbatio + likely due to pollen and gardening but covid needs rule out  Plan  - do covid pcr 03/14/2021  - Please take prednisone 40 mg x1 day, then 30 mg x1 day, then 20 mg x1 day,  then 10 mg x1 day, and then 5 mg x1 day and stop - Take doxycycline 11m po twice daily x 5 days; take after meals and avoid sunlight  Followup  - wil call if covid positive  - return in 3 months or sooner if needed  - go to ER if worse        SIGNATURE    Dr. MBrand Males M.D., F.C.C.P,  Pulmonary and Critical Care Medicine Staff Physician, CManuel GarciaDirector - Interstitial Lung Disease  Program  [place] [id number] Pager: [phone] If no answer or between  15:00h - 7:00h: call [phone] Telephone: [phone]  3:08 PM 03/14/2021

## 2021-03-14 NOTE — Patient Instructions (Addendum)
Stage 2 moderate COPD by GOLD classification ([place]) with intolerace (itch) to spiriva COPD Exacerbtion  -COPD exacerbatio + likely due to pollen and gardening but covid needs rule out  Plan  - do covid pcr 03/14/2021  - Please take prednisone 40 mg x1 day, then 30 mg x1 day, then 20 mg x1 day, then 10 mg x1 day, and then 5 mg x1 day and stop - Take doxycycline 100mg  po twice daily x 5 days; take after meals and avoid sunlight  Followup  - wil call if covid positive  - return in 3 months or sooner if needed  - go to ER if worse

## 2021-03-16 DIAGNOSIS — Z20822 Contact with and (suspected) exposure to covid-19: Secondary | ICD-10-CM | POA: Diagnosis not present

## 2021-03-17 NOTE — Telephone Encounter (Signed)
FYI for MR:    Not really a question.  I received the results of my COVID test today and it was negative. The prednisone and doxycycline are working very well and I am feeling much better.

## 2021-03-18 NOTE — Telephone Encounter (Signed)
Great to hear!

## 2021-04-22 ENCOUNTER — Telehealth (INDEPENDENT_AMBULATORY_CARE_PROVIDER_SITE_OTHER): Payer: Medicare Other | Admitting: Internal Medicine

## 2021-04-22 DIAGNOSIS — J449 Chronic obstructive pulmonary disease, unspecified: Secondary | ICD-10-CM

## 2021-04-22 MED ORDER — AZITHROMYCIN 250 MG PO TABS: ORAL_TABLET | ORAL | 0 refills | Status: DC

## 2021-04-22 MED ORDER — PREDNISONE 10 MG PO TABS
ORAL_TABLET | ORAL | 0 refills | Status: AC
Start: 2021-04-22 — End: 2021-04-30

## 2021-04-22 NOTE — Telephone Encounter (Signed)
Primary Pulmonologist: MR Last office visit and with whom: 03/14/21 What do we see them for (pulmonary problems): COPD Last OV assessment/plan: Plan  - do covid pcr 03/14/2021  - Please take prednisone 40 mg x1 day, then 30 mg x1 day, then 20 mg x1 day, then 10 mg x1 day, and then 5 mg x1 day and stop - Take doxycycline 100mg  po twice daily x 5 days; take after meals and avoid sunlight  Was appointment offered to patient (explain)?  Patient is interested in a televisit since she does not have video capabilities with her phone. An epic message was sent to [place] to see if she is ok with doing a televisit instead of video visit.    Reason for call: Called and spoke with patient. She stated that she was seen by MR on 03/14/21 for a copd exacerbation. She was prescribed a prednisone taper and doxycycline. These medications seemed to help and she has been doing well for the past few weeks but noticed that her SOB has returned. She is having to use her albuterol every 4 hours on a schedule. She noticed that her RX states for her to use it every 6 hours but she states it does not last the whole 6 hours. She is still using her Symbicort 80/4.64mcg.   She denied any wheezing, chest pain or fevers. Also denied being around anyone who has been sick recently.   (examples of things to ask: : When did symptoms start? Fever? Cough? Productive? Color to sputum? More sputum than usual? Wheezing? Have you needed increased oxygen? Are you taking your respiratory medications? What over the counter measures have you tried?)  Allergies  Allergen Reactions   Statins Other (See Comments)    Increased LFT's   Spiriva [Tiotropium Bromide Monohydrate] Itching    Immunization History  Administered Date(s) Administered   Influenza Whole 06/27/2012   Influenza, High Dose Seasonal PF 08/14/2017   Influenza,inj,Quad PF,6+ Mos 07/17/2014, 07/23/2015, 07/27/2016   Pneumococcal Conjugate-13 07/17/2014   Pneumococcal-Unspecified  06/27/2012    She is requesting to have another round of prednisone to be sent in for her if she is not able to do the televisit.   [facility] [address]   Dr. Loanne Drilling, can you please advise. Thanks.

## 2021-04-22 NOTE — Telephone Encounter (Signed)
Virtual Visit via Telephone Note  I connected with Kelly Delgado on 04/22/21 at  by telephone and verified that I am speaking with the correct person using two identifiers.  Location: Patient: Home Provider: Home   I discussed the limitations, risks, security and privacy concerns of performing an evaluation and management service by telephone and the availability of in person appointments. I also discussed with the patient that there may be a patient responsible charge related to this service. The patient expressed understanding and agreed to proceed.   History of Present Illness:  Ms. Kelly Delgado is 85 year old female with COPD who presents for acute visit. She is followed by Dr. Chase Caller. She was recently treated for COPD exacerbation in May and treated with prednisone and antibiotics with improvement but not complete resolution. Wheezing had resolved but she had shortness of breath with activity was improved but persistent. She reports that in the last week, she has had wheezing that is worse when laying down and at night. Has unproductive cough. She is compliant with her Symbicort. She uses her albuterol every 6 hours. Denies fevers, chills. She recently travelled. And also recently interacted with potted flowers  I have personally reviewed patient's past medical/family/social history/allergies/current medications.   Observations/Objective: Physical Exam: General: No acute distress Eyes: EOMI, no scleral icterus Respiratory: No expiratory wheezing on phone. Neuro: AAO x4  Assessment and Plan:  COPD exacerbation --START prednisone taper as instructed --START azithromycin x 5 days --Avoid pollen and other allergens --Recommend home covid test. Please update [place] if positive  Follow Up Instructions: Follow-up as scheduled with Dr. Chase Caller   I discussed the assessment and treatment plan with the patient. The patient was provided an opportunity to ask questions and all were  answered. The patient agreed with the plan and demonstrated an understanding of the instructions.   The patient was advised to call back or seek an in-person evaluation if the symptoms worsen or if the condition fails to improve as anticipated.  I provided 25 minutes of non-face-to-face time during this encounter.   Tywaun Hiltner Rodman Pickle, MD

## 2021-05-27 ENCOUNTER — Telehealth: Payer: Self-pay | Admitting: Internal Medicine

## 2021-05-27 MED ORDER — PREDNISONE 10 MG PO TABS: ORAL_TABLET | ORAL | 0 refills | Status: AC

## 2021-05-27 MED ORDER — AZITHROMYCIN 250 MG PO TABS
ORAL_TABLET | ORAL | 0 refills | Status: DC
Start: 2021-05-27 — End: 2021-07-17

## 2021-05-27 MED ORDER — ALBUTEROL SULFATE (2.5 MG/3ML) 0.083% IN NEBU: INHALATION_SOLUTION | RESPIRATORY_TRACT | 6 refills | Status: DC

## 2021-05-27 NOTE — Telephone Encounter (Signed)
Called and spoke with patient. She verbalized understanding. She will let us know the results of the at home covid test.   Will go ahead and send in the zpak and prednisone.   Nothing further needed at time of call.

## 2021-05-27 NOTE — Telephone Encounter (Signed)
Yes looks like another aecopd again  Zpak and Please take prednisone 40 mg x1 day, then 30 mg x1 day, then 20 mg x1 day, then 10 mg x1 day, and then 5 mg x1 day and stop  Also do covid antigen test at home

## 2021-05-27 NOTE — Telephone Encounter (Signed)
Called and spoke with patient. She stated that she has been coughing and wheezing more for the past 5 days. Her cough has been productive with clear phlegm. She has been more SOB as well. She denied any fevers or body aches.   She is still using her Symbicort 71mg inhaler as well as the albuterol. HFA. She has been using the albuterol HFA every 4 hours but it is not helping. She still has her nebulizer but does not have the medication to use. The medication she has expired. I have sent in a refill for her.   She was last prescribed a zpak and prednisone taper by Dr. ELoanne Drillingback in [place]. She stated that this worked well for her but she is unsure if she needs the same treatment again.   [facility] [address]   MR, can you please advise? Thanks!

## 2021-06-17 ENCOUNTER — Telehealth: Payer: Self-pay | Admitting: Internal Medicine

## 2021-06-17 DIAGNOSIS — R0602 Shortness of breath: Secondary | ICD-10-CM

## 2021-06-17 MED ORDER — PREDNISONE 10 MG (21) PO TBPK: ORAL_TABLET | ORAL | 0 refills | Status: DC

## 2021-06-17 NOTE — Telephone Encounter (Signed)
Spoke with the pt  She is c/o increased SOB, wheezing, dry cough over the past 2 wks  She denies any HA, f/c/s, aches  She is on her symbicort and is using her albuterol neb every 6 hours  She states neb seems to help, but only for about 3 hours  She states these symptoms have been bothering her off and on since May 2022  She has not been vaccinated against covid She took at home test a wk ago and it was neg and she says she has not been out of her house since then  She is asking for 10 day course of pred She is scheduled with MR for 07/22/21  No openings this wk with anyone unfortunately  Please advise thanks!  Allergies  Allergen Reactions   Statins Other (See Comments)    Increased LFT's   Spiriva [Tiotropium Bromide Monohydrate] Itching

## 2021-06-17 NOTE — Telephone Encounter (Signed)
   Take prednisone 40 mg daily x 2 days, then '30mg'$  daily x 2 days, then  '20mg'$  daily x 2 days, then '10mg'$  daily x 2 days, then '5mg'$  daily x 2 days and stop  Given face she is felling poorly for a while - HRCT chest without contrast (dyspnea) prior to clinic visit

## 2021-06-17 NOTE — Telephone Encounter (Signed)
Lm x1 for patient.  

## 2021-06-17 NOTE — Telephone Encounter (Signed)
Patient is aware of recommendations and voiced her understanding.  Rx for prednisone has been sent to preferred pharmacy.  CTHR has been ordered. Nothing further needed at this time.

## 2021-06-27 ENCOUNTER — Ambulatory Visit (INDEPENDENT_AMBULATORY_CARE_PROVIDER_SITE_OTHER)
Admission: RE | Admit: 2021-06-27 | Discharge: 2021-06-27 | Disposition: A | Discharge status: Home / Self Care | Payer: Medicare Other | Source: Ambulatory Visit | Attending: Internal Medicine | Admitting: Internal Medicine

## 2021-06-27 ENCOUNTER — Other Ambulatory Visit: Payer: Self-pay

## 2021-06-27 DIAGNOSIS — R0602 Shortness of breath: Secondary | ICD-10-CM | POA: Diagnosis not present

## 2021-06-27 DIAGNOSIS — I700 Atherosclerosis of aorta: Secondary | ICD-10-CM | POA: Diagnosis not present

## 2021-06-27 DIAGNOSIS — J849 Interstitial pulmonary disease, unspecified: Secondary | ICD-10-CM | POA: Diagnosis not present

## 2021-07-02 ENCOUNTER — Other Ambulatory Visit: Payer: Self-pay | Admitting: Internal Medicine

## 2021-07-07 ENCOUNTER — Other Ambulatory Visit: Payer: Self-pay | Admitting: Internal Medicine

## 2021-07-07 NOTE — Telephone Encounter (Signed)
/  Went to refill this medication and the insurance will not cover the symbicort 80 but will cover the following:  Symbicort 160 Advair HFA 45,115,230 Breo 100 and 200  Pt last seen:03/14/21 Next ov: 07/17/21   MR please advise if you want to change to one of the approved meds above.  Thanks

## 2021-07-11 ENCOUNTER — Telehealth: Payer: Self-pay | Admitting: Internal Medicine

## 2021-07-11 NOTE — Telephone Encounter (Signed)
Called and spoke with Patient.  Patient requested Symbicort refill to be sent to [facility].  Requested prescription sent to pharmacy.  Nothing further at this time.

## 2021-07-17 ENCOUNTER — Ambulatory Visit: Payer: Medicare Other | Admitting: Internal Medicine

## 2021-07-17 ENCOUNTER — Other Ambulatory Visit: Payer: Self-pay

## 2021-07-17 ENCOUNTER — Encounter: Payer: Self-pay | Admitting: Internal Medicine

## 2021-07-17 VITALS — BP 122/66 | HR 85 | Temp 97.9°F | Ht 67.0 in | Wt 179.4 lb

## 2021-07-17 DIAGNOSIS — J449 Chronic obstructive pulmonary disease, unspecified: Secondary | ICD-10-CM

## 2021-07-17 DIAGNOSIS — J441 Chronic obstructive pulmonary disease with (acute) exacerbation: Secondary | ICD-10-CM | POA: Diagnosis not present

## 2021-07-17 DIAGNOSIS — Z8616 Personal history of COVID-19: Secondary | ICD-10-CM | POA: Diagnosis not present

## 2021-07-17 MED ORDER — PREDNISONE 10 MG PO TABS: ORAL_TABLET | ORAL | 0 refills | Status: AC

## 2021-07-17 NOTE — Patient Instructions (Addendum)
ICD-10-CM   1. COPD with acute exacerbation ([place])  J44.1     2. Stage 2 moderate COPD by GOLD classification ([place]) with intolerace (itch) to spiriva  J44.9     3. History of 2019 novel coronavirus disease (COVID-19)  Z86.16       YOu presenting had COVID around Labor Day and since then improved although you are having some symptoms of COPD exacerbation  Also of note your September 2022 CT scan of the chest other than emphysema is showing suggestion of early onset of what is called interstitial lung disease or pulmonary fibrosis.  The radiologist not sure but it is a suspicion at this point  Plan - Continue Symbicort as before with albuterol as needed -Please take prednisone 40 mg x1 day, then 30 mg x1 day, then 20 mg x1 day, then 10 mg x1 day, and then 5 mg x1 day and stop -In 4-8 weeks do full pulmonary function test (last 1 was in 2016 and you need this to help [place] differentiate between emphysema versus early onset of interstitial lung disease)  Follow-up - Return to see Dr. Chase Caller in 4-8 weeks but after pulmonary function test  -We will do simple walking desaturation test at the time of follow-up

## 2021-07-17 NOTE — Progress Notes (Signed)
OV 07/23/2015  Chief Complaint  Patient presents with   Follow-up    Pt here to discuss CT results and PFT results. Pt states breathing is better since last visit. She is now walking 2 miles three times a week     Follow-up Gold stage II COPD: Currently only on Symbicort. She stopped Spiriva due to itching. She also stopped [place] in past due to cost issues. With Symbicort she is doing really well. She is walking 2 miles a week. Her effort tolerance is improved. She is hardly ever symptomatic. FEV1 today 1.85 L/76% and a ratio of 74 showing very mild obstruction. There are no new issues. She will have a flu shot today. We discussed switching to a long-acting anticholinergic associated with long-acting beta agonist and skipping inhaled steroidal given recent history of hip fracture but she's not interested to change due to cost issues and because she's currently doing well  Multiple lung nodules: She had CT scan of the chest 08/03/2015: She has multiple lung nodules largest 4 mm. This is all stable since October 2014 completing near 2 year stability. She is currently 85 years of age and therefore will not qualify for future low-dose lung cancer screening CT   OV 04/30/2016  Chief Complaint  Patient presents with   Follow-up    breathing has been doing well.  no concerns.CAT Score: 11    Follow-up moderate COPD he is a 9 month routine follow-up. She is doing well. No urgent care visits. No emergency room visits. No prednisone use. No new medical problems diagnosis. [place] and now she has a scratchy throat but several weeks ago she had a cold and did not flareup in the COPD exacerbation. She wants to keep an eye on her cold. She's not interested in inhaler change. She likes her Symbicort. She has enough refills and will call us if needed. She's got good quality of life.   OV 02/09/2017  Chief Complaint  Patient presents with   Follow-up    Pt states she  feels the Symbicort 160 helps better than the 80, she is currently on the 80 dose. Pt c/o throat tickle, hoarseness, and SOB with over activity.  Pt denies chest congestion, CP/tightness.     Follow-up moderate COPD  Since last visit I dropped her Symbicort to the lower dose. She's had 2 exacerbations since then. She also believes that the lower dose Symbicort is not controlling her COPD as well but she's not fully sure if it is a steroid effect on just an individual variation depending on the season. In December she does not want to go up on the steroid dose to a full dose Symbicort. In the past. Event caused itching so this time she wants to hold off on any anticholinergic but if she continues to feel that lower dose Symbicort is not controlling a COPD well then she'll be open to adding a different anticholinergic other than Spiriva going up on the steroid dose. Otherwise doing well. The spring season though is causing some postnasal drainage and increased cough and ticklish throat.   OV 08/14/2017  Chief Complaint  Patient presents with   Follow-up    Pt states that she has been doing good. Pt is currently on presnisone due to arthritis but has two days left of that. Was a little SOB but the prednisone she is currently on is helping with that.   Follow-up moderate COPD  Last seen April 2018. Normally does 9 month follow-up. But at last visit. Drop the steroid dose and therefore she is here in 6 months. COPD stable no exacerbations. Recently shehad neck spasm and is currently finishing a prednisone taper given by orthopedics and this is actually helping his COPD symptoms. COPD cat score is 7 and only shows mild symptom burden. She wants flu shot. No new issues. Chest x-ray September 2018 per report is clear   OV 05/18/2018  Chief Complaint  Patient presents with   Follow-up    Pt states breathing has been worse with the humidity and hot weather. States she has been having to use rescue  inhaler at least twice a day. Pt also states she has been a little more SOB recently, has an occ. cough with clear mucus, but denies any CP.    Follow-up moderate COPD  This is a routine follow-up. She normally does 9 month follow-ups. Last visit October 2018 periods in the interim she's continued to do well. COPD cat score is 6 and slightly better/same compared to the before. She does daily exercises at [facility] which helps. Recently with humidity she is dealing with some increased postnasal drip for which she takes nasal steroids as needed. In the interim has been no medical issues are ER visits or urgent care visits or prednisone use or new medical diagnoses. Last chest x-ray September 2018 clear      OV 06/14/2020  Subjective:  Patient ID: Kelly Delgado, female , DOB: 1936/07/03 , age 64 y.o. , MRN: [id number] , AD[address]: [address] [place] [id number]   06/14/2020 -   Chief Complaint  Patient presents with   Follow-up    Productive cough with clear phlegm   Follow-up moderate COPD.  HPI Kelly Delgado 84 y.o. -last seen by myself just over 2 years ago in July 2019.  Then in January 2020 she had a COPD exacerbation and seen by nurse practitioner.  Then after that the COVID-19 pandemic struck.  Since then she has been isolating.  We have not been able to see her till today.  She says she is doing well.  She is on Symbicort.  She wants refills.  COPD CAT score is stable.  No interim medical issues no ER visits.  She normally gets vaccines.  However she is reluctant to get the Covid vaccine because of lack of long-term side effects.  She recalls the thalidomide disaster that happened when it was 5 years into the drug and people discovered the side effects.  I acknowledge that we do not have long-term side effect profile on Covid vaccine because of the short duration of the pandemic.  She is really good at social distancing and masking.  We discussed Regeneron monoclonal antibodies as a  way to mitigate her risk in terms of both prophylaxis and treating infection.       OV 03/14/2021  Subjective:  Patient ID: Kelly Delgado, female , DOB: 05-Aug-1936 , age 85 y.o. , MRN: [id number] , [address]: [address] [place] [id number] PCP Marda Stalker, PA-C Patient Care Team: Marda Stalker, PA-C as PCP - General (Family Medicine) Brand Males, MD as Consulting Physician (Pulmonary Disease)  This Provider for this visit: Treatment Team:  Attending Provider: Brand Males, MD    03/14/2021 -   Chief Complaint  Patient presents with   Follow-up    Pt states she was doing okay since last visit until about 1 week ago due to having problems from  pollen and states she was having more problems with SOB and also states that she has been coughing which is also keeping her up at night.     HPI Shanessa Hunsberger 84 y.o. -COPD follow-up.  In this visit she says she continues to use her Symbicort.  She brings her husband with her.  She normally exercises at the [facility].  She is not vaccinated against COVID and will not get vaccinated.  She says that she is not coming to close contact with anyone.  No clusters.  Other than when she exercises she is masking.  She avoids people.  However she got exposed to pollen late last week.  Then approximately on Friday, Mar 08, 2021 she worked in the garden with some hanging buds.  Then starting Sunday, Mar 10, 2021 she started getting cough with yellow sputum.  She sounds congested which she says is no sinus drainage.  She feels tired she is more short of breath.  There might be some associated increase in wheezing.  No fever.  The same day she checked her COVID rapid antigen test at home and was negative.  Husband is not sick.  She is not vaccinated against COVID no other sick contacts.  She does not think it is COVID but is willing to get tested with PCR.     CAT COPD Symptom & Quality of Life Score (GSK trademark) 0 is no burden. 5 is  highest burden 05/18/2018   Never Cough -> Cough all the time 2  No phlegm in chest -> Chest is full of phlegm 0  No chest tightness -> Chest feels very tight 0  No dyspnea for 1 flight stairs/hill -> Very dyspneic for 1 flight of stairs 3  No limitations for ADL at home -> Very limited with ADL at home 0  Confident leaving home -> Not at all confident leaving home 0  Sleep soundly -> Do not sleep soundly because of lung condition 0  Lots of Energy -> No energy at all 1  TOTAL Score (max 40)  6    CAT Score 06/14/2020 08/14/2017 04/30/2016  Total CAT Score 11 7 11      OV 07/17/2021  Subjective:  Patient ID: Kelly Delgado, female , DOB: 11/04/1935 , age 56 y.o. , MRN: [id number] , ADDRESS: [place] [id number] PCP Marda Stalker, PA-C Patient Care Team: Marda Stalker, PA-C as PCP - General (Family Medicine) Brand Males, MD as Consulting Physician (Pulmonary Disease)  This Provider for this visit: Treatment Team:  Attending Provider: Brand Males, MD    07/17/2021 -   Chief Complaint  Patient presents with   Follow-up    Pt states she had covid about 2 weeks ago. States she is tired all the time and feels like she cannot get her strength back. States she has been coughing some and also has had some increased SOB.   Gold stage II COPD on Symbicort   HPI Tangi Miyoshi 85 y.o. -she has Gold stage II COPD on Symbicort.  Last visit May 2022.  Had refused COVID-vaccine.  After that couple of for telephone calls for COPD exacerbation treated with prednisone.  Most recently towards the end of August 2022.  Then on 06/27/2021 we did high-resolution CT chest.  A suggestion of early ILD.  Then several days later around Labor Day 2022 developed COVID .  She did not call us for any antiviral.  She rested and then missed her daughter's wedding and then subsequently  got better.  Currently having some residual cough fatigue and shortness of breath and occasional  white sputum.  Things are getting better but she feels prednisone could help her.  Today the first day out of the house.  No leg swelling.  No hemoptysis.  No fever or chills.   CAT Score 06/14/2020 08/14/2017 04/30/2016  Total CAT Score 11 7 11        PFT  PFT Results Latest Ref Rng & Units 07/23/2015  FVC-Pre L 2.51  FVC-Predicted Pre % 77  FVC-Post L 2.55  FVC-Predicted Post % 79  Pre FEV1/FVC % % 74  Post FEV1/FCV % % 76  FEV1-Pre L 1.85  FEV1-Predicted Pre % 76  FEV1-Post L 1.93    CLINICAL DATA:  85 year old female with history of dyspnea on exertion. Increasing shortness of breath since May 2022. Currently on fourth round of prednisone treatment. History of COPD. Evaluate for interstitial lung disease.   EXAM: CT CHEST WITHOUT CONTRAST   TECHNIQUE: Multidetector CT imaging of the chest was performed following the standard protocol without intravenous contrast. High resolution imaging of the lungs, as well as inspiratory and expiratory imaging, was performed.   COMPARISON:  Chest CT 07/04/2015.   FINDINGS: Cardiovascular: Heart size is borderline enlarged. There is no significant pericardial fluid, thickening or pericardial calcification. Aortic atherosclerosis. No definite coronary artery calcifications. Mild calcifications of the mitral annulus.   Mediastinum/Nodes: No pathologically enlarged mediastinal or hilar lymph nodes. Please note that accurate exclusion of hilar adenopathy is limited on noncontrast CT scans. Esophagus is unremarkable in appearance. No axillary lymphadenopathy.   Lungs/Pleura: High-resolution images demonstrate areas of mild septal thickening and severe thickening of the peribronchovascular interstitium with some linear areas of architectural distortion and volume loss, most evident in the right middle lobe and inferior segment of the lingula. No significant regions of traction bronchiectasis or honeycombing. Inspiratory and  expiratory imaging demonstrates moderate air trapping indicative of small airways disease. No acute consolidative airspace disease. No pleural effusions. Small calcified granulomas are present. No other suspicious appearing pulmonary nodules or masses are noted.   Upper Abdomen: Aortic atherosclerosis.   Musculoskeletal: There are no aggressive appearing lytic or blastic lesions noted in the visualized portions of the skeleton.   IMPRESSION: 1. The appearance of the lungs may suggest very early or mild interstitial lung disease, with a spectrum of findings considered indeterminate for usual interstitial pneumonia (UIP) per current ATS guidelines, as detailed above. If there is persistent clinical concern for interstitial lung disease, repeat high-resolution chest CT would be recommended in 12 months to assess for temporal changes in the appearance of the lung parenchyma. 2. Aortic atherosclerosis.   Aortic Atherosclerosis (ICD10-I70.0).     Electronically Signed   By: Vinnie Langton M.D.   On: 06/27/2021 15:40     has a past medical history of Asthmatic bronchitis, Basal cell carcinoma of breast (1990's?), GERD (gastroesophageal reflux disease), H/O hiatal hernia, Hypertension, Migraines, Schatzki's ring, and Shortness of breath.   reports that she quit smoking about 52 years ago. Her smoking use included cigarettes. She has a 4.29 pack-year smoking history. She has never used smokeless tobacco.  Past Surgical History:  Procedure Laterality Date   ABDOMINAL HYSTERECTOMY  1980's   CATARACT EXTRACTION W/ INTRAOCULAR LENS  IMPLANT, BILATERAL Bilateral 1990's   DILATION AND CURETTAGE OF UTERUS  1980's   "1" (06/07/2013)   ESOPHAGEAL DILATION  2012   "just once" (06/07/2013)   HIP PINNING,CANNULATED Right 10/09/2014   Procedure:  CANNULATED HIP PINNING;  Surgeon: Renette Butters, MD;  Location: [facility];  Service: Orthopedics;  Laterality: Right;   URETHRAL DIVERTICULUM REPAIR   1970's    Allergies  Allergen Reactions   Statins Other (See Comments)    Increased LFT's   Spiriva [Tiotropium Bromide Monohydrate] Itching    Immunization History  Administered Date(s) Administered   Influenza Whole 06/27/2012   Influenza, High Dose Seasonal PF 08/14/2017   Influenza,inj,Quad PF,6+ Mos 07/17/2014, 07/23/2015, 07/27/2016   Pneumococcal Conjugate-13 07/17/2014   Pneumococcal-Unspecified 06/27/2012    Family History  Problem Relation Age of Onset   Cancer Mother    Stroke Mother    Hypertension Mother      Current Outpatient Medications:    albuterol (PROVENTIL) (2.5 MG/3ML) 0.083% nebulizer solution, Take 16mL by nebulization every 6 hours as needed for wheezing or SOB, Disp: 75 mL, Rfl: 6   albuterol (VENTOLIN HFA) 108 (90 Base) MCG/ACT inhaler, INHALE 2 PUFFS INTO LUNGS EVERY 6 HRS AS NEEDED FOR WHEEZING/SHORTNESS OF BREATH *NEEDS APPOINTMENT, Disp: 18 each, Rfl: 3   budesonide-formoterol (SYMBICORT) 80-4.5 MCG/ACT inhaler, INHALE 2 PUFFS BY MOUTH INTO THE LUNGS TWICE DAILY, Disp: 10.2 each, Rfl: 9   Calcium Carb-Cholecalciferol (CALCIUM 600 + D PO), Take 1 tablet by mouth every other day. , Disp: , Rfl:    Cholecalciferol (VITAMIN D-3 PO), Take 2,000 Units by mouth daily. , Disp: , Rfl:    hydrochlorothiazide (HYDRODIURIL) 25 MG tablet, Take 25 mg by mouth daily as needed (for swelling). , Disp: , Rfl:    predniSONE (DELTASONE) 10 MG tablet, Take 4 tablets (40 mg total) by mouth daily with breakfast for 1 day, THEN 3 tablets (30 mg total) daily with breakfast for 1 day, THEN 2 tablets (20 mg total) daily with breakfast for 1 day, THEN 1 tablet (10 mg total) daily with breakfast for 1 day, THEN 0.5 tablets (5 mg total) daily with breakfast for 1 day., Disp: 10.5 tablet, Rfl: 0   vitamin C (ASCORBIC ACID) 500 MG tablet, Take 500 mg by mouth daily., Disp: , Rfl:       Objective:   Vitals:   07/17/21 1534  BP: 122/66  Pulse: 85  Temp: 97.9 F (36.6 C)   TempSrc: Oral  SpO2: 98%  Weight: 179 lb 6.4 oz (81.4 kg)  Height: 5\' 7"  (1.702 m)    Estimated body mass index is 28.1 kg/m as calculated from the following:   Height as of this encounter: 5\' 7"  (1.702 m).   Weight as of this encounter: 179 lb 6.4 oz (81.4 kg).  @WEIGHTCHANGE @  [employer]   07/17/21 1534  Weight: 179 lb 6.4 oz (81.4 kg)     Physical Exam  General: No distress. Looks well and same Neuro: Alert and Oriented x 3. GCS 15. Speech normal Psych: Pleasant Resp:  Barrel Chest - nno.  Wheeze - no, Crackles - ?, No overt respiratory distress CVS: Normal heart sounds. Murmurs - no Ext: Stigmata of Connective Tissue Disease - no HEENT: Normal upper airway. PEERL +. No post nasal drip        Assessment:       ICD-10-CM   1. COPD with acute exacerbation (HCC)  J44.1 Pulmonary function test    2. Stage 2 moderate COPD by GOLD classification ([place]) with intolerace (itch) to spiriva  J44.9 Pulmonary function test    3. History of 2019 novel coronavirus disease (COVID-19)  Z86.16  Plan:     Patient Instructions     ICD-10-CM   1. COPD with acute exacerbation ([place])  J44.1     2. Stage 2 moderate COPD by GOLD classification ([place]) with intolerace (itch) to spiriva  J44.9     3. History of 2019 novel coronavirus disease (COVID-19)  Z86.16       YOu  had COVID around Labor Day and since then improved although you are having some symptoms of COPD exacerbation  Also of note your September 2022 CT scan of the chest other than emphysema is showing suggestion of early onset of what is called interstitial lung disease or pulmonary fibrosis.  The radiologist not sure but it is a suspicion at this point  Plan - Continue Symbicort as before with albuterol as needed -Please take prednisone 40 mg x1 day, then 30 mg x1 day, then 20 mg x1 day, then 10 mg x1 day, and then 5 mg x1 day and stop -In 4-8 weeks do full pulmonary function test (last 1 was in 2016 and  you need this to help [place] differentiate between emphysema versus early onset of interstitial lung disease)  Follow-up - Return to see Dr. Chase Caller in 4-8 weeks but after pulmonary function test  -We will do simple walking desaturation test at the time of follow-up    SIGNATURE    Dr. Brand Males, M.D., F.C.C.P,  Pulmonary and Critical Care Medicine Staff Physician, [place] Director - Interstitial Lung Disease  Program  Pulmonary [place] [facility] [place], [place], [address]  Pager: [phone], If no answer or between  15:00h - 7:00h: call [phone] Telephone: [phone]  4:12 PM 07/17/2021

## 2021-07-26 ENCOUNTER — Other Ambulatory Visit: Payer: Self-pay | Admitting: Internal Medicine

## 2021-07-29 ENCOUNTER — Telehealth: Payer: Self-pay | Admitting: Internal Medicine

## 2021-07-29 NOTE — Telephone Encounter (Signed)
Called and spoke with Patient.  Patient stated she was covid positive 07/02/21, but has recovered.  Patient was scheduled PFT 07/31/21, but PFT has been cancelled, because Patient tested positive for Covid at her preprocedure test. Patient is scheduled OV 08/30/21, with Dr. Chase Caller.  Patient asked if OV needs to be rescheduled until after PFT is completed or does Dr. Chase Caller want her to come in for scheduled OV anyway?   Message routed to Dr. Chase Caller to advise

## 2021-07-30 LAB — SARS CORONAVIRUS 2 (TAT 6-24 HRS): SARS Coronavirus 2: POSITIVE — AB

## 2021-07-30 NOTE — Telephone Encounter (Signed)
Are we still doing the 90 day hold on doing PFT after covid? I thought I reduced to 14 days?

## 2021-07-31 NOTE — Telephone Encounter (Signed)
She can have her PFT done 21 days after testing positive as long as she is not actively have symptoms the day of her PFT.

## 2021-08-01 NOTE — Telephone Encounter (Signed)
Spoke with the pt and scheduled appt with MR and PFT same day 09/12/21

## 2021-08-01 NOTE — Telephone Encounter (Signed)
Ideally get PFTs at time of next visit as long as is 21d from the covid

## 2021-08-05 ENCOUNTER — Telehealth: Payer: Self-pay | Admitting: Internal Medicine

## 2021-08-05 DIAGNOSIS — J849 Interstitial pulmonary disease, unspecified: Secondary | ICD-10-CM

## 2021-08-05 MED ORDER — PREDNISONE 10 MG PO TABS: ORAL_TABLET | ORAL | 0 refills | Status: AC

## 2021-08-05 NOTE — Telephone Encounter (Signed)
  All of a sudden things are changed for her and she is having recurrent exacerbation.  The CT scan in September 2022 suggesting early/possible ILD  Plan  - ok for Take prednisone 40 mg daily x 2 days, then 20mg  daily x 2 days, then 10mg  daily x 2 days, then 5mg  daily x 2 days and stop  - do blood work cbc wih diff, IgE, ANA, RF, CCP, ssa/ssb, scl-70, Quantiferon gold for ILD workup  -  can she do PFT 11/4 /22 before sseeing me ?Marland Kitchen She should be 21 days by 08/15/21   Thanks  MR xxx  xxxxxx IMPRESSION: 1. The appearance of the lungs may suggest very early or mild interstitial lung disease, with a spectrum of findings considered indeterminate for usual interstitial pneumonia (UIP) per current ATS guidelines, as detailed above. If there is persistent clinical concern for interstitial lung disease, repeat high-resolution chest CT would be recommended in 12 months to assess for temporal changes in the appearance of the lung parenchyma. 2. Aortic atherosclerosis.   Aortic Atherosclerosis (ICD10-I70.0).     Electronically Signed   By: Vinnie Langton M.D.   On: 06/27/2021 15:40     cholesterol

## 2021-08-05 NOTE — Telephone Encounter (Signed)
Called and spoke with patient. She verbalized understanding and will come in for lab work. Order have been placed. She is already scheduled for a PFT next month before the appt on 11/17. She wants to keep the appt on the 4th as well.   Prednisone has been sent in for her.   Nothing further needed at time of call.

## 2021-08-05 NOTE — Telephone Encounter (Signed)
I have called the pt and she stated that she finished the prednisone on 07/29/21 and she started back wheezing on 08/01/2021.  She stated that she does better when she is given the 10 day taper as this seems to last a little longer.  She stated that she is only able to sleep about 4 hours and then she is up due to wheezing and this makes her have to sit on the edge of the bed.  She stated that she is worse now than before.  She did recently test positive for covid--this is why her PFT was rescheduled.    She still has the appt scheduled for 11/4 but did not know if she needing to keep this appt or not.  She is currently scheduled for 11/17 for OV and PFT.  MR please advise. Thanks

## 2021-08-06 ENCOUNTER — Other Ambulatory Visit (INDEPENDENT_AMBULATORY_CARE_PROVIDER_SITE_OTHER): Payer: Medicare Other

## 2021-08-06 DIAGNOSIS — J849 Interstitial pulmonary disease, unspecified: Secondary | ICD-10-CM

## 2021-08-06 LAB — CBC WITH DIFFERENTIAL/PLATELET
Basophils Absolute: 0.1 10*3/uL (ref 0.0–0.1)
Basophils Relative: 0.8 % (ref 0.0–3.0)
Eosinophils Absolute: 1.2 10*3/uL — ABNORMAL HIGH (ref 0.0–0.7)
Eosinophils Relative: 17.2 % — ABNORMAL HIGH (ref 0.0–5.0)
HCT: 41.1 % (ref 36.0–46.0)
Hemoglobin: 13.6 g/dL (ref 12.0–15.0)
Lymphocytes Relative: 25.5 % (ref 12.0–46.0)
Lymphs Abs: 1.7 10*3/uL (ref 0.7–4.0)
MCHC: 33.1 g/dL (ref 30.0–36.0)
MCV: 89.1 fl (ref 78.0–100.0)
Monocytes Absolute: 0.3 10*3/uL (ref 0.1–1.0)
Monocytes Relative: 5.2 % (ref 3.0–12.0)
Neutro Abs: 3.5 10*3/uL (ref 1.4–7.7)
Neutrophils Relative %: 51.3 % (ref 43.0–77.0)
Platelets: 166 10*3/uL (ref 150.0–400.0)
RBC: 4.6 Mil/uL (ref 3.87–5.11)
RDW: 14.9 % (ref 11.5–15.5)
WBC: 6.7 10*3/uL (ref 4.0–10.5)

## 2021-08-09 DIAGNOSIS — E785 Hyperlipidemia, unspecified: Secondary | ICD-10-CM | POA: Diagnosis not present

## 2021-08-09 DIAGNOSIS — Z0000 Encounter for general adult medical examination without abnormal findings: Secondary | ICD-10-CM | POA: Diagnosis not present

## 2021-08-09 DIAGNOSIS — K7689 Other specified diseases of liver: Secondary | ICD-10-CM | POA: Diagnosis not present

## 2021-08-09 DIAGNOSIS — I10 Essential (primary) hypertension: Secondary | ICD-10-CM | POA: Diagnosis not present

## 2021-08-09 DIAGNOSIS — M810 Age-related osteoporosis without current pathological fracture: Secondary | ICD-10-CM | POA: Diagnosis not present

## 2021-08-09 LAB — SJOGREN'S SYNDROME ANTIBODS(SSA + SSB)
SSA (Ro) (ENA) Antibody, IgG: <1 AI
SSB (La) (ENA) Antibody, IgG: <1 AI

## 2021-08-09 LAB — ANA: Anti Nuclear Antibody (ANA): NEGATIVE

## 2021-08-09 LAB — CYCLIC CITRUL PEPTIDE ANTIBODY, IGG: Cyclic Citrullin Peptide Ab: <16 UNITS

## 2021-08-09 LAB — RHEUMATOID FACTOR: Rheumatoid fact SerPl-aCnc: 47 IU/mL — ABNORMAL HIGH (ref <14)

## 2021-08-09 LAB — IGE: IgE (Immunoglobulin E), Serum: 49 kU/L (ref <=114)

## 2021-08-10 LAB — QUANTIFERON-TB GOLD PLUS
Mitogen-NIL: >10 IU/mL
NIL: 0.3 IU/mL
QuantiFERON-TB Gold Plus: NEGATIVE
TB1-NIL: <0 IU/mL
TB2-NIL: <0 IU/mL

## 2021-08-14 ENCOUNTER — Other Ambulatory Visit: Payer: Self-pay | Admitting: Family Medicine

## 2021-08-14 DIAGNOSIS — M810 Age-related osteoporosis without current pathological fracture: Secondary | ICD-10-CM

## 2021-08-29 ENCOUNTER — Other Ambulatory Visit: Payer: Self-pay | Admitting: Family Medicine

## 2021-08-29 DIAGNOSIS — Z1231 Encounter for screening mammogram for malignant neoplasm of breast: Secondary | ICD-10-CM

## 2021-08-30 ENCOUNTER — Other Ambulatory Visit: Payer: Self-pay

## 2021-08-30 ENCOUNTER — Encounter: Payer: Self-pay | Admitting: Internal Medicine

## 2021-08-30 ENCOUNTER — Ambulatory Visit: Payer: Medicare Other | Admitting: Internal Medicine

## 2021-08-30 VITALS — BP 142/100 | HR 80 | Temp 98.0°F | Ht 67.5 in | Wt 179.2 lb

## 2021-08-30 DIAGNOSIS — J441 Chronic obstructive pulmonary disease with (acute) exacerbation: Secondary | ICD-10-CM

## 2021-08-30 DIAGNOSIS — J449 Chronic obstructive pulmonary disease, unspecified: Secondary | ICD-10-CM

## 2021-08-30 DIAGNOSIS — Z8616 Personal history of COVID-19: Secondary | ICD-10-CM | POA: Diagnosis not present

## 2021-08-30 DIAGNOSIS — I10 Essential (primary) hypertension: Secondary | ICD-10-CM | POA: Diagnosis not present

## 2021-08-30 DIAGNOSIS — R768 Other specified abnormal immunological findings in serum: Secondary | ICD-10-CM

## 2021-08-30 DIAGNOSIS — J849 Interstitial pulmonary disease, unspecified: Secondary | ICD-10-CM | POA: Diagnosis not present

## 2021-08-30 DIAGNOSIS — R7689 Other specified abnormal immunological findings in serum: Secondary | ICD-10-CM

## 2021-08-30 DIAGNOSIS — D721 Eosinophilia, unspecified: Secondary | ICD-10-CM

## 2021-08-30 DIAGNOSIS — R062 Wheezing: Secondary | ICD-10-CM | POA: Diagnosis not present

## 2021-08-30 DIAGNOSIS — R0600 Dyspnea, unspecified: Secondary | ICD-10-CM | POA: Diagnosis not present

## 2021-08-30 DIAGNOSIS — R059 Cough, unspecified: Secondary | ICD-10-CM | POA: Diagnosis not present

## 2021-08-30 MED ORDER — PREDNISONE 10 MG PO TABS: ORAL_TABLET | ORAL | 0 refills | Status: AC

## 2021-08-30 MED ORDER — BREZTRI AEROSPHERE 160-9-4.8 MCG/ACT IN AERO: 2.0000 | INHALATION_SPRAY | Freq: Two times a day (BID) | RESPIRATORY_TRACT | 0 refills | Status: DC

## 2021-08-30 NOTE — Addendum Note (Signed)
Addended by: Elby Beck R on: 08/30/2021 06:42 PM   Modules accepted: Orders

## 2021-08-30 NOTE — Patient Instructions (Addendum)
    ICD-10-CM   1. COPD with acute exacerbation ([place])  J44.1     2. COPD, frequent exacerbations ([place])  J44.1     3. Stage 2 moderate COPD by GOLD classification ([place]) with intolerace (itch) to spiriva  J44.9     4. ILD (interstitial lung disease) ([place])  J84.9     5. History of 2019 novel coronavirus disease (COVID-19)  Z86.16     6. Rheumatoid factor positive  R76.8     7. Eosinophilia, unspecified type  D72.10       YOu have another flare up  - could be eztemibe - CMA to mark it as allergy  - but you also have high blood eosinophils suggesting you are having some allergic phenoptyoe  Your rheumatoid factor OCt 2022 is borderline high    - unclear significance   Also of note your September 2022 CT scan of the chest other than emphysema is showing suggestion of early onset of what is called interstitial lung disease or pulmonary fibrosis.  The radiologist not sure but it is a suspicion at this point   Plan  - do RAST allergy blood test 08/30/2021 or 2 weeks after last dose of steroids  - Take prednisone 40 mg daily x 2 days, then 20mg  daily x 2 days, then 10mg  daily x 2 days, then 5mg  daily x 2 days and stop - Change symbicor to BREZTRI 2 puff twice daily  - further step up threapies include consideration of roflumilast or biologics against eosinophils or clinical trials - mid Novemeber keep up PFT and office visit - hold off on rheum visit for now but will entertain based on course   Follow-up - Return to see Dr. Chase Caller I mid nov 2022  -We will do simple walking desaturation test at the time of follow-up

## 2021-08-30 NOTE — Progress Notes (Signed)
OV 07/23/2015  Chief Complaint  Patient presents with   Follow-up    Pt here to discuss CT results and PFT results. Pt states breathing is better since last visit. She is now walking 2 miles three times a week     Follow-up Gold stage II COPD: Currently only on Symbicort. She stopped Spiriva due to itching. She also stopped [place] in past due to cost issues. With Symbicort she is doing really well. She is walking 2 miles a week. Her effort tolerance is improved. She is hardly ever symptomatic. FEV1 today 1.85 L/76% and a ratio of 74 showing very mild obstruction. There are no new issues. She will have a flu shot today. We discussed switching to a long-acting anticholinergic associated with long-acting beta agonist and skipping inhaled steroidal given recent history of hip fracture but she's not interested to change due to cost issues and because she's currently doing well  Multiple lung nodules: She had CT scan of the chest 08/03/2015: She has multiple lung nodules largest 4 mm. This is all stable since October 2014 completing near 2 year stability. She is currently 85 years of age and therefore will not qualify for future low-dose lung cancer screening CT   OV 04/30/2016  Chief Complaint  Patient presents with   Follow-up    breathing has been doing well.  no concerns.CAT Score: 11    Follow-up moderate COPD he is a 9 month routine follow-up. She is doing well. No urgent care visits. No emergency room visits. No prednisone use. No new medical problems diagnosis. [place] and now she has a scratchy throat but several weeks ago she had a cold and did not flareup in the COPD exacerbation. She wants to keep an eye on her cold. She's not interested in inhaler change. She likes her Symbicort. She has enough refills and will call us if needed. She's got good quality of life.   OV 02/09/2017  Chief Complaint  Patient presents with   Follow-up    Pt states she  feels the Symbicort 160 helps better than the 80, she is currently on the 80 dose. Pt c/o throat tickle, hoarseness, and SOB with over activity.  Pt denies chest congestion, CP/tightness.     Follow-up moderate COPD  Since last visit I dropped her Symbicort to the lower dose. She's had 2 exacerbations since then. She also believes that the lower dose Symbicort is not controlling her COPD as well but she's not fully sure if it is a steroid effect on just an individual variation depending on the season. In December she does not want to go up on the steroid dose to a full dose Symbicort. In the past. Event caused itching so this time she wants to hold off on any anticholinergic but if she continues to feel that lower dose Symbicort is not controlling a COPD well then she'll be open to adding a different anticholinergic other than Spiriva going up on the steroid dose. Otherwise doing well. The spring season though is causing some postnasal drainage and increased cough and ticklish throat.   OV 08/14/2017  Chief Complaint  Patient presents with   Follow-up    Pt states that she has been doing good. Pt is currently on presnisone due to arthritis but has two days left of that. Was a little SOB but the prednisone she is currently on is helping with that.   Follow-up moderate COPD  Last seen April 2018. Normally does 9 month follow-up. But at last visit. Drop the steroid dose and therefore she is here in 6 months. COPD stable no exacerbations. Recently shehad neck spasm and is currently finishing a prednisone taper given by orthopedics and this is actually helping his COPD symptoms. COPD cat score is 7 and only shows mild symptom burden. She wants flu shot. No new issues. Chest x-ray September 2018 per report is clear   OV 05/18/2018  Chief Complaint  Patient presents with   Follow-up    Pt states breathing has been worse with the humidity and hot weather. States she has been having to use rescue  inhaler at least twice a day. Pt also states she has been a little more SOB recently, has an occ. cough with clear mucus, but denies any CP.    Follow-up moderate COPD  This is a routine follow-up. She normally does 9 month follow-ups. Last visit October 2018 periods in the interim she's continued to do well. COPD cat score is 6 and slightly better/same compared to the before. She does daily exercises at [facility] which helps. Recently with humidity she is dealing with some increased postnasal drip for which she takes nasal steroids as needed. In the interim has been no medical issues are ER visits or urgent care visits or prednisone use or new medical diagnoses. Last chest x-ray September 2018 clear      OV 06/14/2020  Subjective:  Patient ID: Kelly Delgado, female , DOB: 1936/07/03 , age 85 y.o. , MRN: [id number] , [address]: [address] [place] [id number]   06/14/2020 -   Chief Complaint  Patient presents with   Follow-up    Productive cough with clear phlegm   Follow-up moderate COPD.  HPI Kelly Delgado 85 y.o. -last seen by myself just over 2 years ago in July 2019.  Then in January 2020 she had a COPD exacerbation and seen by nurse practitioner.  Then after that the COVID-19 pandemic struck.  Since then she has been isolating.  We have not been able to see her till today.  She says she is doing well.  She is on Symbicort.  She wants refills.  COPD CAT score is stable.  No interim medical issues no ER visits.  She normally gets vaccines.  However she is reluctant to get the Covid vaccine because of lack of long-term side effects.  She recalls the thalidomide disaster that happened when it was 5 years into the drug and people discovered the side effects.  I acknowledge that we do not have long-term side effect profile on Covid vaccine because of the short duration of the pandemic.  She is really good at social distancing and masking.  We discussed Regeneron monoclonal antibodies as a  way to mitigate her risk in terms of both prophylaxis and treating infection.       OV 03/14/2021  Subjective:  Patient ID: [name], female , DOB: 05-Aug-1936 , age 85 y.o. , MRN: [id number] , [address]: [address] [place] [address] PCP Marda Stalker, PA-C Patient Care Team: Marda Stalker, PA-C as PCP - General (Family Medicine) Brand Males, MD as Consulting Physician (Pulmonary Disease)  This Provider for this visit: Treatment Team:  Attending Provider: Brand Males, MD    03/14/2021 -   Chief Complaint  Patient presents with   Follow-up    Pt states she was doing okay since last visit until about 1 week ago due to having problems from  pollen and states she was having more problems with SOB and also states that she has been coughing which is also keeping her up at night.     HPI Kelly Delgado 84 y.o. -COPD follow-up.  In this visit she says she continues to use her Symbicort.  She brings her husband with her.  She normally exercises at the [facility].  She is not vaccinated against COVID and will not get vaccinated.  She says that she is not coming to close contact with anyone.  No clusters.  Other than when she exercises she is masking.  She avoids people.  However she got exposed to pollen late last week.  Then approximately on Friday, Mar 08, 2021 she worked in the garden with some hanging buds.  Then starting Sunday, Mar 10, 2021 she started getting cough with yellow sputum.  She sounds congested which she says is no sinus drainage.  She feels tired she is more short of breath.  There might be some associated increase in wheezing.  No fever.  The same day she checked her COVID rapid antigen test at home and was negative.  Husband is not sick.  She is not vaccinated against COVID no other sick contacts.  She does not think it is COVID but is willing to get tested with PCR.       OV 07/17/2021  Subjective:  Patient ID: Kelly Delgado, female , DOB:  Mar 01, 1936 , age 86 y.o. , MRN: [id number] , ADDRESS: [place] [address] PCP Marda Stalker, PA-C Patient Care Team: Marda Stalker, PA-C as PCP - General (Family Medicine) Brand Males, MD as Consulting Physician (Pulmonary Disease)  This Provider for this visit: Treatment Team:  Attending Provider: Brand Males, MD    07/17/2021 -   Chief Complaint  Patient presents with   Follow-up    Pt states she had covid about 2 weeks ago. States she is tired all the time and feels like she cannot get her strength back. States she has been coughing some and also has had some increased SOB.   Gold stage II COPD on Symbicort   HPI Nnenna Hodges 85 y.o. -she has Gold stage II COPD on Symbicort.  Last visit May 2022.  Had refused COVID-vaccine.  After that couple of for telephone calls for COPD exacerbation treated with prednisone.  Most recently towards the end of August 2022.  Then on 06/27/2021 we did high-resolution CT chest.  A suggestion of early ILD.  Then several days later around Labor Day 2022 developed COVID .  She did not call us for any antiviral.  She rested and then missed her daughter's wedding and then subsequently got better.  Currently having some residual cough fatigue and shortness of breath and occasional white sputum.  Things are getting better but she feels prednisone could help her.  Today the first day out of the house.  No leg swelling.  No hemoptysis.  No fever or chills. CLINICAL DATA:  85 year old female with history of dyspnea on exertion. Increasing shortness of breath since May 2022. Currently on fourth round of prednisone treatment. History of COPD. Evaluate for interstitial lung disease.   EXAM: CT CHEST WITHOUT CONTRAST   TECHNIQUE: Multidetector CT imaging of the chest was performed following the standard protocol without intravenous contrast. High resolution imaging of the lungs, as well as inspiratory and expiratory  imaging, was performed.   COMPARISON:  Chest CT 07/04/2015.   FINDINGS: Cardiovascular: Heart size is borderline enlarged. There  is no significant pericardial fluid, thickening or pericardial calcification. Aortic atherosclerosis. No definite coronary artery calcifications. Mild calcifications of the mitral annulus.   Mediastinum/Nodes: No pathologically enlarged mediastinal or hilar lymph nodes. Please note that accurate exclusion of hilar adenopathy is limited on noncontrast CT scans. Esophagus is unremarkable in appearance. No axillary lymphadenopathy.   Lungs/Pleura: High-resolution images demonstrate areas of mild septal thickening and severe thickening of the peribronchovascular interstitium with some linear areas of architectural distortion and volume loss, most evident in the right middle lobe and inferior segment of the lingula. No significant regions of traction bronchiectasis or honeycombing. Inspiratory and expiratory imaging demonstrates moderate air trapping indicative of small airways disease. No acute consolidative airspace disease. No pleural effusions. Small calcified granulomas are present. No other suspicious appearing pulmonary nodules or masses are noted.   Upper Abdomen: Aortic atherosclerosis.   Musculoskeletal: There are no aggressive appearing lytic or blastic lesions noted in the visualized portions of the skeleton.   IMPRESSION: 1. The appearance of the lungs may suggest very early or mild interstitial lung disease, with a spectrum of findings considered indeterminate for usual interstitial pneumonia (UIP) per current ATS guidelines, as detailed above. If there is persistent clinical concern for interstitial lung disease, repeat high-resolution chest CT would be recommended in 12 months to assess for temporal changes in the appearance of the lung parenchyma. 2. Aortic atherosclerosis.   Aortic Atherosclerosis (ICD10-I70.0).     Electronically  Signed   By: Vinnie Langton M.D.   On: 06/27/2021 15:40      OV 08/30/2021  Subjective:  Patient ID: Anyra Gelder, female , DOB: [birth year] , age 73 y.o. , MRN: [id number] , ADDRESS: [place] [address] PCP Marda Stalker, PA-C Patient Care Team: Marda Stalker, PA-C as PCP - General (Family Medicine) Brand Males, MD as Consulting Physician (Pulmonary Disease)  This Provider for this visit: Treatment Team:  Attending Provider: Brand Males, MD    08/30/2021 -   Chief Complaint  Patient presents with   Follow-up    Patient has not been feeling good for the last 2 weeks. She called 911 this morning for her breathing and they gave her a breathing treatment. Lots of wheezing, increased shortness of breath. Productive Cough with yellow sputum    Gold stage II COPD on Symbicort  Possible early ILD on CT scan of the chest early September September 2022 pre-COVID COVID  Labor Day 2022 history of outpatient COVID  Multiple COPD exacerbations in 2022 As of HPI Jesenya Vigeant 85 y.o. -last visit 17 July 2021.  At that time we gave her prednisone 5 days.  Then around August 05, 2021 she called and we had to give another prednisone.  Because of repeated COPD exacerbations did IgE this was normal.  Her blood eosinophils was high at 1200 cells per cubic millimeter.  (In 2014 it was 600 cells but then subsequently became 0 cells].  Her rheumatoid factor is slightly positive.  Rest of the autoimmune is normal.  She now tells me that the last that she finished prednisone taper she started on cholesterol medication ZETIA.Marland Kitchen  She is not sure of the exact date but she suspects this was around a few weeks ago.  Then 3 days into it she started feeling short of breath.  She stopped it but she feels respiratory exacerbation is worse she feels is because of his diarrhea.  She is continue to wheeze and have chest tightness and cough no sputum production.  She  called 911 today.  They came to home and gave albuterol nebulizer.  They did not take her to the ER.  She decided to keep this appointment.  She has upcoming PFT appointment and clinic visit in a couple weeks.  She is really frustrated with repeated exacerbations.  She is wondering what is going on.  Today there is audible wheezing but she was not using accessory muscles.  Her PFT appointment is pending.  COPD CAT score shows significant deterioration.  See below.  CAT Score 08/30/2021 06/14/2020 08/14/2017 04/30/2016  Total CAT Score 38 11 7 11       PFT  PFT Results Latest Ref Rng & Units 07/23/2015  FVC-Pre L 2.51  FVC-Predicted Pre % 77  FVC-Post L 2.55  FVC-Predicted Post % 79  Pre FEV1/FVC % % 74  Post FEV1/FCV % % 76  FEV1-Pre L 1.85  FEV1-Predicted Pre % 76  FEV1-Post L 1.93       has a past medical history of Asthmatic bronchitis, Basal cell carcinoma of breast (1990's?), GERD (gastroesophageal reflux disease), H/O hiatal hernia, Hypertension, Migraines, Schatzki's ring, and Shortness of breath.   reports that she quit smoking about 52 years ago. Her smoking use included cigarettes. She has a 4.29 pack-year smoking history. She has never used smokeless tobacco.  Past Surgical History:  Procedure Laterality Date   ABDOMINAL HYSTERECTOMY  1980's   CATARACT EXTRACTION W/ INTRAOCULAR LENS  IMPLANT, BILATERAL Bilateral 1990's   DILATION AND CURETTAGE OF UTERUS  1980's   "1" (06/07/2013)   ESOPHAGEAL DILATION  2012   "just once" (06/07/2013)   HIP PINNING,CANNULATED Right 10/09/2014   Procedure: CANNULATED HIP PINNING;  Surgeon: Renette Butters, MD;  Location: [facility];  Service: Orthopedics;  Laterality: Right;   URETHRAL DIVERTICULUM REPAIR  1970's    Allergies  Allergen Reactions   Ezetimibe Shortness Of Breath   Statins Other (See Comments)    Increased LFT's   Spiriva [Tiotropium Bromide Monohydrate] Itching    Immunization History  Administered Date(s) Administered    Influenza Split 06/26/2009, 09/05/2010, 07/17/2014, 07/23/2015, 08/14/2017   Influenza Whole 06/27/2012   Influenza, High Dose Seasonal PF 08/25/2012, 08/11/2016, 08/14/2017   Influenza,inj,Quad PF,6+ Mos 07/17/2014, 07/23/2015, 07/27/2016, 11/01/2018   Pneumococcal Conjugate-13 07/17/2014   Pneumococcal Polysaccharide-23 12/12/2008, 06/27/2012   Pneumococcal-Unspecified 06/27/2012   Tdap 06/29/2006, 02/07/2015    Family History  Problem Relation Age of Onset   Cancer Mother    Stroke Mother    Hypertension Mother      Current Outpatient Medications:    albuterol (PROVENTIL) (2.5 MG/3ML) 0.083% nebulizer solution, Take 37mL by nebulization every 6 hours as needed for wheezing or SOB, Disp: 75 mL, Rfl: 6   albuterol (VENTOLIN HFA) 108 (90 Base) MCG/ACT inhaler, INHALE 2 PUFFS INTO LUNGS EVERY 6 HRS AS NEEDED FOR WHEEZING/SHORTNESS OF BREATH *NEEDS APPOINTMENT, Disp: 18 each, Rfl: 3   Budeson-Glycopyrrol-Formoterol (BREZTRI AEROSPHERE) 160-9-4.8 MCG/ACT AERO, Inhale 2 puffs into the lungs 2 (two) times daily., Disp: 10.7 g, Rfl: 0   budesonide-formoterol (SYMBICORT) 80-4.5 MCG/ACT inhaler, INHALE 2 PUFFS BY MOUTH INTO THE LUNGS TWICE DAILY, Disp: 10.2 each, Rfl: 9   Calcium Carb-Cholecalciferol (CALCIUM 600 + D PO), Take 1 tablet by mouth every other day. , Disp: , Rfl:    Cholecalciferol (VITAMIN D-3 PO), Take 2,000 Units by mouth daily. , Disp: , Rfl:    hydrochlorothiazide (HYDRODIURIL) 25 MG tablet, Take 25 mg by mouth daily as needed (for swelling). , Disp: ,  Rfl:    vitamin C (ASCORBIC ACID) 500 MG tablet, Take 500 mg by mouth daily., Disp: , Rfl:       Objective:   Vitals:   08/30/21 1557  BP: (!) 142/100  Pulse: 80  Temp: 98 F (36.7 C)  TempSrc: Oral  SpO2: 94%  Weight: 179 lb 3.2 oz (81.3 kg)  Height: 5' 7.5" (1.715 m)    Estimated body mass index is 27.65 kg/m as calculated from the following:   Height as of this encounter: 5' 7.5" (1.715 m).   Weight as of  this encounter: 179 lb 3.2 oz (81.3 kg).  @WEIGHTCHANGE @  [employer]   08/30/21 1557  Weight: 179 lb 3.2 oz (81.3 kg)     Physical Exam General: No distress.  Looks well but is coughing and sometimes can hear wheezing Neuro: Alert and Oriented x 3. GCS 15. Speech normal Psych: Pleasant Resp:  Barrel Chest - no.  Wheeze - yes, Crackles - no, No overt respiratory distress CVS: Normal heart sounds. Murmurs - no Ext: Stigmata of Connective Tissue Disease - no HEENT: Normal upper airway. PEERL +. No post nasal drip        Assessment:       ICD-10-CM   1. COPD with acute exacerbation ([place])  J44.1     2. COPD, frequent exacerbations ([place])  J44.1     3. Stage 2 moderate COPD by GOLD classification ([place]) with intolerace (itch) to spiriva  J44.9     4. ILD (interstitial lung disease) ([place])  J84.9     5. History of 2019 novel coronavirus disease (COVID-19)  Z86.16     6. Rheumatoid factor positive  R76.8     7. Eosinophilia, unspecified type  D72.10          Plan:     Patient Instructions     ICD-10-CM   1. COPD with acute exacerbation ([place])  J44.1     2. COPD, frequent exacerbations ([place])  J44.1     3. Stage 2 moderate COPD by GOLD classification ([place]) with intolerace (itch) to spiriva  J44.9     4. ILD (interstitial lung disease) ([place])  J84.9     5. History of 2019 novel coronavirus disease (COVID-19)  Z86.16     6. Rheumatoid factor positive  R76.8     7. Eosinophilia, unspecified type  D72.10       YOu have another flare up  - could be eztemibe - CMA to mark it as allergy  - but you also have high blood eosinophils suggesting you are having some allergic phenoptyoe  Your rheumatoid factor OCt 2022 is borderline high    - unclear significance   Also of note your September 2022 CT scan of the chest other than emphysema is showing suggestion of early onset of what is called interstitial lung disease or pulmonary fibrosis.  The radiologist not sure but  it is a suspicion at this point   Plan  - do RAST allergy blood test 08/30/2021 or 2 weeks after last dose of steroids  - Take prednisone 40 mg daily x 2 days, then 20mg  daily x 2 days, then 10mg  daily x 2 days, then 5mg  daily x 2 days and stop - Change symbicor to BREZTRI 2 puff twice daily  - further step up threapies include consideration of roflumilast or biologics against eosinophils or clinical trials - mid Novemeber keep up PFT and office visit - hold off on rheum visit for now  but will entertain based on course   Follow-up - Return to see Dr. Chase Caller I mid nov 2022  -We will do simple walking desaturation test at the time of follow-up    SIGNATURE    Dr. Brand Males, M.D., F.C.C.P,  Pulmonary and Critical Care Medicine Staff Physician, [place] Director - Interstitial Lung Disease  Program  Pulmonary [place] [facility] [place], [place], [address]  Pager: [phone], If no answer or between  15:00h - 7:00h: call [phone] Telephone: [phone]  6:17 PM 08/30/2021 for

## 2021-08-31 ENCOUNTER — Ambulatory Visit: Payer: Medicare Other

## 2021-09-09 ENCOUNTER — Telehealth: Payer: Self-pay | Admitting: Internal Medicine

## 2021-09-09 MED ORDER — BREZTRI AEROSPHERE 160-9-4.8 MCG/ACT IN AERO: 2.0000 | INHALATION_SPRAY | Freq: Two times a day (BID) | RESPIRATORY_TRACT | 2 refills | Status: DC

## 2021-09-09 NOTE — Telephone Encounter (Signed)
I called the patient and let her know that I would send in a refill of Breztri to the pharmacy. Nothing further needed.

## 2021-09-12 ENCOUNTER — Ambulatory Visit (INDEPENDENT_AMBULATORY_CARE_PROVIDER_SITE_OTHER): Payer: Medicare Other | Admitting: Internal Medicine

## 2021-09-12 ENCOUNTER — Encounter: Payer: Self-pay | Admitting: Internal Medicine

## 2021-09-12 ENCOUNTER — Ambulatory Visit: Payer: Medicare Other | Admitting: Internal Medicine

## 2021-09-12 ENCOUNTER — Other Ambulatory Visit: Payer: Self-pay

## 2021-09-12 VITALS — BP 132/80 | HR 74 | Temp 97.3°F | Ht 68.0 in | Wt 181.0 lb

## 2021-09-12 DIAGNOSIS — J441 Chronic obstructive pulmonary disease with (acute) exacerbation: Secondary | ICD-10-CM | POA: Diagnosis not present

## 2021-09-12 DIAGNOSIS — J449 Chronic obstructive pulmonary disease, unspecified: Secondary | ICD-10-CM

## 2021-09-12 LAB — PULMONARY FUNCTION TEST
DL/VA % pred: 122 %
DL/VA: 4.83 ml/min/mmHg/L
DLCO cor % pred: 79 %
DLCO cor: 16.92 ml/min/mmHg
DLCO unc % pred: 80 %
DLCO unc: 17.02 ml/min/mmHg
FEF 25-75 Post: 2 L/sec
FEF 25-75 Pre: 0.75 L/sec
FEF2575-%Change-Post: 165 %
FEF2575-%Pred-Post: 134 %
FEF2575-%Pred-Pre: 50 %
FEV1-%Change-Post: 30 %
FEV1-%Pred-Post: 76 %
FEV1-%Pred-Pre: 58 %
FEV1-Post: 1.71 L
FEV1-Pre: 1.31 L
FEV1FVC-%Change-Post: 10 %
FEV1FVC-%Pred-Pre: 93 %
FEV6-%Change-Post: 18 %
FEV6-%Pred-Post: 80 %
FEV6-%Pred-Pre: 68 %
FEV6-Post: 2.28 L
FEV6-Pre: 1.92 L
FEV6FVC-%Pred-Post: 105 %
FEV6FVC-%Pred-Pre: 105 %
FVC-%Change-Post: 18 %
FVC-%Pred-Post: 76 %
FVC-%Pred-Pre: 64 %
FVC-Post: 2.28 L
FVC-Pre: 1.92 L
Post FEV1/FVC ratio: 75 %
Post FEV6/FVC ratio: 100 %
Pre FEV1/FVC ratio: 68 %
Pre FEV6/FVC Ratio: 100 %
RV % pred: 100 %
RV: 2.66 L
TLC % pred: 79 %
TLC: 4.51 L

## 2021-09-12 NOTE — Progress Notes (Signed)
Full PFT performed today. °

## 2021-09-12 NOTE — Patient Instructions (Signed)
Full PFT performed today. °

## 2021-09-12 NOTE — Progress Notes (Signed)
OV 07/23/2015  Chief Complaint  Patient presents with   Follow-up    Pt here to discuss CT results and PFT results. Pt states breathing is better since last visit. She is now walking 2 miles three times a week     Follow-up Gold stage II COPD: Currently only on Symbicort. She stopped Spiriva due to itching. She also stopped [place] in past due to cost issues. With Symbicort she is doing really well. She is walking 2 miles a week. Her effort tolerance is improved. She is hardly ever symptomatic. FEV1 today 1.85 L/76% and a ratio of 74 showing very mild obstruction. There are no new issues. She will have a flu shot today. We discussed switching to a long-acting anticholinergic associated with long-acting beta agonist and skipping inhaled steroidal given recent history of hip fracture but she's not interested to change due to cost issues and because she's currently doing well  Multiple lung nodules: She had CT scan of the chest 08/03/2015: She has multiple lung nodules largest 4 mm. This is all stable since October 2014 completing near 2 year stability. She is currently 85 years of age and therefore will not qualify for future low-dose lung cancer screening CT   OV 04/30/2016  Chief Complaint  Patient presents with   Follow-up    breathing has been doing well.  no concerns.CAT Score: 11    Follow-up moderate COPD he is a 9 month routine follow-up. She is doing well. No urgent care visits. No emergency room visits. No prednisone use. No new medical problems diagnosis. [place] and now she has a scratchy throat but several weeks ago she had a cold and did not flareup in the COPD exacerbation. She wants to keep an eye on her cold. She's not interested in inhaler change. She likes her Symbicort. She has enough refills and will call us if needed. She's got good quality of life.   OV 02/09/2017  Chief Complaint  Patient presents with   Follow-up    Pt states she  feels the Symbicort 160 helps better than the 80, she is currently on the 80 dose. Pt c/o throat tickle, hoarseness, and SOB with over activity.  Pt denies chest congestion, CP/tightness.     Follow-up moderate COPD  Since last visit I dropped her Symbicort to the lower dose. She's had 2 exacerbations since then. She also believes that the lower dose Symbicort is not controlling her COPD as well but she's not fully sure if it is a steroid effect on just an individual variation depending on the season. In December she does not want to go up on the steroid dose to a full dose Symbicort. In the past. Event caused itching so this time she wants to hold off on any anticholinergic but if she continues to feel that lower dose Symbicort is not controlling a COPD well then she'll be open to adding a different anticholinergic other than Spiriva going up on the steroid dose. Otherwise doing well. The spring season though is causing some postnasal drainage and increased cough and ticklish throat.   OV 08/14/2017  Chief Complaint  Patient presents with   Follow-up    Pt states that she has been doing good. Pt is currently on presnisone due to arthritis but has two days left of that. Was a little SOB but the prednisone she is currently on is helping with that.   Follow-up moderate COPD  Last seen April 2018. Normally does 9 month follow-up. But at last visit. Drop the steroid dose and therefore she is here in 6 months. COPD stable no exacerbations. Recently shehad neck spasm and is currently finishing a prednisone taper given by orthopedics and this is actually helping his COPD symptoms. COPD cat score is 7 and only shows mild symptom burden. She wants flu shot. No new issues. Chest x-ray September 2018 per report is clear   OV 05/18/2018  Chief Complaint  Patient presents with   Follow-up    Pt states breathing has been worse with the humidity and hot weather. States she has been having to use rescue  inhaler at least twice a day. Pt also states she has been a little more SOB recently, has an occ. cough with clear mucus, but denies any CP.    Follow-up moderate COPD  This is a routine follow-up. She normally does 9 month follow-ups. Last visit October 2018 periods in the interim she's continued to do well. COPD cat score is 6 and slightly better/same compared to the before. She does daily exercises at [facility] which helps. Recently with humidity she is dealing with some increased postnasal drip for which she takes nasal steroids as needed. In the interim has been no medical issues are ER visits or urgent care visits or prednisone use or new medical diagnoses. Last chest x-ray September 2018 clear      OV 06/14/2020  Subjective:  Patient ID: Kelly Delgado, female , DOB: 1936/07/03 , age 85 y.o. , MRN: [id number] , [address]: [address] [place] [address]   06/14/2020 -   Chief Complaint  Patient presents with   Follow-up    Productive cough with clear phlegm   Follow-up moderate COPD.  HPI Kelly Delgado 84 y.o. -last seen by myself just over 2 years ago in July 2019.  Then in January 2020 she had a COPD exacerbation and seen by nurse practitioner.  Then after that the COVID-19 pandemic struck.  Since then she has been isolating.  We have not been able to see her till today.  She says she is doing well.  She is on Symbicort.  She wants refills.  COPD CAT score is stable.  No interim medical issues no ER visits.  She normally gets vaccines.  However she is reluctant to get the Covid vaccine because of lack of long-term side effects.  She recalls the thalidomide disaster that happened when it was 5 years into the drug and people discovered the side effects.  I acknowledge that we do not have long-term side effect profile on Covid vaccine because of the short duration of the pandemic.  She is really good at social distancing and masking.  We discussed Regeneron monoclonal antibodies as a  way to mitigate her risk in terms of both prophylaxis and treating infection.       OV 03/14/2021  Subjective:  Patient ID: Kelly Delgado, female , DOB: 05-Aug-1936 , age 54 y.o. , MRN: [id number] , [address]: [address] [place] [address] PCP Marda Stalker, PA-C Patient Care Team: Marda Stalker, PA-C as PCP - General (Family Medicine) Brand Males, MD as Consulting Physician (Pulmonary Disease)  This Provider for this visit: Treatment Team:  Attending Provider: Brand Males, MD    03/14/2021 -   Chief Complaint  Patient presents with   Follow-up    Pt states she was doing okay since last visit until about 1 week ago due to having problems from  pollen and states she was having more problems with SOB and also states that she has been coughing which is also keeping her up at night.     HPI Devani Lehnert 84 y.o. -COPD follow-up.  In this visit she says she continues to use her Symbicort.  She brings her husband with her.  She normally exercises at the [facility].  She is not vaccinated against COVID and will not get vaccinated.  She says that she is not coming to close contact with anyone.  No clusters.  Other than when she exercises she is masking.  She avoids people.  However she got exposed to pollen late last week.  Then approximately on Friday, Mar 08, 2021 she worked in the garden with some hanging buds.  Then starting Sunday, Mar 10, 2021 she started getting cough with yellow sputum.  She sounds congested which she says is no sinus drainage.  She feels tired she is more short of breath.  There might be some associated increase in wheezing.  No fever.  The same day she checked her COVID rapid antigen test at home and was negative.  Husband is not sick.  She is not vaccinated against COVID no other sick contacts.  She does not think it is COVID but is willing to get tested with PCR.       OV 07/17/2021  Subjective:  Patient ID: Kelly Delgado, female , DOB:  Mar 01, 1936 , age 86 y.o. , MRN: [id number] , ADDRESS: [place] [address] PCP Marda Stalker, PA-C Patient Care Team: Marda Stalker, PA-C as PCP - General (Family Medicine) Brand Males, MD as Consulting Physician (Pulmonary Disease)  This Provider for this visit: Treatment Team:  Attending Provider: Brand Males, MD    07/17/2021 -   Chief Complaint  Patient presents with   Follow-up    Pt states she had covid about 2 weeks ago. States she is tired all the time and feels like she cannot get her strength back. States she has been coughing some and also has had some increased SOB.   Gold stage II COPD on Symbicort   HPI Kelly Delgado 85 y.o. -she has Gold stage II COPD on Symbicort.  Last visit May 2022.  Had refused COVID-vaccine.  After that couple of for telephone calls for COPD exacerbation treated with prednisone.  Most recently towards the end of August 2022.  Then on 06/27/2021 we did high-resolution CT chest.  A suggestion of early ILD.  Then several days later around Labor Day 2022 developed COVID .  She did not call us for any antiviral.  She rested and then missed her daughter's wedding and then subsequently got better.  Currently having some residual cough fatigue and shortness of breath and occasional white sputum.  Things are getting better but she feels prednisone could help her.  Today the first day out of the house.  No leg swelling.  No hemoptysis.  No fever or chills. CLINICAL DATA:  85 year old female with history of dyspnea on exertion. Increasing shortness of breath since May 2022. Currently on fourth round of prednisone treatment. History of COPD. Evaluate for interstitial lung disease.   EXAM: CT CHEST WITHOUT CONTRAST   TECHNIQUE: Multidetector CT imaging of the chest was performed following the standard protocol without intravenous contrast. High resolution imaging of the lungs, as well as inspiratory and expiratory  imaging, was performed.   COMPARISON:  Chest CT 07/04/2015.   FINDINGS: Cardiovascular: Heart size is borderline enlarged. There  is no significant pericardial fluid, thickening or pericardial calcification. Aortic atherosclerosis. No definite coronary artery calcifications. Mild calcifications of the mitral annulus.   Mediastinum/Nodes: No pathologically enlarged mediastinal or hilar lymph nodes. Please note that accurate exclusion of hilar adenopathy is limited on noncontrast CT scans. Esophagus is unremarkable in appearance. No axillary lymphadenopathy.   Lungs/Pleura: High-resolution images demonstrate areas of mild septal thickening and severe thickening of the peribronchovascular interstitium with some linear areas of architectural distortion and volume loss, most evident in the right middle lobe and inferior segment of the lingula. No significant regions of traction bronchiectasis or honeycombing. Inspiratory and expiratory imaging demonstrates moderate air trapping indicative of small airways disease. No acute consolidative airspace disease. No pleural effusions. Small calcified granulomas are present. No other suspicious appearing pulmonary nodules or masses are noted.   Upper Abdomen: Aortic atherosclerosis.   Musculoskeletal: There are no aggressive appearing lytic or blastic lesions noted in the visualized portions of the skeleton.   IMPRESSION: 1. The appearance of the lungs may suggest very early or mild interstitial lung disease, with a spectrum of findings considered indeterminate for usual interstitial pneumonia (UIP) per current ATS guidelines, as detailed above. If there is persistent clinical concern for interstitial lung disease, repeat high-resolution chest CT would be recommended in 12 months to assess for temporal changes in the appearance of the lung parenchyma. 2. Aortic atherosclerosis.   Aortic Atherosclerosis (ICD10-I70.0).     Electronically  Signed   By: Vinnie Langton M.D.   On: 06/27/2021 15:40      OV 08/30/2021  Subjective:  Patient ID: Kelly Delgado, female , DOB: [birth year] , age 73 y.o. , MRN: [id number] , ADDRESS: [place] [address] PCP Marda Stalker, PA-C Patient Care Team: Marda Stalker, PA-C as PCP - General (Family Medicine) Brand Males, MD as Consulting Physician (Pulmonary Disease)  This Provider for this visit: Treatment Team:  Attending Provider: Brand Males, MD    08/30/2021 -   Chief Complaint  Patient presents with   Follow-up    Patient has not been feeling good for the last 2 weeks. She called 911 this morning for her breathing and they gave her a breathing treatment. Lots of wheezing, increased shortness of breath. Productive Cough with yellow sputum    Gold stage II COPD on Symbicort  Possible early ILD on CT scan of the chest early September September 2022 pre-COVID COVID  Labor Day 2022 history of outpatient COVID  Multiple COPD exacerbations in 2022 As of HPI Kelly Delgado 85 y.o. -last visit 17 July 2021.  At that time we gave her prednisone 5 days.  Then around August 05, 2021 she called and we had to give another prednisone.  Because of repeated COPD exacerbations did IgE this was normal.  Her blood eosinophils was high at 1200 cells per cubic millimeter.  (In 2014 it was 600 cells but then subsequently became 0 cells].  Her rheumatoid factor is slightly positive.  Rest of the autoimmune is normal.  She now tells me that the last that she finished prednisone taper she started on cholesterol medication ZETIA.Marland Kitchen  She is not sure of the exact date but she suspects this was around a few weeks ago.  Then 3 days into it she started feeling short of breath.  She stopped it but she feels respiratory exacerbation is worse she feels is because of his diarrhea.  She is continue to wheeze and have chest tightness and cough no sputum production.  She  called 911 today.  They came to home and gave albuterol nebulizer.  They did not take her to the ER.  She decided to keep this appointment.  She has upcoming PFT appointment and clinic visit in a couple weeks.  She is really frustrated with repeated exacerbations.  She is wondering what is going on.  Today there is audible wheezing but she was not using accessory muscles.  Her PFT appointment is pending.  COPD CAT score shows significant deterioration.  See below.   OV 09/12/2021  Subjective:  Patient ID: Jalaiyah Provencher, female , DOB: [birth year] , age 62 y.o. , MRN: [id number] , ADDRESS: [place] [address] PCP Marda Stalker, PA-C Patient Care Team: Marda Stalker, PA-C as PCP - General (Family Medicine) Brand Males, MD as Consulting Physician (Pulmonary Disease)  This Provider for this visit: Treatment Team:  Attending Provider: Brand Males, MD  Borderlne positive RF - 47 in Oct 22 (other serology negative)  NEg Quant Gold Oct 2022  Gold stage II COPD on Symbicort  Possible early ILD on CT scan of the chest early September September 2022 pre-COVID COVID  Labor Day 2022 history of outpatient COVID  Multiple COPD exacerbations   09/12/2021 -   Chief Complaint  Patient presents with   Follow-up    Judithann Sauger is working better     HPI Vallery Ola 85 y.o. -returns for follow-up.  Seen earlier in the month for COPD exacerbation and repeated exacerbations.  Given prednisone and also given Breztri.  We are looking for other causes for multiple repeated exacerbations.  I wanted RAST allergy panel to be done 2 weeks after she finishes prednisone.  She only prednisone prednisone a few days ago.  She is going to wait and get it.  After the recent steroid burst she is feeling better.  She did have repeat pulmonary function testing it shows a slight decline compared to 2016.  Nevertheless she is feeling better with the [facility].  She is beginning to go  back to the [facility] and start exercising.  She is now walking half a mile [baseline 2 miles].  She is okay with approach of watchful waiting at this point.  She did a walking desaturation test and did not desaturate.  She walked 185 feet x 3 laps in the office.    CT Chest data  No results found.    PFT  PFT Results Latest Ref Rng & Units 09/12/2021 07/23/2015  FVC-Pre L 1.92 2.51  FVC-Predicted Pre % 64 77  FVC-Post L 2.28 2.55  FVC-Predicted Post % 76 79  Pre FEV1/FVC % % 68 74  Post FEV1/FCV % % 75 76  FEV1-Pre L 1.31 1.85  FEV1-Predicted Pre % 58 76  FEV1-Post L 1.71 1.93  DLCO uncorrected ml/min/mmHg 17.02 -  DLCO UNC% % 80 -  DLCO corrected ml/min/mmHg 16.92 -  DLCO COR %Predicted % 79 -  DLVA Predicted % 122 -  TLC L 4.51 -  TLC % Predicted % 79 -  RV % Predicted % 100 -       has a past medical history of Asthmatic bronchitis, Basal cell carcinoma of breast (1990's?), GERD (gastroesophageal reflux disease), H/O hiatal hernia, Hypertension, Migraines, Schatzki's ring, and Shortness of breath.   reports that she quit smoking about 52 years ago. Her smoking use included cigarettes. She has a 4.29 pack-year smoking history. She has never used smokeless tobacco.  Past Surgical History:  Procedure Laterality Date  ABDOMINAL HYSTERECTOMY  1980's   CATARACT EXTRACTION W/ INTRAOCULAR LENS  IMPLANT, BILATERAL Bilateral 1990's   DILATION AND CURETTAGE OF UTERUS  1980's   "1" (06/07/2013)   ESOPHAGEAL DILATION  2012   "just once" (06/07/2013)   HIP PINNING,CANNULATED Right 10/09/2014   Procedure: CANNULATED HIP PINNING;  Surgeon: Renette Butters, MD;  Location: [facility];  Service: Orthopedics;  Laterality: Right;   URETHRAL DIVERTICULUM REPAIR  1970's    Allergies  Allergen Reactions   Ezetimibe Shortness Of Breath   Statins Other (See Comments)    Increased LFT's   Spiriva [Tiotropium Bromide Monohydrate] Itching    Immunization History  Administered Date(s)  Administered   Influenza Split 06/26/2009, 09/05/2010, 07/17/2014, 07/23/2015, 08/14/2017   Influenza Whole 06/27/2012   Influenza, High Dose Seasonal PF 08/25/2012, 08/11/2016, 08/14/2017   Influenza,inj,Quad PF,6+ Mos 07/17/2014, 07/23/2015, 07/27/2016, 11/01/2018   Pneumococcal Conjugate-13 07/17/2014   Pneumococcal Polysaccharide-23 12/12/2008, 06/27/2012   Pneumococcal-Unspecified 06/27/2012   Tdap 06/29/2006, 02/07/2015    Family History  Problem Relation Age of Onset   Cancer Mother    Stroke Mother    Hypertension Mother      Current Outpatient Medications:    albuterol (PROVENTIL) (2.5 MG/3ML) 0.083% nebulizer solution, Take 10mL by nebulization every 6 hours as needed for wheezing or SOB, Disp: 75 mL, Rfl: 6   albuterol (VENTOLIN HFA) 108 (90 Base) MCG/ACT inhaler, INHALE 2 PUFFS INTO LUNGS EVERY 6 HRS AS NEEDED FOR WHEEZING/SHORTNESS OF BREATH *NEEDS APPOINTMENT, Disp: 18 each, Rfl: 3   Budeson-Glycopyrrol-Formoterol (BREZTRI AEROSPHERE) 160-9-4.8 MCG/ACT AERO, Inhale 2 puffs into the lungs in the morning and at bedtime., Disp: 10.7 g, Rfl: 2   Calcium Carb-Cholecalciferol (CALCIUM 600 + D PO), Take 1 tablet by mouth every other day. , Disp: , Rfl:    Cholecalciferol (VITAMIN D-3 PO), Take 2,000 Units by mouth daily. , Disp: , Rfl:    hydrochlorothiazide (HYDRODIURIL) 25 MG tablet, Take 25 mg by mouth daily as needed (for swelling). , Disp: , Rfl:    vitamin C (ASCORBIC ACID) 500 MG tablet, Take 500 mg by mouth daily., Disp: , Rfl:    budesonide-formoterol (SYMBICORT) 80-4.5 MCG/ACT inhaler, INHALE 2 PUFFS BY MOUTH INTO THE LUNGS TWICE DAILY (Patient not taking: Reported on 09/12/2021), Disp: 10.2 each, Rfl: 9      Objective:   Vitals:   09/12/21 1204  BP: 132/80  Pulse: 74  Temp: (!) 97.3 F (36.3 C)  SpO2: 97%  Weight: 181 lb (82.1 kg)  Height: 5\' 8"  (1.727 m)    Estimated body mass index is 27.52 kg/m as calculated from the following:   Height as of this  encounter: 5\' 8"  (1.727 m).   Weight as of this encounter: 181 lb (82.1 kg).  @WEIGHTCHANGE @  Filed Weights   09/12/21 1204  Weight: 181 lb (82.1 kg)     Physical Exam    General: No distress. Looks well Neuro: Alert and Oriented x 3. GCS 15. Speech normal Psych: Pleasant Resp:  Barrel Chest - no.  Wheeze - no, Crackles - no, No overt respiratory distress CVS: Normal heart sounds. Murmurs - no Ext: Stigmata of Connective Tissue Disease - no HEENT: Normal upper airway. PEERL +. No post nasal drip        Assessment:       ICD-10-CM   1. COPD with acute exacerbation ([place])  J44.1 Pulmonary function test         Plan:     Patient Instructions  ICD-10-CM   1. COPD with acute exacerbation ([place])  J44.1     2. COPD, frequent exacerbations ([place])  J44.1     3. Stage 2 moderate COPD by GOLD classification ([place]) with intolerace (itch) to spiriva  J44.9     4. ILD (interstitial lung disease) ([place])  J84.9     5. History of 2019 novel coronavirus disease (COVID-19)  Z86.16     6. Rheumatoid factor positive  R76.8     7. Eosinophilia, unspecified type  D72.10       Glad you are better with time and BREZTRI and back at [facility]  exercising   Plan  - do RAST allergy blood test 2 weeks after last dose of steroids which was fdew days ago   - CMA to make sure there is order  -Continue  BREZTRI 2 puff twice daily  - further step up threapies include consideration of roflumilast or biologics against eosinophils or clinical trials -  hold off on rheum visit for now but will entertain based on course - do PFT In 6 months   Follow-up - Return to see Dr. Chase Caller in 6 months or sooner if needed    SIGNATURE    Dr. Brand Males, M.D., F.C.C.P,  Pulmonary and Critical Care Medicine Staff Physician, [place] Director - Interstitial Lung Disease  Program  Pulmonary [place] [facility] [place], [place],  [address]  Pager: [phone], If no answer or between  15:00h - 7:00h: call [phone] Telephone: [phone]  1:43 PM 09/12/2021

## 2021-09-12 NOTE — Patient Instructions (Addendum)
    ICD-10-CM   1. COPD with acute exacerbation ([place])  J44.1     2. COPD, frequent exacerbations ([place])  J44.1     3. Stage 2 moderate COPD by GOLD classification ([place]) with intolerace (itch) to spiriva  J44.9     4. ILD (interstitial lung disease) ([place])  J84.9     5. History of 2019 novel coronavirus disease (COVID-19)  Z86.16     6. Rheumatoid factor positive  R76.8     7. Eosinophilia, unspecified type  D72.10       Glad you are better with time and BREZTRI and back at [facility]  exercising   Plan  - do RAST allergy blood test 2 weeks after last dose of steroids which was fdew days ago   - CMA to make sure there is order  -Continue  BREZTRI 2 puff twice daily  - further step up threapies include consideration of roflumilast or biologics against eosinophils or clinical trials -  hold off on rheum visit for now but will entertain based on course - do PFT In 6 months   Follow-up - Return to see Dr. Chase Caller in 6 months or sooner if needed

## 2021-09-18 DIAGNOSIS — M810 Age-related osteoporosis without current pathological fracture: Secondary | ICD-10-CM | POA: Diagnosis not present

## 2021-09-18 DIAGNOSIS — E785 Hyperlipidemia, unspecified: Secondary | ICD-10-CM | POA: Diagnosis not present

## 2021-09-18 DIAGNOSIS — K219 Gastro-esophageal reflux disease without esophagitis: Secondary | ICD-10-CM | POA: Diagnosis not present

## 2021-09-18 DIAGNOSIS — I10 Essential (primary) hypertension: Secondary | ICD-10-CM | POA: Diagnosis not present

## 2021-09-18 DIAGNOSIS — E7800 Pure hypercholesterolemia, unspecified: Secondary | ICD-10-CM | POA: Diagnosis not present

## 2021-09-23 ENCOUNTER — Other Ambulatory Visit: Payer: Medicare Other

## 2021-09-23 DIAGNOSIS — R768 Other specified abnormal immunological findings in serum: Secondary | ICD-10-CM | POA: Diagnosis not present

## 2021-09-23 DIAGNOSIS — D721 Eosinophilia, unspecified: Secondary | ICD-10-CM

## 2021-09-23 DIAGNOSIS — J849 Interstitial pulmonary disease, unspecified: Secondary | ICD-10-CM | POA: Diagnosis not present

## 2021-09-23 DIAGNOSIS — J441 Chronic obstructive pulmonary disease with (acute) exacerbation: Secondary | ICD-10-CM | POA: Diagnosis not present

## 2021-09-30 LAB — ALLERGEN PROFILE, PERENNIAL ALLERGEN IGE
Alternaria Alternata IgE: <0.1 kU/L
Aspergillus Fumigatus IgE: <0.1 kU/L
Aureobasidi Pullulans IgE: <0.1 kU/L
Candida Albicans IgE: <0.1 kU/L
Cat Dander IgE: <0.1 kU/L
Chicken Feathers IgE: <0.1 kU/L
Cladosporium Herbarum IgE: <0.1 kU/L
Cow Dander IgE: <0.1 kU/L
D Farinae IgE: <0.1 kU/L
D Pteronyssinus IgE: <0.1 kU/L
Dog Dander IgE: 0.38 kU/L — AB
Duck Feathers IgE: <0.1 kU/L
Goose Feathers IgE: <0.1 kU/L
Mouse Urine IgE: <0.1 kU/L
Mucor Racemosus IgE: <0.1 kU/L
Penicillium Chrysogen IgE: <0.1 kU/L
Phoma Betae IgE: <0.1 kU/L
Setomelanomma Rostrat: <0.1 kU/L
Stemphylium Herbarum IgE: <0.1 kU/L

## 2021-10-04 ENCOUNTER — Ambulatory Visit
Admission: RE | Admit: 2021-10-04 | Discharge: 2021-10-04 | Disposition: A | Discharge status: Home / Self Care | Payer: Medicare Other | Source: Ambulatory Visit | Attending: Family Medicine | Admitting: Family Medicine

## 2021-10-04 DIAGNOSIS — Z1231 Encounter for screening mammogram for malignant neoplasm of breast: Secondary | ICD-10-CM

## 2021-10-08 ENCOUNTER — Other Ambulatory Visit: Payer: Self-pay | Admitting: Family Medicine

## 2021-10-08 DIAGNOSIS — R928 Other abnormal and inconclusive findings on diagnostic imaging of breast: Secondary | ICD-10-CM

## 2021-10-25 ENCOUNTER — Ambulatory Visit
Admission: RE | Admit: 2021-10-25 | Discharge: 2021-10-25 | Disposition: A | Discharge status: Home / Self Care | Payer: Medicare Other | Source: Ambulatory Visit | Attending: Family Medicine | Admitting: Family Medicine

## 2021-10-25 ENCOUNTER — Other Ambulatory Visit: Payer: Self-pay | Admitting: Family Medicine

## 2021-10-25 DIAGNOSIS — R928 Other abnormal and inconclusive findings on diagnostic imaging of breast: Secondary | ICD-10-CM

## 2021-10-25 DIAGNOSIS — R922 Inconclusive mammogram: Secondary | ICD-10-CM | POA: Diagnosis not present

## 2021-10-25 DIAGNOSIS — R921 Mammographic calcification found on diagnostic imaging of breast: Secondary | ICD-10-CM | POA: Diagnosis not present

## 2021-10-25 DIAGNOSIS — N6310 Unspecified lump in the right breast, unspecified quadrant: Secondary | ICD-10-CM

## 2021-10-30 ENCOUNTER — Telehealth: Payer: Self-pay | Admitting: Internal Medicine

## 2021-10-30 NOTE — Telephone Encounter (Signed)
Primary Pulmonologist: Ramaswamy Last office visit and with whom: 09/12/2021 Ramaswamy What do we see them for (pulmonary problems): COPD, Lung nodule Last OV assessment/plan:  Assessment:         ICD-10-CM    1. COPD with acute exacerbation ([place])  J44.1 Pulmonary function test            Plan:     Patient Instructions        ICD-10-CM    1. COPD with acute exacerbation ([place])  J44.1       2. COPD, frequent exacerbations ([place])  J44.1       3. Stage 2 moderate COPD by GOLD classification ([place]) with intolerace (itch) to spiriva  J44.9       4. ILD (interstitial lung disease) ([place])  J84.9       5. History of 2019 novel coronavirus disease (COVID-19)  Z86.16       6. Rheumatoid factor positive  R76.8       7. Eosinophilia, unspecified type  D72.10           Glad you are better with time and BREZTRI and back at [facility]  exercising     Plan  - do RAST allergy blood test 2 weeks after last dose of steroids which was fdew days ago              - CMA to make sure there is order  -Continue  BREZTRI 2 puff twice daily  - further step up threapies include consideration of roflumilast or biologics against eosinophils or clinical trials -  hold off on rheum visit for now but will entertain based on course - do PFT In 6 months     Follow-up - Return to see Dr. Chase Caller in 6 months or sooner if needed       SIGNATURE      Dr. Brand Males, M.D., F.C.C.P,  Pulmonary and Critical Care Medicine Staff Physician, [place] Director - Interstitial Lung Disease  Program  [facility] [place], [place], [address]   Pager: [phone], If no answer or between  15:00h - 7:00h: call [phone] Telephone: [phone]   1:43 PM 09/12/2021        Patient Instructions by Brand Males, MD at 09/12/2021 12:00 PM  Author: Brand Males, MD Author Type: Physician Filed: 09/12/2021 12:50 PM  Note  Status: Addendum Mickle Mallory: Cosign Not Required Encounter Date: 09/12/2021  Editor: Brand Males, MD (Physician)      Prior Versions: 1. Brand Males, MD (Physician) at 09/12/2021 12:45 PM - Addendum   2. Brand Males, MD (Physician) at 09/12/2021 12:36 PM - Signed        ICD-10-CM    1. COPD with acute exacerbation ([place])  J44.1       2. COPD, frequent exacerbations ([place])  J44.1       3. Stage 2 moderate COPD by GOLD classification ([place]) with intolerace (itch) to spiriva  J44.9       4. ILD (interstitial lung disease) ([place])  J84.9       5. History of 2019 novel coronavirus disease (COVID-19)  Z86.16       6. Rheumatoid factor positive  R76.8       7. Eosinophilia, unspecified type  D72.10           Glad you are better with time and BREZTRI and back at [facility]  exercising  Plan  - do RAST allergy blood test 2 weeks after last dose of steroids which was fdew days ago              - CMA to make sure there is order  -Continue  BREZTRI 2 puff twice daily  - further step up threapies include consideration of roflumilast or biologics against eosinophils or clinical trials -  hold off on rheum visit for now but will entertain based on course - do PFT In 6 months     Follow-up - Return to see Dr. Chase Caller in 6 months or sooner if needed       Orthostatic Vitals Recorded in This Encounter   09/12/2021  1204     BP Location: Left Arm  Cuff Size: Normal   Instructions        ICD-10-CM    1. COPD with acute exacerbation ([place])  J44.1       2. COPD, frequent exacerbations ([place])  J44.1       3. Stage 2 moderate COPD by GOLD classification ([place]) with intolerace (itch) to spiriva  J44.9       4. ILD (interstitial lung disease) ([place])  J84.9       5. History of 2019 novel coronavirus disease (COVID-19)  Z86.16       6. Rheumatoid factor positive  R76.8       7. Eosinophilia, unspecified type  D72.10           Glad you are better with time and BREZTRI and back  at [facility]  exercising     Plan  - do RAST allergy blood test 2 weeks after last dose of steroids which was fdew days ago              - CMA to make sure there is order  -Continue  BREZTRI 2 puff twice daily  - further step up threapies include consideration of roflumilast or biologics against eosinophils or clinical trials -  hold off on rheum visit for now but will entertain based on course - do PFT In 6 months     Follow-up - Return to see Dr. Chase Caller in 6 months or sooner if needed      Was appointment offered to patient (explain)?  No, seen in office on 09/12/2021   Reason for call: She states she started having symptoms when weather changed, got warmer and damp.  Started yesterday with sob and wheezing.  Does not wear oxygen.  Most of the time her sats are in the 90's, today her sat is 95%.  Sob at rest, but worse with exertion.  Used nebulizer yesterday for the first time since before Thanksgiving.  She got good relief from the nebulizer and the rescue inhaler.  She is using the [place] and feels it is working really well.  She is coughing some with some clear mucous.  Denies any fever, chills or body aches.  She feels like she is beginning to have a COPD exacerbation and is requesting prednisone.  Dr. Chase Caller please advise.  Thank you.  (examples of things to ask: : When did symptoms start? Fever? Cough? Productive? Color to sputum? More sputum than usual? Wheezing? Have you needed increased oxygen? Are you taking your respiratory medications? What over the counter measures have you tried?)  Allergies  Allergen Reactions   Ezetimibe Shortness Of Breath   Statins Other (See Comments)    Increased LFT's   Spiriva [Tiotropium Bromide Monohydrate] Itching  Immunization History  Administered Date(s) Administered   Influenza Split 06/26/2009, 09/05/2010, 07/17/2014, 07/23/2015, 08/14/2017   Influenza Whole 06/27/2012   Influenza, High Dose Seasonal PF 08/25/2012, 08/11/2016,  08/14/2017   Influenza,inj,Quad PF,6+ Mos 07/17/2014, 07/23/2015, 07/27/2016, 11/01/2018   Pneumococcal Conjugate-13 07/17/2014   Pneumococcal Polysaccharide-23 12/12/2008, 06/27/2012   Pneumococcal-Unspecified 06/27/2012   Tdap 06/29/2006, 02/07/2015

## 2021-10-30 NOTE — Telephone Encounter (Signed)
Kelly Delgado, can you please advise since MR is not available today?

## 2021-10-30 NOTE — Telephone Encounter (Signed)
Called and spoke with patient. I attempted to find an OV for her this week but we do not have any openings. She does not want to wait until next week (when the next appt is). She wanted to see if I could send a message to MR for when he returns tomorrow. I advised her that I would. She is aware that if she gets any worse tonight to go to the ED.   MR, can you please advise? Thanks.

## 2021-10-30 NOTE — Telephone Encounter (Signed)
°  Yes definitely   Take prednisone 40 mg daily x 2 days, then 20mg  daily x 2 days, then 10mg  daily x 2 days, then 5mg  daily x 2 days and stop  AND Z pak   AND  Blood work shows allergy to dog dander and high eos - almost behaving like an asthmatic - does she have a new dog?. Is the flooring carpeted?  Also, go to ER if worse   Allergies  Allergen Reactions   Ezetimibe Shortness Of Breath   Statins Other (See Comments)    Increased LFT's   Spiriva [Tiotropium Bromide Monohydrate] Itching        Latest Reference Range & Units 08/08/07 10:25 06/07/13 03:24 09/09/13 07:41 10/08/14 18:50 11/24/16 10:25 08/06/21 09:29  Eosinophils Absolute 0.0 - 0.7 K/uL 0.1 0.0 0.6 0.7 0.0 1.2 (H)  (H): Data is abnormally high    Latest Reference Range & Units 09/23/21 12:33  Dog Dander IgE Class I kU/L 0.38 !  !: Data is abnormal

## 2021-10-31 MED ORDER — AZITHROMYCIN 250 MG PO TABS: ORAL_TABLET | ORAL | 0 refills | Status: AC

## 2021-10-31 MED ORDER — PREDNISONE 10 MG PO TABS: ORAL_TABLET | ORAL | 0 refills | Status: DC

## 2021-10-31 NOTE — Telephone Encounter (Signed)
Patient is aware of results and voiced her understanding.  Prednisone and zpak sent to preferred pharmacy.  Patient stated that she has one dog and her living room is carpet.  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

## 2021-11-05 ENCOUNTER — Other Ambulatory Visit: Payer: Self-pay | Admitting: Diagnostic Radiology

## 2021-11-05 ENCOUNTER — Ambulatory Visit
Admission: RE | Admit: 2021-11-05 | Discharge: 2021-11-05 | Disposition: A | Discharge status: Home / Self Care | Payer: Medicare Other | Source: Ambulatory Visit | Attending: Family Medicine | Admitting: Family Medicine

## 2021-11-05 DIAGNOSIS — C50919 Malignant neoplasm of unspecified site of unspecified female breast: Secondary | ICD-10-CM

## 2021-11-05 DIAGNOSIS — N6310 Unspecified lump in the right breast, unspecified quadrant: Secondary | ICD-10-CM

## 2021-11-05 DIAGNOSIS — N6311 Unspecified lump in the right breast, upper outer quadrant: Secondary | ICD-10-CM | POA: Diagnosis not present

## 2021-11-05 DIAGNOSIS — C50811 Malignant neoplasm of overlapping sites of right female breast: Secondary | ICD-10-CM | POA: Diagnosis not present

## 2021-11-05 DIAGNOSIS — Z170 Estrogen receptor positive status [ER+]: Secondary | ICD-10-CM | POA: Diagnosis not present

## 2021-11-05 HISTORY — DX: Malignant neoplasm of unspecified site of unspecified female breast: C50.919

## 2021-11-05 HISTORY — PX: BREAST BIOPSY: SHX20

## 2021-11-07 ENCOUNTER — Other Ambulatory Visit: Payer: Self-pay | Admitting: Family Medicine

## 2021-11-07 DIAGNOSIS — R921 Mammographic calcification found on diagnostic imaging of breast: Secondary | ICD-10-CM

## 2021-11-11 ENCOUNTER — Ambulatory Visit: Payer: Medicare Other

## 2021-11-11 ENCOUNTER — Encounter: Payer: Self-pay | Admitting: Acute Care

## 2021-11-11 ENCOUNTER — Telehealth: Payer: Self-pay | Admitting: Internal Medicine

## 2021-11-11 ENCOUNTER — Other Ambulatory Visit: Payer: Self-pay

## 2021-11-11 ENCOUNTER — Telehealth (INDEPENDENT_AMBULATORY_CARE_PROVIDER_SITE_OTHER): Payer: Medicare Other | Admitting: Acute Care

## 2021-11-11 ENCOUNTER — Ambulatory Visit (INDEPENDENT_AMBULATORY_CARE_PROVIDER_SITE_OTHER): Payer: Medicare Other

## 2021-11-11 DIAGNOSIS — J441 Chronic obstructive pulmonary disease with (acute) exacerbation: Secondary | ICD-10-CM | POA: Diagnosis not present

## 2021-11-11 MED ORDER — DOXYCYCLINE HYCLATE 100 MG PO TABS: 100.0000 mg | ORAL_TABLET | Freq: Two times a day (BID) | ORAL | 0 refills | Status: DC

## 2021-11-11 MED ORDER — PREDNISONE 10 MG PO TABS: ORAL_TABLET | ORAL | 0 refills | Status: DC

## 2021-11-11 NOTE — Progress Notes (Signed)
Virtual Visit via Telephone Note  I connected with Kelly Delgado on 11/11/21 at 11:00 AM EST by telephone and verified that I am speaking with the correct person using two identifiers.  Location: Patient: At Home  Provider: [facility], [place], [place], [address]    I discussed the limitations, risks, security and privacy concerns of performing an evaluation and management service by telephone and the availability of in person appointments. I also discussed with the patient that there may be a patient responsible charge related to this service. The patient expressed understanding and agreed to proceed.  Synopsis: 86 year old with COPD stage 4. Followed by Dr. Chase Caller.    History of Present Illness: Initial 10/30/2021 flare, given prednisone and z pack. She did initially get better, but them with stress of new diagnosis of breast cancer, she has had a relapse. She was scheduled to go and see a surgeon today, but had to cancel the appointment. She was exposed to her husband who was sick this weekend. She did a Covid test which was negative. She has been wheezing , and coughing. She states secretions are clear with a yellow tinge. She used her nebulizer this morning. Oxygen on RA was 92%. She is compliant with Breztri. She has used her rescue x 2 this morning. She used her nebs at 1 am and again at 7 am this morning. She denies any fever. She needs a CXR, which she can come into the office to get.  Addendum>> CXR was clear, treated for COPD exacerbation   Observations/Objective: Wheezing audibly over the phone  CXR  ordered for 11/11/2021>> patient to come to the office to have this done The heart size and mediastinal contours are within normal limits. Both lungs are clear. The visualized skeletal structures are stable.   IMPRESSION: No active cardiopulmonary disease.  Assessment and Plan: Prednisone taper; 10 mg tablets: 4 tabs x 2 days, 3 tabs x 2 days, 2 tabs x 2 days 1 tab x 2  days then stop.  Doxycycline 100 mg twice Daily for 7 days Activia yogurt daily  Sunblock if outside for a long period of time CXR today in the office >> I have called the patient with results >> NO acute cardiopulmonary disease, both lungs are clear I will call you with the results Follow up video visit in 2 weeks  If your oxygen saturations drop below 90% , seek emergency care.  Call if you get worse not better. Emmit Alexanders with your surgery .  Follow Up Instructions: Follow up video visit in 2 weeks  Call if you get worse not better. Emmit Alexanders with your surgery .   I discussed the assessment and treatment plan with the patient. The patient was provided an opportunity to ask questions and all were answered. The patient agreed with the plan and demonstrated an understanding of the instructions.   The patient was advised to call back or seek an in-person evaluation if the symptoms worsen or if the condition fails to improve as anticipated.  I provided 30 minutes of non-face-to-face time during this encounter.   Magdalen Spatz, NP  11/11/2021

## 2021-11-11 NOTE — Telephone Encounter (Signed)
Called and spoke with patient. She stated that the prednisone and zpak MR prescribed on 10/30/21 did help with her symptoms but they have since returned. She has noticed that the SOB and cough has returned. She is now noticing that when she gets stressed out, her breathing tends to get worse. She was recently told that a spot on her right breast was cancerous. She had an appt with a surgeon today but had to cancel it due to her breathing concerns.   I was able to get her scheduled for a video visit with Judson Roch today at 11am. She verbalized understanding of instructions.   Nothing further needed at time of call.

## 2021-11-11 NOTE — Patient Instructions (Signed)
I'm sorry you are not feeling well.  Prednisone taper; 10 mg tablets: 4 tabs x 2 days, 3 tabs x 2 days, 2 tabs x 2 days 1 tab x 2 days then stop.  Doxycycline 100 mg twice Daily for 7 days Activia yogurt daily  Sunblock if outside for a long period of time CXR today in the office >> I have called the patient with results >> NO acute cardiopulmonary disease, both lungs are clear I will call you with the results Follow up video visit in 2 weeks  If your oxygen saturations drop below 90% , seek emergency care.  Call if you get worse not better. Kelly Delgado with your surgery . Please contact office for sooner follow up if symptoms do not improve or worsen or seek emergency care    .

## 2021-11-12 ENCOUNTER — Other Ambulatory Visit: Payer: Medicare Other

## 2021-11-18 ENCOUNTER — Other Ambulatory Visit: Payer: Medicare Other

## 2021-11-20 ENCOUNTER — Ambulatory Visit
Admission: RE | Admit: 2021-11-20 | Discharge: 2021-11-20 | Disposition: A | Discharge status: Home / Self Care | Payer: Medicare Other | Source: Ambulatory Visit | Attending: Family Medicine | Admitting: Family Medicine

## 2021-11-20 ENCOUNTER — Other Ambulatory Visit: Payer: Self-pay | Admitting: Family Medicine

## 2021-11-20 DIAGNOSIS — R921 Mammographic calcification found on diagnostic imaging of breast: Secondary | ICD-10-CM

## 2021-11-22 ENCOUNTER — Ambulatory Visit: Payer: Self-pay | Admitting: Surgery

## 2021-11-22 DIAGNOSIS — C50911 Malignant neoplasm of unspecified site of right female breast: Secondary | ICD-10-CM

## 2021-11-22 NOTE — H&P (View-Only) (Signed)
°Subjective  ° °Chief Complaint: Breast Cancer (Right IDC) °  ° ° °History of Present Illness: °Kelly Delgado is a 85 y.o. female who is seen today as an office consultation at the request of Dr. Wharton for evaluation of Breast Cancer (Right IDC) °.   °This is a pleasant 85-year-old female with COPD who presents after recent screening mammogram revealed a right breast mass.  Further work-up revealed a 0.5 x 0.5 x 1.1 cm mass at 12:00 5 cm from the nipple.  Nearby in the right upper outer quadrant she has a 0.7 cm area of calcifications.  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  The patient developed a hematoma after the initial biopsy.  The additional area of calcifications has not yet been biopsied.  She is scheduled for biopsy in 1 week.  The 2 areas are only about 6 mm apart. ° °She has a brother who passed away at age 43 from metastatic breast cancer.  It was felt that his cancer started in area of injury in his chest where he was struck by a baseball as a teenager. ° °The patient is getting over a recent COPD flare and is now off of her steroids. ° ° °Review of Systems: °A complete review of systems was obtained from the patient.  I have reviewed this information and discussed as appropriate with the patient.  See HPI as well for other ROS. ° °Review of Systems  °Constitutional: Negative.   °HENT: Positive for congestion.   °Eyes: Negative.   °Respiratory: Positive for cough.   °Cardiovascular: Negative.   °Gastrointestinal: Negative.   °Genitourinary: Negative.   °Musculoskeletal: Negative.   °Skin: Negative.   °Neurological: Negative.   °Endo/Heme/Allergies: Bruises/bleeds easily.  °Psychiatric/Behavioral: The patient is nervous/anxious.   °  ° ° °Medical History: °Past Medical History:  °Diagnosis Date  ° COPD (chronic obstructive pulmonary disease) (CMS-HCC)   ° ° °Patient Active Problem List  °Diagnosis  ° Benign essential  hypertension  ° Coronary artery calcification  ° Gastro-esophageal reflux disease without esophagitis  ° Hyperlipidemia  ° Stage 2 moderate COPD by GOLD classification , unspecified (CMS-HCC)  ° ° °Past Surgical History:  °Procedure Laterality Date  ° hip pinning    ° HYSTERECTOMY    °  ° °Allergies  °Allergen Reactions  ° Tiotropium Bromide Itching  ° ° °Current Outpatient Medications on File Prior to Visit  °Medication Sig Dispense Refill  ° albuterol 90 mcg/actuation inhaler 2 puffs    ° BREZTRI AEROSPHERE 160-9-4.8 mcg/actuation inhaler INHALE 2 PUFFS INTO THE LUNGS IN THE MORNING AND AT BEDTIME.    ° °No current facility-administered medications on file prior to visit.  ° ° °Family History  °Problem Relation Age of Onset  ° Skin cancer Mother   ° High blood pressure (Hypertension) Mother   ° Breast cancer Brother   °  ° °Social History  ° °Tobacco Use  °Smoking Status Former  ° Types: Cigarettes  °Smokeless Tobacco Never  °  ° °Social History  ° °Socioeconomic History  ° Marital status: Married  °Tobacco Use  ° Smoking status: Former  °  Types: Cigarettes  ° Smokeless tobacco: Never  °Vaping Use  ° Vaping Use: Never used  °Substance and Sexual Activity  ° Alcohol use: Yes  ° Drug use: Never  ° ° °Objective:  ° ° °Vitals:  ° 11/22/21 1006  °BP: 128/84  °Pulse: 84  °Temp: 36.8 °  C (98.2 °F)  °SpO2: 98%  °Weight: 83.9 kg (185 lb)  °Height: 171.5 cm (5' 7.5")  °  °Body mass index is 28.55 kg/m². ° °Physical Exam  ° °Constitutional:  WDWN in NAD, conversant, no obvious deformities; lying in bed comfortably °Eyes:  Pupils equal, round; sclera anicteric; moist conjunctiva; no lid lag °HENT:  Oral mucosa moist; good dentition  °Neck:  No masses palpated, trachea midline; no thyromegaly °Lungs:  CTA bilaterally; normal respiratory effort °Breasts:  symmetric, no nipple changes; no palpable masses or lymphadenopathy on the left.  No right axillary lymphadenopathy.  The right upper central breast shows some resolving  ecchymosis with a small palpable hematoma.  No other masses palpated.   °CV:  Regular rate and rhythm; no murmurs; extremities well-perfused with no edema °Abd:  +bowel sounds, soft, non-tender, no palpable organomegaly; no palpable hernias °Musc:  Unable to assess gait; no apparent clubbing or cyanosis in extremities °Lymphatic:  No palpable cervical or axillary lymphadenopathy °Skin:  Warm, dry; no sign of jaundice °Psychiatric - alert and oriented x 4; calm mood and affect ° ° °Labs, Imaging and Diagnostic Testing: °Diagnosis °Breast, right, needle core biopsy, Mass 12:00 o'clock °- INVASIVE DUCTAL CARCINOMA WITH EXTRACELLULAR MUCIN °- DUCTAL CARCINOMA IN SITU °- SEE COMMENT °Microscopic Comment °The overall lesion has a papillary features. Based on the biopsy, the carcinoma appears Nottingham grade 2 of 3 and °measures 0.5 cm in greatest linear extent. Prognostic markers (ER/PR/ki-67/HER2) are pending and will be reported in °an addendum. Dr. Picklesimer reviewed the case and agrees with the above diagnosis. These results were called to °[facility] on November 06, 2021. °DAWN BUTLER MD °Pathologist, Electronic Signature °(Case signed 11/06/2021) ° °GROUP 5: HER2 **NEGATIVE** °Equivocal form of amplification of the HER2 gene was detected in the IHC 2+ tissue sample received from this °individual. HER2 FISH was performed by a technologist and cell imaging and analysis on the [employer]. °RATIO OF HER2/CEN17 SIGNALS 1.28 °AVERAGE HER2 COPY NUMBER PER CELL 1.85 °The ratio of HER2/CEN 17 is within the range < 2.0 of HER2/CEN 17 and a copy number of HER2 signals per cell is <4.0. °DAWN BUTLER MD °Pathologist, Electronic Signature °( Signed 11/12/2021) °PROGNOSTIC INDICATORS °Results: °IMMUNOHISTOCHEMICAL AND MORPHOMETRIC ANALYSIS PERFORMED MANUALLY °The tumor cells are EQUIVOCAL for Her2 (2+). Her2 by FISH will be performed and results reported separately. °Estrogen Receptor: 100%, POSITIVE, STRONG  STAINING INTENSITY °Progesterone Receptor: 100%, POSITIVE, STRONG STAINING INTENSITY °Proliferation Marker Ki67: 1% ° ° °CLINICAL DATA:  Screening. °  °EXAM: °DIGITAL SCREENING BILATERAL MAMMOGRAM WITH TOMOSYNTHESIS AND CAD °  °TECHNIQUE: °Bilateral screening digital craniocaudal and mediolateral oblique °mammograms were obtained. Bilateral screening digital breast °tomosynthesis was performed. The images were evaluated with °computer-aided detection. °  °COMPARISON:  Previous exam(s). °  °ACR Breast Density Category b: There are scattered areas of °fibroglandular density. °  °FINDINGS: °In the right breast, a possible focal asymmetry and separate °calcifications warrant further evaluation. In the left breast, no °findings suspicious for malignancy. °  °IMPRESSION: °Further evaluation is suggested for possible focal asymmetry and °calcifications in the right breast. °  °RECOMMENDATION: °Diagnostic mammogram and possibly ultrasound of the right breast. °(Code:[id number]) °  °The patient will be contacted regarding the findings, and additional °imaging will be scheduled. °  °BI-RADS CATEGORY  0: Incomplete. Need additional imaging evaluation °and/or prior mammograms for comparison. °  °  °Electronically Signed °  By: Stephanie  Peacock M.D. °  On: 10/07/2021 09:06 ° °CLINICAL DATA:    85-year-old female for further evaluation of °possible RIGHT breast asymmetry and new RIGHT breast calcifications °on screening mammogram. °  °EXAM: °DIGITAL DIAGNOSTIC UNILATERAL RIGHT MAMMOGRAM WITH TOMOSYNTHESIS AND °CAD; ULTRASOUND RIGHT BREAST LIMITED °  °TECHNIQUE: °Right digital diagnostic mammography and breast tomosynthesis was °performed. The images were evaluated with computer-aided detection.; °Targeted ultrasound examination of the right breast was performed °  °COMPARISON:  Previous exam(s). °  °ACR Breast Density Category b: There are scattered areas of °fibroglandular density. °  °FINDINGS: °Full field, spot compression and  magnification views of the RIGHT °breast are performed. °  °A persistent focal asymmetry is identified within the UPPER RIGHT °breast, middle depth. °  °A 0.7 cm loose group of 4-5 coarse calcifications is identified °within the slightly UPPER OUTER RIGHT breast. °  °Targeted ultrasound of the RIGHT breast is performed, showing a 0.5 °x 0.5 x 1.1 cm partially circumscribed partially indistinct °heterogeneous mass with possible cystic spaces at the 12 o'clock °position of the RIGHT breast 5 cm from the nipple, corresponding to °the mammographic asymmetry. °  °No abnormal appearing RIGHT axillary lymph nodes are noted. °  °IMPRESSION: °1. Indeterminate 1.1 cm UPPER RIGHT breast mass. Tissue sampling is °recommended. °2. 0.7 cm group of likely benign UPPER-OUTER RIGHT breast °calcifications. If biopsy of the UPPER RIGHT breast mass °demonstrates atypia or malignancy, then 3D/stereotactic biopsy of °these calcifications is recommended as these are new. Otherwise °six-month follow-up is recommended. °3. No abnormal appearing RIGHT axillary lymph nodes. °  °RECOMMENDATION: °Ultrasound-guided biopsy of UPPER RIGHT breast mass, which will be °scheduled. °  °RIGHT diagnostic mammogram with magnification views in 6 months of °UPPER-OUTER RIGHT breast calcifications. If UPPER RIGHT breast mass °biopsy demonstrates atypia or malignancy, then 3D/stereotactic °biopsy of these calcifications would be recommended. °  °I have discussed the findings and recommendations with the patient. °If applicable, a reminder letter will be sent to the patient °regarding the next appointment. °  °BI-RADS CATEGORY  4: Suspicious. °  °  °Electronically Signed °  By: Jeffrey  Hu M.D. °  On: 10/25/2021 12:10 °  ° °Assessment and Plan:  °Diagnoses and all orders for this visit: ° °Invasive ductal carcinoma of breast, female, right (CMS-HCC) °-     Ambulatory Referral to Oncology-Medical °-     Ambulatory Referral to Radiation Oncology ° °  °We spent  approximately 45 minutes with the patient discussing her findings and her pathology report.  The biopsy of the additional area of calcifications is still pending but this area is very adjacent to the diagnosed area of invasive ductal carcinoma/DCIS.  It is likely that this will all be taken with a lumpectomy. ° °We discussed surgical options for treatment including mastectomy versus breast conserving therapy.  We discussed the likely course of treatment will be lumpectomy without sentinel lymph node biopsy followed by radiation and antiestrogens.  We discussed the rationale for not pursuing a sentinel lymph node biopsy due to the characteristics of her tumor and also her age demographic.  She is in agreement with this plan.  We will schedule her surgery to occur after her next biopsy is complete. ° °No follow-ups on file. ° °Minh Roanhorse KAI Lina Hitch, MD  °11/22/2021 °11:15 AM ° °

## 2021-11-22 NOTE — H&P (Signed)
Subjective   Chief Complaint: Breast Cancer (Right IDC)     History of Present Illness: Kelly Delgado is a 86 y.o. female who is seen today as an office consultation at the request of Dr. Rolland Porter for evaluation of Breast Cancer (Right IDC) .   This is a pleasant 86 year old female with COPD who presents after recent screening mammogram revealed a right breast mass.  Further work-up revealed a 0.5 x 0.5 x 1.1 cm mass at 12:00 5 cm from the nipple.  Nearby in the right upper outer quadrant she has a 0.7 cm area of calcifications.  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  The patient developed a hematoma after the initial biopsy.  The additional area of calcifications has not yet been biopsied.  She is scheduled for biopsy in 1 week.  The 2 areas are only about 6 mm apart.  She has a brother who passed away at age 58 from metastatic breast cancer.  It was felt that his cancer started in area of injury in his chest where he was struck by a baseball as a teenager.  The patient is getting over a recent COPD flare and is now off of her steroids.   Review of Systems: A complete review of systems was obtained from the patient.  I have reviewed this information and discussed as appropriate with the patient.  See HPI as well for other ROS.  Review of Systems  Constitutional: Negative.   HENT: Positive for congestion.   Eyes: Negative.   Respiratory: Positive for cough.   Cardiovascular: Negative.   Gastrointestinal: Negative.   Genitourinary: Negative.   Musculoskeletal: Negative.   Skin: Negative.   Neurological: Negative.   Endo/Heme/Allergies: Bruises/bleeds easily.  Psychiatric/Behavioral: The patient is nervous/anxious.       Medical History: Past Medical History:  Diagnosis Date   COPD (chronic obstructive pulmonary disease) (CMS-HCC)     Patient Active Problem List  Diagnosis   Benign essential  hypertension   Coronary artery calcification   Gastro-esophageal reflux disease without esophagitis   Hyperlipidemia   Stage 2 moderate COPD by GOLD classification , unspecified (CMS-HCC)    Past Surgical History:  Procedure Laterality Date   hip pinning     HYSTERECTOMY       Allergies  Allergen Reactions   Tiotropium Bromide Itching    Current Outpatient Medications on File Prior to Visit  Medication Sig Dispense Refill   albuterol 90 mcg/actuation inhaler 2 puffs     BREZTRI AEROSPHERE 160-9-4.8 mcg/actuation inhaler INHALE 2 PUFFS INTO THE LUNGS IN THE MORNING AND AT BEDTIME.     No current facility-administered medications on file prior to visit.    Family History  Problem Relation Age of Onset   Skin cancer Mother    High blood pressure (Hypertension) Mother    Breast cancer Brother      Social History   Tobacco Use  Smoking Status Former   Types: Cigarettes  Smokeless Tobacco Never     Social History   Socioeconomic History   Marital status: Married  Tobacco Use   Smoking status: Former    Types: Cigarettes   Smokeless tobacco: Never  Vaping Use   Vaping Use: Never used  Substance and Sexual Activity   Alcohol use: Yes   Drug use: Never    Objective:    Vitals:   11/22/21 1006  BP: 128/84  Pulse: 84  Temp: 36.8  C (98.2 F)  SpO2: 98%  Weight: 83.9 kg (185 lb)  Height: 171.5 cm (5' 7.5")    Body mass index is 28.55 kg/m.  Physical Exam   Constitutional:  WDWN in NAD, conversant, no obvious deformities; lying in bed comfortably Eyes:  Pupils equal, round; sclera anicteric; moist conjunctiva; no lid lag HENT:  Oral mucosa moist; good dentition  Neck:  No masses palpated, trachea midline; no thyromegaly Lungs:  CTA bilaterally; normal respiratory effort Breasts:  symmetric, no nipple changes; no palpable masses or lymphadenopathy on the left.  No right axillary lymphadenopathy.  The right upper central breast shows some resolving  ecchymosis with a small palpable hematoma.  No other masses palpated.   CV:  Regular rate and rhythm; no murmurs; extremities well-perfused with no edema Abd:  +bowel sounds, soft, non-tender, no palpable organomegaly; no palpable hernias Musc:  Unable to assess gait; no apparent clubbing or cyanosis in extremities Lymphatic:  No palpable cervical or axillary lymphadenopathy Skin:  Warm, dry; no sign of jaundice Psychiatric - alert and oriented x 4; calm mood and affect   Labs, Imaging and Diagnostic Testing: Diagnosis Breast, right, needle core biopsy, Mass 12:00 o'clock - INVASIVE DUCTAL CARCINOMA WITH EXTRACELLULAR MUCIN - DUCTAL CARCINOMA IN SITU - SEE COMMENT Microscopic Comment The overall lesion has a papillary features. Based on the biopsy, the carcinoma appears Nottingham grade 2 of 3 and measures 0.5 cm in greatest linear extent. Prognostic markers (ER/PR/ki-67/HER2) are pending and will be reported in an addendum. Dr. Alric Seton reviewed the case and agrees with the above diagnosis. These results were called to [name] on November 06, 2021. Thressa Sheller MD Pathologist, Electronic Signature (Case signed 11/06/2021)  GROUP 5: HER2 **NEGATIVE** Equivocal form of amplification of the HER2 gene was detected in the IHC 2+ tissue sample received from this individual. HER2 FISH was performed by a technologist and cell imaging and analysis on the [employer]. RATIO OF HER2/CEN17 SIGNALS 1.28 AVERAGE HER2 COPY NUMBER PER CELL 1.85 The ratio of HER2/CEN 17 is within the range < 2.0 of HER2/CEN 17 and a copy number of HER2 signals per cell is <4.0. Thressa Sheller MD Pathologist, Electronic Signature ( Signed 11/12/2021) PROGNOSTIC INDICATORS Results: IMMUNOHISTOCHEMICAL AND MORPHOMETRIC ANALYSIS PERFORMED MANUALLY The tumor cells are EQUIVOCAL for Her2 (2+). Her2 by FISH will be performed and results reported separately. Estrogen Receptor: 100%, POSITIVE, STRONG  STAINING INTENSITY Progesterone Receptor: 100%, POSITIVE, STRONG STAINING INTENSITY Proliferation Marker Ki67: 1%   CLINICAL DATA:  Screening.   EXAM: DIGITAL SCREENING BILATERAL MAMMOGRAM WITH TOMOSYNTHESIS AND CAD   TECHNIQUE: Bilateral screening digital craniocaudal and mediolateral oblique mammograms were obtained. Bilateral screening digital breast tomosynthesis was performed. The images were evaluated with computer-aided detection.   COMPARISON:  Previous exam(s).   ACR Breast Density Category b: There are scattered areas of fibroglandular density.   FINDINGS: In the right breast, a possible focal asymmetry and separate calcifications warrant further evaluation. In the left breast, no findings suspicious for malignancy.   IMPRESSION: Further evaluation is suggested for possible focal asymmetry and calcifications in the right breast.   RECOMMENDATION: Diagnostic mammogram and possibly ultrasound of the right breast. (Code:[id number]-37M)   The patient will be contacted regarding the findings, and additional imaging will be scheduled.   BI-RADS CATEGORY  0: Incomplete. Need additional imaging evaluation and/or prior mammograms for comparison.     Electronically Signed   By: Valentino Saxon M.D.   On: 10/07/2021 09:06  CLINICAL DATA:  86 year old female for further evaluation of possible RIGHT breast asymmetry and new RIGHT breast calcifications on screening mammogram.   EXAM: DIGITAL DIAGNOSTIC UNILATERAL RIGHT MAMMOGRAM WITH TOMOSYNTHESIS AND CAD; ULTRASOUND RIGHT BREAST LIMITED   TECHNIQUE: Right digital diagnostic mammography and breast tomosynthesis was performed. The images were evaluated with computer-aided detection.; Targeted ultrasound examination of the right breast was performed   COMPARISON:  Previous exam(s).   ACR Breast Density Category b: There are scattered areas of fibroglandular density.   FINDINGS: Full field, spot compression and  magnification views of the RIGHT breast are performed.   A persistent focal asymmetry is identified within the UPPER RIGHT breast, middle depth.   A 0.7 cm loose group of 4-5 coarse calcifications is identified within the slightly UPPER OUTER RIGHT breast.   Targeted ultrasound of the RIGHT breast is performed, showing a 0.5 x 0.5 x 1.1 cm partially circumscribed partially indistinct heterogeneous mass with possible cystic spaces at the 12 o'clock position of the RIGHT breast 5 cm from the nipple, corresponding to the mammographic asymmetry.   No abnormal appearing RIGHT axillary lymph nodes are noted.   IMPRESSION: 1. Indeterminate 1.1 cm UPPER RIGHT breast mass. Tissue sampling is recommended. 2. 0.7 cm group of likely benign UPPER-OUTER RIGHT breast calcifications. If biopsy of the UPPER RIGHT breast mass demonstrates atypia or malignancy, then 3D/stereotactic biopsy of these calcifications is recommended as these are new. Otherwise six-month follow-up is recommended. 3. No abnormal appearing RIGHT axillary lymph nodes.   RECOMMENDATION: Ultrasound-guided biopsy of UPPER RIGHT breast mass, which will be scheduled.   RIGHT diagnostic mammogram with magnification views in 6 months of UPPER-OUTER RIGHT breast calcifications. If UPPER RIGHT breast mass biopsy demonstrates atypia or malignancy, then 3D/stereotactic biopsy of these calcifications would be recommended.   I have discussed the findings and recommendations with the patient. If applicable, a reminder letter will be sent to the patient regarding the next appointment.   BI-RADS CATEGORY  4: Suspicious.     Electronically Signed   By: Margarette Canada M.D.   On: 10/25/2021 12:10    Assessment and Plan:  Diagnoses and all orders for this visit:  Invasive ductal carcinoma of breast, female, right (CMS-HCC) -     Ambulatory Referral to Oncology-Medical -     Ambulatory Referral to Radiation Oncology    We spent  approximately 45 minutes with the patient discussing her findings and her pathology report.  The biopsy of the additional area of calcifications is still pending but this area is very adjacent to the diagnosed area of invasive ductal carcinoma/DCIS.  It is likely that this will all be taken with a lumpectomy.  We discussed surgical options for treatment including mastectomy versus breast conserving therapy.  We discussed the likely course of treatment will be lumpectomy without sentinel lymph node biopsy followed by radiation and antiestrogens.  We discussed the rationale for not pursuing a sentinel lymph node biopsy due to the characteristics of her tumor and also her age demographic.  She is in agreement with this plan.  We will schedule her surgery to occur after her next biopsy is complete.  No follow-ups on file.  Noelie Renfrow Jearld Adjutant, MD  11/22/2021 11:15 AM

## 2021-11-25 ENCOUNTER — Other Ambulatory Visit: Payer: Self-pay | Admitting: Surgery

## 2021-11-25 ENCOUNTER — Other Ambulatory Visit: Payer: Self-pay | Admitting: *Deleted

## 2021-11-25 DIAGNOSIS — C50411 Malignant neoplasm of upper-outer quadrant of right female breast: Secondary | ICD-10-CM | POA: Insufficient documentation

## 2021-11-25 DIAGNOSIS — C50412 Malignant neoplasm of upper-outer quadrant of left female breast: Secondary | ICD-10-CM

## 2021-11-25 DIAGNOSIS — C50911 Malignant neoplasm of unspecified site of right female breast: Secondary | ICD-10-CM

## 2021-11-26 ENCOUNTER — Telehealth: Payer: Self-pay | Admitting: Hematology and Oncology

## 2021-11-26 NOTE — Telephone Encounter (Signed)
Scheduled appt per 1/30 staff msg from nurse navigator. Spoke to pt who is aware of appt date and time. Pt is aware to arrive 15 mins prior to appt time.

## 2021-11-27 ENCOUNTER — Other Ambulatory Visit: Payer: Self-pay

## 2021-11-27 ENCOUNTER — Inpatient Hospital Stay: Payer: Medicare Other | Attending: Hematology and Oncology | Admitting: Hematology and Oncology

## 2021-11-27 ENCOUNTER — Encounter: Payer: Self-pay | Admitting: Acute Care

## 2021-11-27 ENCOUNTER — Encounter: Payer: Self-pay | Admitting: Hematology and Oncology

## 2021-11-27 ENCOUNTER — Other Ambulatory Visit: Payer: Self-pay | Admitting: Genetic Counselor

## 2021-11-27 ENCOUNTER — Encounter: Payer: Self-pay | Admitting: *Deleted

## 2021-11-27 ENCOUNTER — Ambulatory Visit (HOSPITAL_BASED_OUTPATIENT_CLINIC_OR_DEPARTMENT_OTHER): Payer: Medicare Other | Admitting: Genetic Counselor

## 2021-11-27 ENCOUNTER — Ambulatory Visit: Payer: Medicare Other | Admitting: Acute Care

## 2021-11-27 ENCOUNTER — Inpatient Hospital Stay: Payer: Medicare Other

## 2021-11-27 ENCOUNTER — Encounter: Payer: Self-pay | Admitting: Genetic Counselor

## 2021-11-27 VITALS — BP 120/100 | HR 101 | Temp 97.9°F | Ht 67.5 in | Wt 184.4 lb

## 2021-11-27 DIAGNOSIS — Z803 Family history of malignant neoplasm of breast: Secondary | ICD-10-CM | POA: Diagnosis not present

## 2021-11-27 DIAGNOSIS — C50412 Malignant neoplasm of upper-outer quadrant of left female breast: Secondary | ICD-10-CM | POA: Diagnosis not present

## 2021-11-27 DIAGNOSIS — C50911 Malignant neoplasm of unspecified site of right female breast: Secondary | ICD-10-CM | POA: Diagnosis not present

## 2021-11-27 DIAGNOSIS — Z87891 Personal history of nicotine dependence: Secondary | ICD-10-CM | POA: Insufficient documentation

## 2021-11-27 DIAGNOSIS — J441 Chronic obstructive pulmonary disease with (acute) exacerbation: Secondary | ICD-10-CM | POA: Diagnosis not present

## 2021-11-27 DIAGNOSIS — C50411 Malignant neoplasm of upper-outer quadrant of right female breast: Secondary | ICD-10-CM | POA: Diagnosis not present

## 2021-11-27 DIAGNOSIS — Z90710 Acquired absence of both cervix and uterus: Secondary | ICD-10-CM | POA: Diagnosis not present

## 2021-11-27 DIAGNOSIS — I10 Essential (primary) hypertension: Secondary | ICD-10-CM | POA: Insufficient documentation

## 2021-11-27 DIAGNOSIS — Z170 Estrogen receptor positive status [ER+]: Secondary | ICD-10-CM

## 2021-11-27 DIAGNOSIS — J449 Chronic obstructive pulmonary disease, unspecified: Secondary | ICD-10-CM | POA: Diagnosis not present

## 2021-11-27 LAB — GENETIC SCREENING ORDER

## 2021-11-27 MED ORDER — ALBUTEROL SULFATE HFA 108 (90 BASE) MCG/ACT IN AERS: 2.0000 | INHALATION_SPRAY | Freq: Four times a day (QID) | RESPIRATORY_TRACT | 3 refills | Status: DC | PRN

## 2021-11-27 NOTE — Progress Notes (Signed)
New Breast Cancer Diagnosis: Right Breast  Did patient present with symptoms (if so, please note symptoms) or screening mammography?:Screening Mass   Location and Extent of disease :right breast. Located at 12 o'clock position, measured  0.5 x 0.5 x 1.1 cm in greatest dimension.  Nearby in the right upper outer quadrant she has a 0.7 cm area of calcifications.  Adenopathy no.  Histology per Pathology Report: grade 2, Invasive Ductal Carcinoma 11/05/2021   Receptor Status: ER(positive), PR (positive), Her2-neu (negative), Ki-(1%)  Surgeon and surgical plan, if any: Dr. Georgette Dover -Right Breast Lumpectomy with radioactive seed localization 12/05/2021  -Second biopsy 11/29/2021   Medical oncologist, treatment if any:     Family History of Breast/Ovarian/Prostate Cancer: Brother had metastatic breast cancer, 2 Maternal Cousin had breast cancer, Maternal Aunt had breast cancer, Dad had Prostate Cancer   Lymphedema issues, if any: No     Pain issues, if any: Has some bruising from her biopsy.     SAFETY ISSUES: Prior radiation? No Pacemaker/ICD? No Possible current pregnancy? Hysterectomy Is the patient on methotrexate? No  Current Complaints / other details:

## 2021-11-27 NOTE — Pre-Procedure Instructions (Signed)
Surgical Instructions    Your procedure is scheduled on Thursday, February 9th.  Report to [facility] Main Entrance "A" at 08:00 A.M., then check in with the Admitting office.  Call this number if you have problems the morning of surgery:  [phone]   If you have any questions prior to your surgery date call [phone]: Open Monday-Friday 8am-4pm    Remember:  Do not eat after midnight the night before your surgery  You may drink clear liquids until 07:00 AM the morning of your surgery.   Clear liquids allowed are: Water, Non-Citrus Juices (without pulp), Carbonated Beverages, Clear Tea, Black Coffee Only (NO MILK, CREAM OR POWDERED CREAMER of any kind), and Gatorade.    Take these medicines the morning of surgery with A SIP OF WATER  Budeson-Glycopyrrol-Formoterol (BREZTRI AEROSPHERE)   If needed: albuterol (PROVENTIL) nebulizer albuterol (VENTOLIN HFA)- if you need this, bring it with you on the day of surgery  fluticasone (FLONASE)   As of today, STOP taking any Aspirin (unless otherwise instructed by your surgeon) Aleve, Naproxen, Ibuprofen, Motrin, Advil, Goody's, BC's, all herbal medications, fish oil, and all vitamins.                     Do NOT Smoke (Tobacco/Vaping) for 24 hours prior to your procedure.  If you use a CPAP at night, you may bring your mask/headgear for your overnight stay.   Contacts, glasses, piercing's, hearing aid's, dentures or partials may not be worn into surgery, please bring cases for these belongings.    For patients admitted to the hospital, discharge time will be determined by your treatment team.   Patients discharged the day of surgery will not be allowed to drive home, and someone needs to stay with them for 24 hours.  NO VISITORS WILL BE ALLOWED IN PRE-OP WHERE PATIENTS ARE PREPPED FOR SURGERY.  ONLY 1 SUPPORT PERSON MAY BE PRESENT IN THE WAITING ROOM WHILE YOU ARE IN SURGERY.  IF YOU ARE TO BE ADMITTED, ONCE YOU ARE IN YOUR ROOM YOU  WILL BE ALLOWED TWO (2) VISITORS. (1) VISITOR MAY STAY OVERNIGHT BUT MUST ARRIVE TO THE ROOM BY 8pm.  Minor children may have two parents present. Special consideration for safety and communication needs will be reviewed on a case by case basis.   Special instructions:   [facility]- Preparing For Surgery  Before surgery, you can play an important role. Because skin is not sterile, your skin needs to be as free of germs as possible. You can reduce the number of germs on your skin by washing with CHG (chlorahexidine gluconate) Soap before surgery.  CHG is an antiseptic cleaner which kills germs and bonds with the skin to continue killing germs even after washing.    Oral Hygiene is also important to reduce your risk of infection.  Remember - BRUSH YOUR TEETH THE MORNING OF SURGERY WITH YOUR REGULAR TOOTHPASTE  Please do not use if you have an allergy to CHG or antibacterial soaps. If your skin becomes reddened/irritated stop using the CHG.  Do not shave (including legs and underarms) for at least 48 hours prior to first CHG shower. It is OK to shave your face.  Please follow these instructions carefully.   Shower the NIGHT BEFORE SURGERY and the MORNING OF SURGERY  If you chose to wash your hair, wash your hair first as usual with your normal shampoo.  After you shampoo, rinse your hair and body thoroughly to remove the shampoo.  Use CHG Soap as you would any other liquid soap. You can apply CHG directly to the skin and wash gently with a scrungie or a clean washcloth.   Apply the CHG Soap to your body ONLY FROM THE NECK DOWN.  Do not use on open wounds or open sores. Avoid contact with your eyes, ears, mouth and genitals (private parts). Wash Face and genitals (private parts)  with your normal soap.   Wash thoroughly, paying special attention to the area where your surgery will be performed.  Thoroughly rinse your body with warm water from the neck down.  DO NOT shower/wash with your  normal soap after using and rinsing off the CHG Soap.  Pat yourself dry with a CLEAN TOWEL.  Wear CLEAN PAJAMAS to bed the night before surgery  Place CLEAN SHEETS on your bed the night before your surgery  DO NOT SLEEP WITH PETS.   Day of Surgery: Shower with CHG soap. Do not wear jewelry, make up, nail polish, gel polish, artificial nails, or any other type of covering on natural nails including finger and toenails. If patients have artificial nails, gel coating, etc. that need to be removed by a nail salon please have this removed prior to surgery. Surgery may need to be canceled/delayed if the surgeon/anesthesiologist feels like the patient is unable to be adequately monitored. Do not wear lotions, powders, perfumes, or deodorant. Do not shave 48 hours prior to surgery.   Do not bring valuables to the hospital. [facility] is not responsible for any belongings or valuables. Wear Clean/Comfortable clothing the morning of surgery Remember to brush your teeth WITH YOUR REGULAR TOOTHPASTE.   Please read over the following fact sheets that you were given.   3 days prior to your procedure or After your COVID test   You are not required to quarantine however you are required to wear a well-fitting mask when you are out and around people not in your household. If your mask becomes wet or soiled, replace with a new one.   Wash your hands often with soap and water for 20 seconds or clean your hands with an alcohol-based hand sanitizer that contains at least 60% alcohol.   Do not share personal items.   Notify your provider:  o if you are in close contact with someone who has COVID  o or if you develop a fever of 100.4 or greater, sneezing, cough, sore throat, shortness of breath or body aches.

## 2021-11-27 NOTE — Assessment & Plan Note (Signed)
Blood pressure today was very high.  She does not have any evidence of hypertensive urgency or emergency.  She currently does not take any antihypertensive medication.  Apparently she was taken off of medications several years ago and record her blood pressure daily at home and her systolic never exceeds 373 and diastolic is usually under 90.  She tells me that she is very nervous coming to the cancer center.

## 2021-11-27 NOTE — Patient Instructions (Addendum)
It is good to see you today. I am glad you feel better.  Continue your Judithann Sauger as you have been doing. Two puffs twice daily. Rinse mouth after use. Continue using nebulizer treatments as needed.  Follow up in 3 months with Judson Roch NP.  Call if you need [place] sooner.  Good luck with your upcoming surgery.  Please contact office for sooner follow up if symptoms do not improve or worsen or seek emergency care

## 2021-11-27 NOTE — Progress Notes (Signed)
REFERRING PROVIDER: Benay Pike, MD [place],   [id number]  PRIMARY PROVIDER:  Marda Stalker, PA-C  PRIMARY REASON FOR VISIT:  1. Malignant neoplasm of upper-outer quadrant of left breast in female, estrogen receptor positive ([place])   2. Family history of breast cancer in female     HISTORY OF PRESENT ILLNESS:   Ms. Bobrowski, a 86 y.o. female, was seen for a [place] cancer genetics consultation at the request of Dr. Chryl Heck due to a personal and family history of breast cancer.  Ms. Acre presents to clinic today to discuss the possibility of a hereditary predisposition to cancer, to discuss genetic testing, and to further clarify her future cancer risks, as well as potential cancer risks for family members.   In January 2023, at the age of 80, Ms. Siebels was diagnosed with invasive ductal carcinoma of the right breast (ER+/PR+/HER2-). The preliminary treatment plan includes breast conserving surgery, adjuvant radiation, and anti-estrogens.   CANCER HISTORY:  Oncology History   No history exists.    Past Medical History:  Diagnosis Date   Asthmatic bronchitis    Basal cell carcinoma of breast 1990's?   "left side, burned it off" (06/07/2013)   GERD (gastroesophageal reflux disease)    H/O hiatal hernia    Hypertension    Migraines    "once a year usually" (06/07/2013)   Schatzki's ring    Shortness of breath    "related to asthmatic bronchitis only" (06/07/2013)    Past Surgical History:  Procedure Laterality Date   ABDOMINAL HYSTERECTOMY  06/28/1979   BREAST BIOPSY Right 11/05/2021   CATARACT EXTRACTION W/ INTRAOCULAR LENS  IMPLANT, BILATERAL Bilateral 06/27/1989   DILATION AND CURETTAGE OF UTERUS  06/28/1979   "1" (06/07/2013)   ESOPHAGEAL DILATION  10/27/2010   "just once" (06/07/2013)   HIP PINNING,CANNULATED Right 10/09/2014   Procedure: CANNULATED HIP PINNING;  Surgeon: Renette Butters, MD;  Location: [facility];  Service: Orthopedics;   Laterality: Right;   URETHRAL DIVERTICULUM REPAIR  06/27/1969    FAMILY HISTORY:  We obtained a detailed, 4-generation family history.  Significant diagnoses are listed below: Family History  Problem Relation Age of Onset   Cancer Maternal Aunt        unknown type; dx after 51   Leukemia Maternal Uncle    Breast cancer Half-Brother 23   Breast cancer Cousin        maternal female cousin; dx 51s   Breast cancer Cousin        maternal female cousin; dx 10s     Ms. Sukhu is unaware of previous family history of genetic testing for hereditary cancer risks. There is no reported Ashkenazi Jewish ancestry. There is no known consanguinity.  GENETIC COUNSELING ASSESSMENT: Ms. Rosenau is a 86 y.o. female with a personal and family history of cancer which is somewhat suggestive of a hereditary cancer syndrome and predisposition to cancer given the presence of breast cancer in multiple maternal relatives, including her brother before age 11. We, therefore, discussed and recommended the following at today's visit.   DISCUSSION: We discussed that 5 - 10% of cancer is hereditary.  Most cases of hereditary breast cancer are associated with mutations in BRCA1/2.  There are other genes that can be associated with hereditary breast cancer syndromes.  We discussed that testing is beneficial for several reasons including knowing how to follow individuals for their cancer risks, identifying whether potential treatment options would be beneficial, and understanding if other family  members could be at risk for cancer and allowing them to undergo genetic testing.   We reviewed the characteristics, features and inheritance patterns of hereditary cancer syndromes. We also discussed genetic testing, including the appropriate family members to test, the process of testing, insurance coverage and turn-around-time for results. We discussed the implications of a negative, positive, carrier and/or variant of uncertain  significant result. We recommended Ms. Duchesne pursue genetic testing for a panel that includes genes associated with breast cancer.   The CustomNext-Cancer+RNAinsight panel offered by [facility] includes sequencing and rearrangement analysis for the following 47 genes:  APC, ATM, AXIN2, BARD1, BMPR1A, BRCA1, BRCA2, BRIP1, CDH1, CDK4, CDKN2A, CHEK2, DICER1, EPCAM, GREM1, HOXB13, MEN1, MLH1, MSH2, MSH3, MSH6, MUTYH, NBN, NF1, NF2, NTHL1, PALB2, PMS2, POLD1, POLE, PTEN, RAD51C, RAD51D, RECQL, RET, SDHA, SDHAF2, SDHB, SDHC, SDHD, SMAD4, SMARCA4, STK11, TP53, TSC1, TSC2, and VHL.  RNA data is routinely analyzed for use in variant interpretation for all genes.  Based on Ms. Sollers's personal history of breast cancer and family history of female breast cancer in her maternal half brother, she meets medical criteria for genetic testing. Despite that she meets criteria, she may still have an out of pocket cost. We discussed that if her out of pocket cost for testing is over $100, the laboratory should contact her and discuss the self-pay prices and/or patient pay assistance programs.    PLAN: After considering the risks, benefits, and limitations, Ms. Boch provided informed consent to pursue genetic testing and the blood sample was sent to [facility] for analysis of the CustomNext-Cancer +RNAinsight Panel. Results should be available within approximately 3 weeks' time, at which point they will be disclosed by telephone to Ms. Rissler, as will any additional recommendations warranted by these results. Ms. Tallie will receive a summary of her genetic counseling visit and a copy of her results once available. This information will also be available in Epic.   Lastly, we encouraged Ms. Davidovich to remain in contact with cancer genetics annually so that we can continuously update the family history and inform her of any changes in cancer genetics and testing that may be of benefit for this family.    Ms. Deahl questions were answered to her satisfaction today. Our contact information was provided should additional questions or concerns arise. Thank you for the referral and allowing [place] to share in the care of your patient.   Thalya Fouche M. Joette Catching, [place], [facility] Genetic Counselor [email or web address] (P) [phone]  The patient was seen for a total of 30 minutes in face-to-face genetic counseling.  The patient was accompanied by her husband, Philippa Chester. Drs. Lindi Adie and/or Burr Medico were available to discuss this case as needed.   ______________________________________________________________________ For Office Staff:  Number of people involved in session: 2 Was an Intern/ student involved with case: no

## 2021-11-27 NOTE — Progress Notes (Signed)
History of Present Illness Kelly Delgado is a 86 y.o. female with COPD stage 4. She is followed by Dr. Marchelle Gearing Maintenance Breztri Albuterol nebs prn    11/27/2021 Pt. Returns for follow up. She was seen 11/11/2021 for a COPD flare. She had also just recently been diagnosed with breast cancer. We treated her with Doxycycline and a prednisone taper.  She states she completed the Doxycycline x 7 days and prednisone taper. Pt. States she feels much better. She does have an occasional cough, but this is much better. She states secretions have cleared up. She denies any fevers , chest pain . She does not have any dyspnea. She has a lumpectomy scheduled for her breast cancer next week. ( 12/05/2021) . She states she does have occasional wheezing which usually clears with cough. She denies fever, chest pain, orthopnea or hemoptysis.   Test Results: 11/11/2021 CXR The heart size and mediastinal contours are within normal limits. Both lungs are clear. The visualized skeletal structures are stable.   IMPRESSION: No active cardiopulmonary disease.  CBC Latest Ref Rng & Units 08/06/2021 07/17/2017 11/24/2016  WBC 4.0 - 10.5 K/uL 6.7 9.0 4.1  Hemoglobin 12.0 - 15.0 g/dL 16.4 67.2 59.1  Hematocrit 36.0 - 46.0 % 41.1 42.9 39.9  Platelets 150.0 - 400.0 K/uL 166.0 197 130(L)    BMP Latest Ref Rng & Units 07/17/2017 11/24/2016 11/24/2016  Glucose 65 - 99 mg/dL 264(G) - 552(F)  BUN 6 - 20 mg/dL 20 - 17  Creatinine 5.89 - 1.00 mg/dL 4.23 6.61 7.35  Sodium 135 - 145 mmol/L 137 - 134(L)  Potassium 3.5 - 5.1 mmol/L 4.1 - 3.8  Chloride 101 - 111 mmol/L 104 - 100(L)  CO2 22 - 32 mmol/L 24 - 22  Calcium 8.9 - 10.3 mg/dL 9.3 - 8.8(L)    BNP No results found for: BNP  ProBNP No results found for: PROBNP  PFT    Component Value Date/Time   FEV1PRE 1.31 09/12/2021 1053   FEV1POST 1.71 09/12/2021 1053   FVCPRE 1.92 09/12/2021 1053   FVCPOST 2.28 09/12/2021 1053   TLC 4.51 09/12/2021 1053   DLCOUNC  17.02 09/12/2021 1053   PREFEV1FVCRT 68 09/12/2021 1053   PSTFEV1FVCRT 75 09/12/2021 1053    DG Chest 2 View  Result Date: 11/11/2021 CLINICAL DATA:  COPD exacerbation EXAM: CHEST - 2 VIEW COMPARISON:  October 28, 2018 FINDINGS: The heart size and mediastinal contours are within normal limits. Both lungs are clear. The visualized skeletal structures are stable. IMPRESSION: No active cardiopulmonary disease. Electronically Signed   By: Sherian Rein M.D.   On: 11/11/2021 15:17   MM CLIP PLACEMENT RIGHT  Result Date: 11/05/2021 CLINICAL DATA:  Status post ultrasound-guided biopsy right breast mass. EXAM: 3D DIAGNOSTIC RIGHT MAMMOGRAM POST ULTRASOUND BIOPSY COMPARISON:  Previous exam(s). FINDINGS: 3D Mammographic images were obtained following ultrasound guided biopsy of right breast mass. The biopsy marking clip is in expected position at the site of biopsy. IMPRESSION: Appropriate positioning of the ribbon shaped biopsy marking clip at the site of biopsy in the superior right breast. Final Assessment: Post Procedure Mammograms for Marker Placement Electronically Signed   By: Annia Belt M.D.   On: 11/05/2021 14:50  [place] RT BREAST BX W LOC DEV 1ST LESION IMG BX SPEC US GUIDE  Addendum Date: 11/25/2021   ADDENDUM REPORT: 11/25/2021 08:02 ADDENDUM: Correction: the patient is rescheduled to re-attempt stereotactic biopsy on 11/29/2021 01:45 p.m. Electronically Signed   By: Rhona Raider.D.  On: 11/25/2021 08:02   Addendum Date: 11/20/2021   ADDENDUM REPORT: 11/20/2021 11:38 ADDENDUM: The patient return for stereotactic biopsy of additional right breast calcifications on 11/20/2021. She had a large hematoma with a painful lump involving the upper-outer quadrant of the right breast as well as significant black and blue bruising involving the entire upper outer and lateral right breast. We attempted the positioning her within the machine for stereotactic biopsy, but she could not tolerate positioning due  to marked pain from compression of the hematoma. We will reschedule her to re-attempt biopsy at a later date once the hematoma has better resolved. She is scheduled to return on Friday 11/18/2038 5 p.m. to re-attempt stereotactic biopsy of right breast calcifications. Electronically Signed   By: Sande Brothers M.D.   On: 11/20/2021 11:38   Addendum Date: 11/08/2021   ADDENDUM REPORT: 11/08/2021 13:26 ADDENDUM: Pathology revealed GRADE II INVASIVE DUCTAL CARCINOMA WITH EXTRACELLULAR MUCIN, DUCTAL CARCINOMA IN SITU of the RIGHT breast, 12:00 o'clock, (ribbon clip). The overall lesion has papillary features. This was found to be concordant by Dr. Annia Belt. Pathology results were discussed with the patient by telephone. The patient reported doing well after the biopsy with tenderness, bruising and swelling at the site. Post biopsy instructions and care were reviewed and questions were answered. The patient was encouraged to call [facility] for any additional concerns. My direct phone number was provided. Surgical consultation has been arranged with Dr. Harriette Bouillon at [facility] on November 11, 2021. The patient is scheduled for a RIGHT breast stereotatic guided biopsy on November 20, 2021. Further recommendations will be guided by the results of this biopsy. Pathology results reported by Rene Kocher, RN on 11/07/2021. Electronically Signed   By: Annia Belt M.D.   On: 11/08/2021 13:26   Result Date: 11/25/2021 CLINICAL DATA:  Patient with indeterminate right breast mass 12 o'clock position. EXAM: ULTRASOUND GUIDED RIGHT BREAST CORE NEEDLE BIOPSY COMPARISON:  Previous exam(s). PROCEDURE: I met with the patient and we discussed the procedure of ultrasound-guided biopsy, including benefits and alternatives. We discussed the high likelihood of a successful procedure. We discussed the risks of the procedure, including infection, bleeding, tissue injury, clip migration, and  inadequate sampling. Informed written consent was given. The usual time-out protocol was performed immediately prior to the procedure. Lesion quadrant: Upper outer quadrant Using sterile technique and 1% Lidocaine as local anesthetic, under direct ultrasound visualization, a 14 gauge spring-loaded device was used to perform biopsy of right breast mass 12 o'clock position using a lateral approach. At the conclusion of the procedure ribbon shaped tissue marker clip was deployed into the biopsy cavity. Follow up 2 view mammogram was performed and dictated separately. IMPRESSION: Ultrasound guided biopsy of right breast mass 12 o'clock position. No apparent complications. Electronically Signed: By: Annia Belt M.D. On: 11/05/2021 14:49     Past medical hx Past Medical History:  Diagnosis Date   Asthmatic bronchitis    Basal cell carcinoma of breast 1990's?   "left side, burned it off" (06/07/2013)   GERD (gastroesophageal reflux disease)    H/O hiatal hernia    Hypertension    Migraines    "once a year usually" (06/07/2013)   Schatzki's ring    Shortness of breath    "related to asthmatic bronchitis only" (06/07/2013)     Social History   Tobacco Use   Smoking status: Former    Packs/day: 0.33    Years: 13.00  Pack years: 4.29    Types: Cigarettes    Quit date: 11/27/1968    Years since quitting: 53.0   Smokeless tobacco: Never  Vaping Use   Vaping Use: Never used  Substance Use Topics   Alcohol use: Yes   Drug use: No    Ms.Bound reports that she quit smoking about 53 years ago. Her smoking use included cigarettes. She has a 4.29 pack-year smoking history. She has never used smokeless tobacco. She reports current alcohol use. She reports that she does not use drugs.  Tobacco Cessation: Former smoker , Quit 1970 with a 4.29 pack year smoking history    Past surgical hx, Family hx, Social hx all reviewed.  Current Outpatient Medications on File Prior to Visit  Medication Sig    albuterol (PROVENTIL) (2.5 MG/3ML) 0.083% nebulizer solution Take 74mL by nebulization every 6 hours as needed for wheezing or SOB   albuterol (VENTOLIN HFA) 108 (90 Base) MCG/ACT inhaler INHALE 2 PUFFS INTO LUNGS EVERY 6 HRS AS NEEDED FOR WHEEZING/SHORTNESS OF BREATH *NEEDS APPOINTMENT   Ascorbic Acid (VITAMIN C PO) Take 1 tablet by mouth daily.   Budeson-Glycopyrrol-Formoterol (BREZTRI AEROSPHERE) 160-9-4.8 MCG/ACT AERO Inhale 2 puffs into the lungs in the morning and at bedtime.   Calcium Carb-Cholecalciferol (CALCIUM 600 + D PO) Take 1 tablet by mouth every other day.    Cholecalciferol (VITAMIN D-3 PO) Take 1 capsule by mouth daily.   fluticasone (FLONASE) 50 MCG/ACT nasal spray Place 1 spray into both nostrils daily as needed for allergies or rhinitis.   hydrochlorothiazide (HYDRODIURIL) 25 MG tablet Take 25 mg by mouth daily as needed (swelling).   Multiple Vitamin (MULTIVITAMIN WITH MINERALS) TABS tablet Take 1 tablet by mouth daily.   No current facility-administered medications on file prior to visit.     Allergies  Allergen Reactions   Ezetimibe Shortness Of Breath   Statins Other (See Comments)    Increased LFT's   Alendronate Sodium     felt sick   Ibandronic Acid     felt sick   Spiriva [Tiotropium Bromide Monohydrate] Itching    Review Of Systems:  Constitutional:   No  weight loss, night sweats,  Fevers, chills, fatigue, or  lassitude.  HEENT:   No headaches,  Difficulty swallowing,  Tooth/dental problems, or  Sore throat,                No sneezing, itching, ear ache, nasal congestion, post nasal drip,   CV:  No chest pain,  Orthopnea, PND, swelling in lower extremities, anasarca, dizziness, palpitations, syncope.   GI  No heartburn, indigestion, abdominal pain, nausea, vomiting, diarrhea, change in bowel habits, loss of appetite, bloody stools.   Resp: No shortness of breath with exertion or at rest.  + excess mucus, no productive cough,  + non-productive  cough,  No coughing up of blood.  No change in color of mucus.  Rare wheezing.  No chest wall deformity  Skin: no rash or lesions.  GU: no dysuria, change in color of urine, no urgency or frequency.  No flank pain, no hematuria   MS:  No joint pain or swelling.  No decreased range of motion.  No back pain.  Psych:  No change in mood or affect. No depression  + anxiety ( recent breast cancer diagnosis) .  No memory loss.   Vital Signs BP (!) 120/100 (BP Location: Right Arm, Cuff Size: Large)    Pulse (!) 101    SpO2 96%  Physical Exam:  General- No distress,  A&Ox3, pleasant ENT: No sinus tenderness, TM clear, pale nasal mucosa, no oral exudate,no post nasal drip, no LAN Cardiac: S1, S2, regular rate and rhythm, no murmur Chest: No wheeze/ rales/ dullness; no accessory muscle use, no nasal flaring, no sternal retractions Abd.: Soft Non-tender, ND, BS +, Body mass index is 28.45 kg/m.  Ext: No clubbing cyanosis, edema Neuro:  normal strength, MAE x 4, A&O x 3, appropriate Skin: No rashes, warm and dry, no lesions Psych: normal mood and behavior   Assessment/Plan Resolved COPD Flare Plan It is good to see you today. I am glad you feel better.  Continue your Judithann Sauger as you have been doing. Two puffs twice daily. Rinse mouth after use. Continue using nebulizer treatments as needed.  Follow up in 3 months with Judson Roch NP.  Call if you need [place] sooner.  Good luck with your upcoming surgery.  Please contact office for sooner follow up if symptoms do not improve or worsen or seek emergency care     I spent 30 minutes dedicated to the care of this patient on the date of this encounter to include pre-visit review of records, face-to-face time with the patient discussing conditions above, post visit ordering of testing, clinical documentation with the electronic health record, making appropriate referrals as documented, and communicating necessary information to the patient's  healthcare team.   Magdalen Spatz, NP 11/27/2021  3:20 PM

## 2021-11-27 NOTE — Progress Notes (Signed)
[place] NOTE  Patient Care Team: Marda Stalker, PA-C as PCP - General (Family Medicine) Brand Males, MD as Consulting Physician (Pulmonary Disease)  CHIEF COMPLAINTS/PURPOSE OF CONSULTATION:  Newly diagnosed breast cancer  HISTORY OF PRESENTING ILLNESS:  Kelly Delgado 86 y.o. female is here because of recent diagnosis of right breast IDC  This is a pleasant 86 year old female patient with past medical history of COPD, hypertension who presented after an abnormal screening mammogram. 10/04/2021 Screening mammogram, possible focal asymmetry and calcifications in right breast Indeterminate 1.1 cm upper right breast mass, tissue sampling is recommended. 0.7 cm group of likely benign UPPER-OUTER RIGHT breast calcifications. If biopsy of the UPPER RIGHT breast mass demonstrates atypia or malignancy, then 3D/stereotactic biopsy of these calcifications is recommended as these are new. Otherwise six-month follow-up is recommended. No abnormal appearing RIGHT axillary lymph nodes. US guided biopsy was recommended. US guided breast biopsy showed IDC with extracellular mucin, DCIS. Prognostics show ER 100 % positive, strong staining intensity, PR 100 % positive, strong staining intensity KI1 %, Her 2 neg,  I reviewed her records extensively and collaborated the history with the patient.  SUMMARY OF ONCOLOGIC HISTORY: Oncology History   No history exists.     MEDICAL HISTORY:  Past Medical History:  Diagnosis Date   Asthmatic bronchitis    Basal cell carcinoma of breast 1990's?   "left side, burned it off" (06/07/2013)   GERD (gastroesophageal reflux disease)    H/O hiatal hernia    Hypertension    Migraines    "once a year usually" (06/07/2013)   Schatzki's ring    Shortness of breath    "related to asthmatic bronchitis only" (06/07/2013)    SURGICAL HISTORY: Past Surgical History:  Procedure Laterality Date   ABDOMINAL HYSTERECTOMY  06/28/1979    BREAST BIOPSY Right 11/05/2021   CATARACT EXTRACTION W/ INTRAOCULAR LENS  IMPLANT, BILATERAL Bilateral 06/27/1989   DILATION AND CURETTAGE OF UTERUS  06/28/1979   "1" (06/07/2013)   ESOPHAGEAL DILATION  10/27/2010   "just once" (06/07/2013)   HIP PINNING,CANNULATED Right 10/09/2014   Procedure: CANNULATED HIP PINNING;  Surgeon: Renette Butters, MD;  Location: [facility];  Service: Orthopedics;  Laterality: Right;   URETHRAL DIVERTICULUM REPAIR  06/27/1969    SOCIAL HISTORY: Social History   Socioeconomic History   Marital status: Married    Spouse name: Not on file   Number of children: Not on file   Years of education: Not on file   Highest education level: Not on file  Occupational History   Not on file  Tobacco Use   Smoking status: Former    Packs/day: 0.33    Years: 13.00    Pack years: 4.29    Types: Cigarettes    Quit date: 11/27/1968    Years since quitting: 53.0   Smokeless tobacco: Never  Vaping Use   Vaping Use: Never used  Substance and Sexual Activity   Alcohol use: Yes   Drug use: No   Sexual activity: Yes  Other Topics Concern   Not on file  Social History Narrative   Not on file   Social Determinants of Health   Financial Resource Strain: Not on file  Food Insecurity: Not on file  Transportation Needs: Not on file  Physical Activity: Not on file  Stress: Not on file  Social Connections: Not on file  Intimate Partner Violence: Not on file    FAMILY HISTORY: Family History  Problem Relation Age of Onset  Cancer Mother    Stroke Mother    Hypertension Mother     ALLERGIES:  is allergic to ezetimibe, statins, alendronate sodium, ibandronic acid, and spiriva [tiotropium bromide monohydrate].  MEDICATIONS:  Current Outpatient Medications  Medication Sig Dispense Refill   albuterol (PROVENTIL) (2.5 MG/3ML) 0.083% nebulizer solution Take 74m by nebulization every 6 hours as needed for wheezing or SOB 75 mL 6   albuterol (VENTOLIN HFA) 108 (90 Base)  MCG/ACT inhaler INHALE 2 PUFFS INTO LUNGS EVERY 6 HRS AS NEEDED FOR WHEEZING/SHORTNESS OF BREATH *NEEDS APPOINTMENT 18 each 3   Ascorbic Acid (VITAMIN C PO) Take 1 tablet by mouth daily.     Budeson-Glycopyrrol-Formoterol (BREZTRI AEROSPHERE) 160-9-4.8 MCG/ACT AERO Inhale 2 puffs into the lungs in the morning and at bedtime. 10.7 g 2   Calcium Carb-Cholecalciferol (CALCIUM 600 + D PO) Take 1 tablet by mouth every other day.      Cholecalciferol (VITAMIN D-3 PO) Take 1 capsule by mouth daily.     fluticasone (FLONASE) 50 MCG/ACT nasal spray Place 1 spray into both nostrils daily as needed for allergies or rhinitis.     hydrochlorothiazide (HYDRODIURIL) 25 MG tablet Take 25 mg by mouth daily as needed (swelling).     Multiple Vitamin (MULTIVITAMIN WITH MINERALS) TABS tablet Take 1 tablet by mouth daily.     No current facility-administered medications for this visit.    REVIEW OF SYSTEMS:   Constitutional: Denies fevers, chills or abnormal night sweats Eyes: Denies blurriness of vision, double vision or watery eyes Ears, nose, mouth, throat, and face: Denies mucositis or sore throat Respiratory: Denies cough, dyspnea or wheezes Cardiovascular: Denies palpitation, chest discomfort or lower extremity swelling Gastrointestinal:  Denies nausea, heartburn or change in bowel habits Skin: Denies abnormal skin rashes Lymphatics: Denies new lymphadenopathy or easy bruising Neurological:Denies numbness, tingling or new weaknesses Behavioral/Psych: Mood is stable, no new changes  Breast: No breast changes reported until after the biopsy site. All other systems were reviewed with the patient and are negative.  PHYSICAL EXAMINATION: ECOG PERFORMANCE STATUS: 0 - Asymptomatic  Vitals:   11/27/21 1107  BP: (!) 193/88  Pulse: 75  Resp: 18  Temp: (!) 97.5 F (36.4 C)  SpO2: 97%   Filed Weights   11/27/21 1107  Weight: 183 lb 14.4 oz (83.4 kg)    GENERAL:alert, no distress and  comfortable SKIN: skin color, texture, turgor are normal, no rashes or significant lesions EYES: normal, conjunctiva are pink and non-injected, sclera clear OROPHARYNX:no exudate, no erythema and lips, buccal mucosa, and tongue normal  NECK: supple, thyroid normal size, non-tender, without nodularity LYMPH:  no palpable lymphadenopathy in the cervical, axillary or inguinal LUNGS: clear to auscultation and percussion with normal breathing effort HEART: regular rate & rhythm and no murmurs and no lower extremity edema ABDOMEN:abdomen soft, non-tender and normal bowel sounds Musculoskeletal:no cyanosis of digits and no clubbing  PSYCH: alert & oriented x 3 with fluent speech NEURO: no focal motor/sensory deficits BREAST:post biopsy changes noted. There is palpable firmness at the biopsy site not sure if this is related to biopsy changes or from breast mass.  LABORATORY DATA:  I have reviewed the data as listed Lab Results  Component Value Date   WBC 6.7 08/06/2021   HGB 13.6 08/06/2021   HCT 41.1 08/06/2021   MCV 89.1 08/06/2021   PLT 166.0 08/06/2021   Lab Results  Component Value Date   NA 137 07/17/2017   K 4.1 07/17/2017   CL 104 07/17/2017  CO2 24 07/17/2017    RADIOGRAPHIC STUDIES: I have personally reviewed the radiological reports and agreed with the findings in the report.  ASSESSMENT AND PLAN:  Malignant neoplasm of upper-outer quadrant of left breast in female, estrogen receptor positive ([place]) This is a very pleasant 86 year old female patient with past medical history significant for COPD, hypertension referred to medical oncology for new diagnosis of right breast invasive ductal carcinoma noted on a screening mammogram.  Patient denies any palpable lumps prior to this.  She denies any abnormal mammograms except several years ago when she was asked to come back for another mammogram.  She does have family history of her half brother who died from metastatic breast  cancer at the age of 35.  Physical examination today, palpable firmness in the biopsy site which patient mentions that it was not present before the biopsy.  We have discussed about proceeding with lumpectomy first since the tumor is small and is strongly ER/PR positive and HER2 negative followed by adjuvant radiation and adjuvant endocrine therapy.  I do not believe Oncotype testing is necessary in this patient unless final pathology is different.  We have talked about different options of antiestrogen therapy including tamoxifen and aromatase inhibitors.  We discussed tamoxifen as a selective estrogen receptor modulator.  We have discussed about mechanism of action in detail, adverse effects of tamoxifen including but not limited to postmenopausal symptoms, increased risk of DVT/PE, increased risk of endometrial cancer, cardiovascular events.  Benefit from tamoxifen would be improvement in bone density.  She has osteoporosis at baseline. We have also discussed about aromatase inhibitors, mechanism of action, adverse effects from aromatase inhibitors including but not limited to postmenopausal symptoms, arthralgias/myalgias, bone loss. Patient expressed understanding of the recommendations.  She will return to clinic after adjuvant radiation.  I do not believe she will benefit from adjuvant chemotherapy unless the final biopsy has a different grade or prognostics. We will plan to see her about 2 to 3 weeks after surgery  Family history of breast cancer in female Her half brother died from metastatic breast cancer at the age of 70.  Genetic testing recommended today.  Essential hypertension Blood pressure today was very high.  She does not have any evidence of hypertensive urgency or emergency.  She currently does not take any antihypertensive medication.  Apparently she was taken off of medications several years ago and record her blood pressure daily at home and her systolic never exceeds 950 and diastolic  is usually under 90.  She tells me that she is very nervous coming to the cancer center.   All questions were answered. The patient knows to call the clinic with any problems, questions or concerns.    Benay Pike, MD 11/27/21

## 2021-11-27 NOTE — Assessment & Plan Note (Signed)
This is a very pleasant 86 year old female patient with past medical history significant for COPD, hypertension referred to medical oncology for new diagnosis of right breast invasive ductal carcinoma noted on a screening mammogram.  Patient denies any palpable lumps prior to this.  She denies any abnormal mammograms except several years ago when she was asked to come back for another mammogram.  She does have family history of her half brother who died from metastatic breast cancer at the age of 10.  Physical examination today, palpable firmness in the biopsy site which patient mentions that it was not present before the biopsy.  We have discussed about proceeding with lumpectomy first since the tumor is small and is strongly ER/PR positive and HER2 negative followed by adjuvant radiation and adjuvant endocrine therapy.  I do not believe Oncotype testing is necessary in this patient unless final pathology is different.  We have talked about different options of antiestrogen therapy including tamoxifen and aromatase inhibitors.  We discussed tamoxifen as a selective estrogen receptor modulator.  We have discussed about mechanism of action in detail, adverse effects of tamoxifen including but not limited to postmenopausal symptoms, increased risk of DVT/PE, increased risk of endometrial cancer, cardiovascular events.  Benefit from tamoxifen would be improvement in bone density.  She has osteoporosis at baseline. We have also discussed about aromatase inhibitors, mechanism of action, adverse effects from aromatase inhibitors including but not limited to postmenopausal symptoms, arthralgias/myalgias, bone loss. Patient expressed understanding of the recommendations.  She will return to clinic after adjuvant radiation.  I do not believe she will benefit from adjuvant chemotherapy unless the final biopsy has a different grade or prognostics. We will plan to see her about 2 to 3 weeks after surgery

## 2021-11-27 NOTE — Assessment & Plan Note (Signed)
Her half brother died from metastatic breast cancer at the age of 37.  Genetic testing recommended today.

## 2021-11-27 NOTE — Progress Notes (Addendum)
Radiation Oncology         [phone]) [phone] ________________________________  Name: Kelly Delgado        MRN: [id number]  Date of Service: 11/28/2021 DOB: November 29, 1935  TM:[id number], Donnie Aho, MD     REFERRING PHYSICIAN: Donnie Mesa, MD   DIAGNOSIS: The encounter diagnosis was Malignant neoplasm of upper-outer quadrant of left breast in female, estrogen receptor positive ([place]).   HISTORY OF PRESENT ILLNESS: Kelly Delgado is a 86 y.o. female seen in the multidisciplinary breast clinic for a new diagnosis of left breast cancer. The patient was noted to have  abnormality in the right breast diagnostic imaging showed a 7 mm area of coarse calcific lesions in the upper outer right breast and targeted ultrasound showed mass with possible cystic change in the 12 o'clock position of the right breast.  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  She has met with Dr. Georgette Dover and right lumpectomy on 3.  She also met with Dr. Chryl Heck and met with genetics yesterday also.  She is seen today via MyChart to discuss treatment recommendations to include radiotherapy.Marland Kitchen    PREVIOUS RADIATION THERAPY: No   PAST MEDICAL HISTORY:  Past Medical History:  Diagnosis Date   Asthmatic bronchitis    Basal cell carcinoma of breast 1990's?   "left side, burned it off" (06/07/2013)   GERD (gastroesophageal reflux disease)    H/O hiatal hernia    Hypertension    Migraines    "once a year usually" (06/07/2013)   Schatzki's ring    Shortness of breath    "related to asthmatic bronchitis only" (06/07/2013)       PAST SURGICAL HISTORY: Past Surgical History:  Procedure Laterality Date   ABDOMINAL HYSTERECTOMY  06/28/1979   BREAST BIOPSY Right 11/05/2021   CATARACT EXTRACTION W/  INTRAOCULAR LENS  IMPLANT, BILATERAL Bilateral 06/27/1989   DILATION AND CURETTAGE OF UTERUS  06/28/1979   "1" (06/07/2013)   ESOPHAGEAL DILATION  10/27/2010   "just once" (06/07/2013)   HIP PINNING,CANNULATED Right 10/09/2014   Procedure: CANNULATED HIP PINNING;  Surgeon: Renette Butters, MD;  Location: [facility];  Service: Orthopedics;  Laterality: Right;   URETHRAL DIVERTICULUM REPAIR  06/27/1969     FAMILY HISTORY:  Family History  Problem Relation Age of Onset   Cancer Mother    Stroke Mother    Hypertension Mother    Cancer Maternal Aunt        unknown type; dx after 64   Leukemia Maternal Uncle    Breast cancer Half-Brother 30   Breast cancer Cousin        maternal female cousin; dx 12s   Breast cancer Cousin        maternal female cousin; dx 41s     SOCIAL HISTORY:  reports that she quit smoking about 53 years ago. Her smoking use included cigarettes. She has a 4.29 pack-year smoking history. She has never used smokeless tobacco. She reports current alcohol use. She reports that she does not use drugs.  The patient is married and lives in [place].  She is retired and enjoys spending time at home, traveling to [employer] and traveling to [place] to see family.   ALLERGIES: Ezetimibe, Statins, Alendronate sodium, Ibandronic acid, and Spiriva [tiotropium bromide monohydrate]  MEDICATIONS:  Current Outpatient Medications  Medication Sig Dispense Refill   albuterol (PROVENTIL) (2.5 MG/3ML) 0.083% nebulizer solution Take 36m by nebulization every 6 hours as needed for wheezing or SOB 75 mL 6   albuterol (VENTOLIN HFA) 108 (90 Base) MCG/ACT inhaler INHALE 2 PUFFS INTO LUNGS EVERY 6 HRS AS NEEDED FOR WHEEZING/SHORTNESS OF BREATH *NEEDS APPOINTMENT 18 each 3   Ascorbic Acid (VITAMIN C PO) Take 1 tablet by mouth daily.     Budeson-Glycopyrrol-Formoterol (BREZTRI AEROSPHERE) 160-9-4.8 MCG/ACT AERO Inhale 2 puffs into the lungs in the morning and at bedtime. 10.7 g 2   Calcium  Carb-Cholecalciferol (CALCIUM 600 + D PO) Take 1 tablet by mouth every other day.      Cholecalciferol (VITAMIN D-3 PO) Take 1 capsule by mouth daily.     fluticasone (FLONASE) 50 MCG/ACT nasal spray Place 1 spray into both nostrils daily as needed for allergies or rhinitis.     hydrochlorothiazide (HYDRODIURIL) 25 MG tablet Take 25 mg by mouth daily as needed (swelling).     Multiple Vitamin (MULTIVITAMIN WITH MINERALS) TABS tablet Take 1 tablet by mouth daily.     No current facility-administered medications for this visit.     REVIEW OF SYSTEMS: On review of systems, the patient reports that she is doing well overall.  She denies any specific complaints of pain from her breast but does have some bruising. No other concerns are verbalized.     PHYSICAL EXAM:  Unable to assess vitals given encounter type.  In general this is a well appearing Caucasian female in no acute distress. She's alert and oriented x4 and appropriate throughout the examination. Cardiopulmonary assessment is negative for acute distress and she exhibits normal effort. Bilateral breast exam is deferred.    ECOG = 0  0 - Asymptomatic (Fully active, able to carry on all predisease activities without restriction)  1 - Symptomatic but completely ambulatory (Restricted in physically strenuous activity but ambulatory and able to carry out work of a light or sedentary nature. For example, light housework, office work)  2 - Symptomatic, <50% in bed during the day (Ambulatory and capable of all self care but unable to carry out any work activities. Up and about more than 50% of waking hours)  3 - Symptomatic, >50% in bed, but not bedbound (Capable of only limited self-care, confined to bed or chair 50% or more of waking hours)  4 - Bedbound (Completely disabled. Cannot carry on any self-care. Totally confined to bed or chair)  5 - Death   OEustace PenMM, Creech RH, Tormey DC, et al. [phone]. "Toxicity and response criteria of  the [facility]". ANew [place]Oncol. 5 (6): 649-55    LABORATORY DATA:  Lab Results  Component Value Date   WBC 6.7 08/06/2021   HGB 13.6 08/06/2021   HCT 41.1 08/06/2021   MCV 89.1 08/06/2021   PLT 166.0 08/06/2021   Lab Results  Component Value Date   NA 137 07/17/2017   K 4.1 07/17/2017   CL 104 07/17/2017   CO2 24 07/17/2017   Lab Results  Component Value Date   ALT 31 11/24/2016   AST 35 11/24/2016   ALKPHOS 205 (H) 11/24/2016   BILITOT 0.7 11/24/2016      RADIOGRAPHY: DG Chest 2 View  Result Date: 11/11/2021 CLINICAL DATA:  COPD exacerbation EXAM: CHEST - 2 VIEW COMPARISON:  October 28, 2018 FINDINGS: The heart size and mediastinal contours are within normal limits. Both lungs are  clear. The visualized skeletal structures are stable. IMPRESSION: No active cardiopulmonary disease. Electronically Signed   By: Abelardo Diesel M.D.   On: 11/11/2021 15:17   MM CLIP PLACEMENT RIGHT  Result Date: 11/05/2021 CLINICAL DATA:  Status post ultrasound-guided biopsy right breast mass. EXAM: 3D DIAGNOSTIC RIGHT MAMMOGRAM POST ULTRASOUND BIOPSY COMPARISON:  Previous exam(s). FINDINGS: 3D Mammographic images were obtained following ultrasound guided biopsy of right breast mass. The biopsy marking clip is in expected position at the site of biopsy. IMPRESSION: Appropriate positioning of the ribbon shaped biopsy marking clip at the site of biopsy in the superior right breast. Final Assessment: Post Procedure Mammograms for Marker Placement Electronically Signed   By: Lovey Newcomer M.D.   On: 11/05/2021 14:50  [place] RT BREAST BX W LOC DEV 1ST LESION IMG BX SPEC US GUIDE  Addendum Date: 11/25/2021   ADDENDUM REPORT: 11/25/2021 08:02 ADDENDUM: Correction: the patient is rescheduled to re-attempt stereotactic biopsy on 11/29/2021 01:45 p.m. Electronically Signed   By: Kristopher Oppenheim M.D.   On: 11/25/2021 08:02   Addendum Date: 11/20/2021   ADDENDUM REPORT: 11/20/2021 11:38  ADDENDUM: The patient return for stereotactic biopsy of additional right breast calcifications on 11/20/2021. She had a large hematoma with a painful lump involving the upper-outer quadrant of the right breast as well as significant black and blue bruising involving the entire upper outer and lateral right breast. We attempted the positioning her within the machine for stereotactic biopsy, but she could not tolerate positioning due to marked pain from compression of the hematoma. We will reschedule her to re-attempt biopsy at a later date once the hematoma has better resolved. She is scheduled to return on Friday 11/18/2038 5 p.m. to re-attempt stereotactic biopsy of right breast calcifications. Electronically Signed   By: Kristopher Oppenheim M.D.   On: 11/20/2021 11:38   Addendum Date: 11/08/2021   ADDENDUM REPORT: 11/08/2021 13:26 ADDENDUM: Pathology revealed GRADE II INVASIVE DUCTAL CARCINOMA WITH EXTRACELLULAR MUCIN, DUCTAL CARCINOMA IN SITU of the RIGHT breast, 12:00 o'clock, (ribbon clip). The overall lesion has papillary features. This was found to be concordant by Dr. Lovey Newcomer. Pathology results were discussed with the patient by telephone. The patient reported doing well after the biopsy with tenderness, bruising and swelling at the site. Post biopsy instructions and care were reviewed and questions were answered. The patient was encouraged to call [facility] for any additional concerns. My direct phone number was provided. Surgical consultation has been arranged with Dr. Erroll Luna at [facility] on November 11, 2021. The patient is scheduled for a RIGHT breast stereotatic guided biopsy on November 20, 2021. Further recommendations will be guided by the results of this biopsy. Pathology results reported by Terie Purser, RN on 11/07/2021. Electronically Signed   By: Lovey Newcomer M.D.   On: 11/08/2021 13:26   Result Date: 11/25/2021 CLINICAL DATA:  Patient with  indeterminate right breast mass 12 o'clock position. EXAM: ULTRASOUND GUIDED RIGHT BREAST CORE NEEDLE BIOPSY COMPARISON:  Previous exam(s). PROCEDURE: I met with the patient and we discussed the procedure of ultrasound-guided biopsy, including benefits and alternatives. We discussed the high likelihood of a successful procedure. We discussed the risks of the procedure, including infection, bleeding, tissue injury, clip migration, and inadequate sampling. Informed written consent was given. The usual time-out protocol was performed immediately prior to the procedure. Lesion quadrant: Upper outer quadrant Using sterile technique and 1% Lidocaine as local anesthetic, under direct ultrasound visualization, a 14 gauge  spring-loaded device was used to perform biopsy of right breast mass 12 o'clock position using a lateral approach. At the conclusion of the procedure ribbon shaped tissue marker clip was deployed into the biopsy cavity. Follow up 2 view mammogram was performed and dictated separately. IMPRESSION: Ultrasound guided biopsy of right breast mass 12 o'clock position. No apparent complications. Electronically Signed: By: Lovey Newcomer M.D. On: 11/05/2021 14:49       IMPRESSION/PLAN: 1. Stage IA, [id number] grade 2, ER/PR positive invasive ductal carcinoma of the right breast. Dr. Lisbeth Renshaw discusses the pathology findings and reviews the nature of early stage breast disease. The consensus from the breast conference includes breast conservation with lumpectomy. Dr. Lisbeth Renshaw discusses the rationale for external radiotherapy to the breast  to reduce risks of local recurrence followed by antiestrogen therapy. Dr. Lisbeth Renshaw also discusses cases in which radiation may be optional for favorable cases based on final pathology. We discussed the risks, benefits, short, and long term effects of radiotherapy, as well as the curative intent, and the patient is interested in proceeding with radiotherapy. Dr. Lisbeth Renshaw discusses the delivery  and logistics of radiotherapy and anticipates a course of 4 weeks of radiotherapy to the right breast. We will see her back a few weeks after surgery to discuss the simulation process and anticipate we starting radiotherapy about 4-6 weeks after surgery.  2. Possible genetic predisposition to malignancy. The patient is a candidate for genetic testing given her personal and family history. She has been seen by genetics and elected to have testing. She will receive results in the next few weeks.  This encounter was provided by telemedicine platform MyChart.  The patient has provided two factor identification and has given verbal consent for this type of encounter and has been advised to only accept a meeting of this type in a secure network environment. The time spent during this encounter was 60 minutes including preparation, discussion, and coordination of the patient's care. The attendants for this meeting include Blenda Nicely, RN, Dr. Lisbeth Renshaw, Hayden Pedro  and Aleeya Caren Macadam.  During the encounter,  Blenda Nicely, RN, Dr. Lisbeth Renshaw, and Hayden Pedro were located at [facility] Radiation On[facility] Department.  Raenette SARALEE BOLICK was located at home.    The above documentation reflects my direct findings during this shared patient visit. Please see the separate note by Dr. Lisbeth Renshaw on this date for the remainder of the patient's plan of care.    Carola Rhine, [facility]    **Disclaimer: This note was dictated with voice recognition software. Similar sounding words can inadvertently be transcribed and this note may contain transcription errors which may not have been corrected upon publication of note.**

## 2021-11-28 ENCOUNTER — Encounter (HOSPITAL_COMMUNITY): Payer: Self-pay

## 2021-11-28 ENCOUNTER — Encounter: Payer: Self-pay | Admitting: Radiation Oncology

## 2021-11-28 ENCOUNTER — Encounter (HOSPITAL_COMMUNITY)
Admission: RE | Admit: 2021-11-28 | Discharge: 2021-11-28 | Disposition: A | Discharge status: Home / Self Care | Payer: Medicare Other | Source: Ambulatory Visit | Attending: Surgery | Admitting: Surgery

## 2021-11-28 ENCOUNTER — Ambulatory Visit
Admission: RE | Admit: 2021-11-28 | Discharge: 2021-11-28 | Disposition: A | Discharge status: Home / Self Care | Payer: Medicare Other | Source: Ambulatory Visit | Attending: Radiation Oncology | Admitting: Radiation Oncology

## 2021-11-28 ENCOUNTER — Other Ambulatory Visit: Payer: Self-pay

## 2021-11-28 VITALS — Ht 67.5 in | Wt 184.0 lb

## 2021-11-28 VITALS — BP 150/80 | HR 82 | Temp 97.6°F | Resp 18 | Ht 67.0 in

## 2021-11-28 DIAGNOSIS — Z01818 Encounter for other preprocedural examination: Secondary | ICD-10-CM | POA: Insufficient documentation

## 2021-11-28 DIAGNOSIS — K219 Gastro-esophageal reflux disease without esophagitis: Secondary | ICD-10-CM | POA: Insufficient documentation

## 2021-11-28 DIAGNOSIS — C50911 Malignant neoplasm of unspecified site of right female breast: Secondary | ICD-10-CM | POA: Diagnosis not present

## 2021-11-28 DIAGNOSIS — J449 Chronic obstructive pulmonary disease, unspecified: Secondary | ICD-10-CM | POA: Insufficient documentation

## 2021-11-28 DIAGNOSIS — Z8616 Personal history of COVID-19: Secondary | ICD-10-CM | POA: Diagnosis not present

## 2021-11-28 DIAGNOSIS — Z170 Estrogen receptor positive status [ER+]: Secondary | ICD-10-CM

## 2021-11-28 DIAGNOSIS — C50411 Malignant neoplasm of upper-outer quadrant of right female breast: Secondary | ICD-10-CM | POA: Diagnosis not present

## 2021-11-28 DIAGNOSIS — I10 Essential (primary) hypertension: Secondary | ICD-10-CM | POA: Insufficient documentation

## 2021-11-28 DIAGNOSIS — Z85828 Personal history of other malignant neoplasm of skin: Secondary | ICD-10-CM | POA: Diagnosis not present

## 2021-11-28 DIAGNOSIS — Z87891 Personal history of nicotine dependence: Secondary | ICD-10-CM | POA: Insufficient documentation

## 2021-11-28 DIAGNOSIS — K760 Fatty (change of) liver, not elsewhere classified: Secondary | ICD-10-CM | POA: Diagnosis not present

## 2021-11-28 HISTORY — DX: Chronic obstructive pulmonary disease, unspecified: J44.9

## 2021-11-28 HISTORY — DX: Anxiety disorder, unspecified: F41.9

## 2021-11-28 HISTORY — DX: Age-related osteoporosis without current pathological fracture: M81.0

## 2021-11-28 LAB — COMPREHENSIVE METABOLIC PANEL
ALT: 27 U/L (ref 0–44)
AST: 30 U/L (ref 15–41)
Albumin: 3.7 g/dL (ref 3.5–5.0)
Alkaline Phosphatase: 176 U/L — ABNORMAL HIGH (ref 38–126)
Anion gap: 9 (ref 5–15)
BUN: 18 mg/dL (ref 8–23)
CO2: 27 mmol/L (ref 22–32)
Calcium: 9.8 mg/dL (ref 8.9–10.3)
Chloride: 105 mmol/L (ref 98–111)
Creatinine, Ser: 0.87 mg/dL (ref 0.44–1.00)
GFR, Estimated: >60 mL/min (ref >60)
Glucose, Bld: 92 mg/dL (ref 70–99)
Potassium: 3.8 mmol/L (ref 3.5–5.1)
Sodium: 141 mmol/L (ref 135–145)
Total Bilirubin: 0.7 mg/dL (ref 0.3–1.2)
Total Protein: 7.2 g/dL (ref 6.5–8.1)

## 2021-11-28 LAB — CBC
HCT: 46.5 % — ABNORMAL HIGH (ref 36.0–46.0)
Hemoglobin: 15.2 g/dL — ABNORMAL HIGH (ref 12.0–15.0)
MCH: 29.7 pg (ref 26.0–34.0)
MCHC: 32.7 g/dL (ref 30.0–36.0)
MCV: 90.8 fL (ref 80.0–100.0)
Platelets: 242 10*3/uL (ref 150–400)
RBC: 5.12 MIL/uL — ABNORMAL HIGH (ref 3.87–5.11)
RDW: 13 % (ref 11.5–15.5)
WBC: 9.6 10*3/uL (ref 4.0–10.5)
nRBC: 0 % (ref 0.0–0.2)

## 2021-11-28 NOTE — Progress Notes (Addendum)
PCP - Marda Stalker, PA-C  Cardiologist - none   EP-no  Endocrine-no  Pulm- Dr. Dillard Essex  Chest x-ray - na  EKG -11/28/21  Stress Test - no  ECHO - no  Cardiac Cath - no  AICD-no PM-bo LOOP-no  Dialysis-no  Sleep Study -no CPAP - no  LABS-CBC,CMP  ASA-no  ERAS-yes  HA1C-na Fasting Blood Sugar - na Checks Blood Sugar __0___ times a day  Anesthesia- Blood pressure on 11/27/21 at [place] office was 120/100; at the [facility] blood pressure was 193/83, Blood pressure on 11/22/21 was 128/84 at Dr. Vonna Kotyk office. I have requested records from PCP. On arrival Kelly Delgado's blood pressure was taken manuel , it was 150/80. Patient told me that she was on medication for blood pressure, but it was dizzy while taking it, PCP discounted it. Kelly Delgado checks blood pressure at home and keeps a record for PCP. I asked anesthesia PA-C to review .  Pt denies having chest pain, sob, or fever at this time. All instructions explained to the pt, with a verbal understanding of the material. Pt agrees to go over the instructions while at home for a better understanding. Pt also instructed to self quarantine after being tested for COVID-19. The opportunity to ask questions was provided.

## 2021-11-28 NOTE — Addendum Note (Signed)
Encounter addended by: Hayden Pedro, PA-C on: 11/28/2021 10:10 AM  Actions taken: Clinical Note Signed

## 2021-11-28 NOTE — Pre-Procedure Instructions (Addendum)
Surgical Instructions    Your procedure is scheduled on Thursday, February 9th.  Report to [facility] Main Entrance "A" at 08:00 A.M., then check in with the Admitting office.  Call this number if you have problems the morning of surgery:  [phone]   If you have any questions prior to your surgery date call [phone]: Open Monday-Friday 8am-4pm    Remember:  Do not eat after midnight the night before your surgery  You may drink clear liquids until 07:00 AM the morning of your surgery.   Clear liquids allowed are: Water, Non-Citrus Juices (without pulp), Carbonated Beverages, Clear Tea, Black Coffee Only (NO MILK, CREAM OR POWDERED CREAMER of any kind), and Gatorade.    Take these medicines the morning of surgery with A SIP OF WATER  Budeson-Glycopyrrol-Formoterol (BREZTRI AEROSPHERE)   If needed: albuterol (PROVENTIL) nebulizer albuterol (VENTOLIN HFA)- if you need this, bring it with you on the day of surgery  fluticasone (FLONASE)  Xanax  As of today, STOP taking any Aspirin (unless otherwise instructed by your surgeon) Aleve, Naproxen, Ibuprofen, Motrin, Advil, Goody's, BC's, all herbal medications, fish oil, and all vitamins.                Special instructions:   [place]- Preparing For Surgery  Before surgery, you can play an important role. Because skin is not sterile, your skin needs to be as free of germs as possible. You can reduce the number of germs on your skin by washing with CHG (chlorahexidine gluconate) Soap before surgery.  CHG is an antiseptic cleaner which kills germs and bonds with the skin to continue killing germs even after washing.    Oral Hygiene is also important to reduce your risk of infection.  Remember - BRUSH YOUR TEETH THE MORNING OF SURGERY WITH YOUR REGULAR TOOTHPASTE  Please do not use if you have an allergy to CHG or antibacterial soaps. If your skin becomes reddened/irritated stop using the CHG.  Do not shave (including legs and  underarms) for at least 48 hours prior to first CHG shower. It is OK to shave your face.  Please follow these instructions carefully.   Shower the NIGHT BEFORE SURGERY and the MORNING OF SURGERY  If you chose to wash your hair, wash your hair first as usual with your normal shampoo.  After you shampoo, rinse your hair and body thoroughly to remove the shampoo.  Use CHG Soap as you would any other liquid soap. You can apply CHG directly to the skin and wash gently with a scrungie or a clean washcloth.   Apply the CHG Soap to your body ONLY FROM THE NECK DOWN.  Do not use on open wounds or open sores. Avoid contact with your eyes, ears, mouth and genitals (private parts). Wash Face and genitals (private parts)  with your normal soap.   Wash thoroughly, paying special attention to the area where your surgery will be performed.  Thoroughly rinse your body with warm water from the neck down.  DO NOT shower/wash with your normal soap after using and rinsing off the CHG Soap.  Pat yourself dry with a CLEAN TOWEL.  Wear CLEAN PAJAMAS to bed the night before surgery  Place CLEAN SHEETS on your bed the night before your surgery  DO NOT SLEEP WITH PETS.   Day of Surgery: Shower with CHG soap. Do not wear jewelry, make up, nail polish, gel polish, artificial nails, or any other type of covering on natural nails including finger  and toenails. If patients have artificial nails, gel coating, etc. that need to be removed by a nail salon please have this removed prior to surgery. Surgery may need to be canceled/delayed if the surgeon/anesthesiologist feels like the patient is unable to be adequately monitored. Do not wear lotions, powders, perfumes, or deodorant. Do not shave 48 hours prior to surgery.   Do not bring valuables to the hospital. [facility] is not responsible for any belongings or valuables. Wear Clean/Comfortable clothing the morning of surgery Remember to brush your teeth WITH  YOUR REGULAR TOOTHPASTE.     Contacts, glasses, piercing's, hearing aid's, dentures or partials may not be worn into surgery, please bring cases for these belongings.    For patients admitted to the hospital, discharge time will be determined by your treatment team.   Patients discharged the day of surgery will not be allowed to drive home, and someone needs to stay with them for 24 hours.  NO VISITORS WILL BE ALLOWED IN PRE-OP WHERE PATIENTS ARE PREPPED FOR SURGERY.  ONLY 1 SUPPORT PERSON MAY BE PRESENT IN THE WAITING ROOM WHILE YOU ARE IN SURGERY.  IF YOU ARE TO BE ADMITTED, ONCE YOU ARE IN YOUR ROOM YOU WILL BE ALLOWED TWO (2) VISITORS. (1) VISITOR MAY STAY OVERNIGHT BUT MUST ARRIVE TO THE ROOM BY 8pm.  Minor children may have two parents present. Special consideration for safety and communication Please read over the following fact sheets that you were given.   3 days prior to your procedure or After your COVID test   You are not required to quarantine however you are required to wear a well-fitting mask when you are out and around people not in your household. If your mask becomes wet or soiled, replace with a new one.   Wash your hands often with soap and water for 20 seconds or clean your hands with an alcohol-based hand sanitizer that contains at least 60% alcohol.   Do not share personal items.   Notify your provider:  o if you are in close contact with someone who has COVID  o or if you develop a fever of 100.4 or greater, sneezing, cough, sore throat, shortness of breath or body aches.

## 2021-11-28 NOTE — Pre-Procedure Instructions (Signed)
Surgical Instructions    Your procedure is scheduled on Thursday, February 9th.  Report to [facility] Main Entrance "A" at 08:00 A.M., then check in with the Admitting office.          Your surgery or procedure is scheduled to begin at 10:00 AM  Call this number if you have problems the morning of surgery:  [phone]  this is the Pre Operative desk.   If you have any questions prior to your surgery date call [phone]: Open Monday-Friday 8am-4pm    Remember:  Do not eat after midnight the night before your surgery  You may drink clear liquids until 07:00 AM the morning of your surgery.   Clear liquids allowed are: Water, Non-Citrus Juices (without pulp), Carbonated Beverages, Clear Tea, Black Coffee Only (NO MILK, CREAM OR POWDERED CREAMER of any kind), and Gatorade.    Take these medicines the morning of surgery with A SIP OF WATER  Budeson-Glycopyrrol-Formoterol (BREZTRI AEROSPHERE)   If needed: albuterol (PROVENTIL) nebulizer albuterol (VENTOLIN HFA)- if you need this, bring it with you on the day of surgery  fluticasone (FLONASE)   As of today, STOP taking any Aspirin (unless otherwise instructed by your surgeon) Aleve, Naproxen, Ibuprofen, Motrin, Advil, Goody's, BC's, all herbal medications, fish oil, and all vitamins.                      Contacts, glasses, piercing's, hearing aid's, dentures or partials may not be worn into surgery, please bring cases for these belongings.    For patients admitted to the hospital, discharge time will be determined by your treatment team.   Patients discharged the day of surgery will not be allowed to drive home, and someone needs to stay with them for 24 hours.  NO VISITORS WILL BE ALLOWED IN PRE-OP WHERE PATIENTS ARE PREPPED FOR SURGERY.  ONLY 1 SUPPORT PERSON MAY BE PRESENT IN THE WAITING ROOM WHILE YOU ARE IN SURGERY.  IF YOU ARE TO BE ADMITTED, ONCE YOU ARE IN YOUR ROOM YOU WILL BE ALLOWED TWO (2) VISITORS. (1) VISITOR MAY STAY  OVERNIGHT BUT MUST ARRIVE TO THE ROOM BY 8pm.  Minor children may have two parents present. Special consideration for safety and communication needs will be reviewed on a case by case basis.   Special instructions:   [place]- Preparing For Surgery  Before surgery, you can play an important role. Because skin is not sterile, your skin needs to be as free of germs as possible. You can reduce the number of germs on your skin by washing with CHG (chlorahexidine gluconate) Soap before surgery.  CHG is an antiseptic cleaner which kills germs and bonds with the skin to continue killing germs even after washing.    Oral Hygiene is also important to reduce your risk of infection.  Remember - BRUSH YOUR TEETH THE MORNING OF SURGERY WITH YOUR REGULAR TOOTHPASTE  Please do not use if you have an allergy to CHG or antibacterial soaps. If your skin becomes reddened/irritated stop using the CHG.  Do not shave (including legs and underarms) for at least 48 hours prior to first CHG shower. It is OK to shave your face.  Please follow these instructions carefully.   Shower the NIGHT BEFORE SURGERY and the MORNING OF SURGERY  If you chose to wash your hair, wash your hair first as usual with your normal shampoo.  After you shampoo, rinse your hair and body thoroughly to remove the shampoo.  Use  CHG Soap as you would any other liquid soap. You can apply CHG directly to the skin and wash gently with a scrungie or a clean washcloth.   Apply the CHG Soap to your body ONLY FROM THE NECK DOWN.  Do not use on open wounds or open sores. Avoid contact with your eyes, ears, mouth and genitals (private parts). Wash Face and genitals (private parts)  with your normal soap.   Wash thoroughly, paying special attention to the area where your surgery will be performed.  Thoroughly rinse your body with warm water from the neck down.  DO NOT shower/wash with your normal soap after using and rinsing off the CHG  Soap.  Pat yourself dry with a CLEAN TOWEL.  Wear CLEAN PAJAMAS to bed the night before surgery  Place CLEAN SHEETS on your bed the night before your surgery  DO NOT SLEEP WITH PETS.  Day of Surgery: Shower with CHG soap. Do not wear jewelry, make up, nail polish, gel polish, artificial nails, or any other type of covering on natural nails including finger and toenails. If patients have artificial nails, gel coating, etc. that need to be removed by a nail salon please have this removed prior to surgery. Surgery may need to be canceled/delayed if the surgeon/anesthesiologist feels like the patient is unable to be adequately monitored. Do not wear lotions, powders, perfumes, or deodorant. Do not shave 48 hours prior to surgery.   Do not bring valuables to the hospital. [facility] is not responsible for any belongings or valuables. Wear Clean/Comfortable clothing the morning of surgery Remember to brush your teeth WITH YOUR REGULAR TOOTHPASTE.   Contacts, glasses, piercing's, hearing aid's, dentures or partials may not be worn into surgery, please bring cases for these belongings.    Patients discharged the day of surgery will not be allowed to drive home, and someone needs to stay with them for 24 hours.  NO VISITORS WILL BE ALLOWED IN PRE-OP WHERE PATIENTS ARE PREPPED FOR SURGERY.  ONLY 1 SUPPORT PERSON MAY BE PRESENT IN THE WAITING ROOM WHILE YOU ARE IN SURGERY.  IF YOU ARE TO BE ADMITTED, ONCE YOU ARE IN YOUR ROOM YOU WILL BE ALLOWED TWO (2) VISITORS. (1) VISITOR MAY STAY OVERNIGHT BUT MUST ARRIVE TO THE ROOM BY 8pm.  Minor children may have two parents present. Special consideration for safety and communication needs will be reviewed on a case by case basis.  Please read over the fact sheets that you were given.

## 2021-11-29 ENCOUNTER — Ambulatory Visit
Admission: RE | Admit: 2021-11-29 | Discharge: 2021-11-29 | Disposition: A | Discharge status: Home / Self Care | Payer: Medicare Other | Source: Ambulatory Visit | Attending: Family Medicine | Admitting: Family Medicine

## 2021-11-29 DIAGNOSIS — R921 Mammographic calcification found on diagnostic imaging of breast: Secondary | ICD-10-CM | POA: Diagnosis not present

## 2021-11-29 DIAGNOSIS — N6011 Diffuse cystic mastopathy of right breast: Secondary | ICD-10-CM | POA: Diagnosis not present

## 2021-11-29 NOTE — Anesthesia Preprocedure Evaluation (Addendum)
Anesthesia Evaluation  Patient identified by MRN, date of birth, ID band Patient awake    Reviewed: Allergy & Precautions, NPO status , Patient's Chart, lab work & pertinent test results  Airway Mallampati: II  TM Distance: >3 FB Neck ROM: Full    Dental  (+) Teeth Intact, Dental Advisory Given   Pulmonary shortness of breath and with exertion, asthma , COPD,  COPD inhaler, former smoker,  Quit smoking 1970, previous light smoker   She had pulmonary follow-up with Eric Form, NP on 11/27/2021.  She had a resolved COPD flare following completion of doxycycline and prednisone taper.  Continue Breztri twice daily and nebulizers as needed.   Last needed rescue inhaler yesterday, uses almost daily Used neb this AM  Snores at night, sometimes wakes up coughing and SOB, has never been tested for sleep apnea    + decreased breath sounds      Cardiovascular hypertension (only takes HCTZ occasionally for swelling- was previously on a daily dose but was having dizziness- 173/88 in preop, per pt normally 130-140 SBP ), Pt. on medications + CAD  Normal cardiovascular exam Rhythm:Regular Rate:Normal     Neuro/Psych PSYCHIATRIC DISORDERS Anxiety negative neurological ROS     GI/Hepatic Neg liver ROS, hiatal hernia, GERD  Controlled,  Endo/Other  negative endocrine ROS  Renal/GU negative Renal ROS  negative genitourinary   Musculoskeletal negative musculoskeletal ROS (+)   Abdominal   Peds  Hematology negative hematology ROS (+) hct 46.5   Anesthesia Other Findings Breast ca   Reproductive/Obstetrics negative OB ROS                          Anesthesia Physical Anesthesia Plan  ASA: 3  Anesthesia Plan: General   Post-op Pain Management: Tylenol PO (pre-op)   Induction: Intravenous  PONV Risk Score and Plan: 3 and Dexamethasone, Ondansetron and Treatment may vary due to age or medical  condition  Airway Management Planned: LMA  Additional Equipment: None  Intra-op Plan:   Post-operative Plan: Extubation in OR  Informed Consent: I have reviewed the patients History and Physical, chart, labs and discussed the procedure including the risks, benefits and alternatives for the proposed anesthesia with the patient or authorized representative who has indicated his/her understanding and acceptance.     Dental advisory given  Plan Discussed with: CRNA  Anesthesia Plan Comments: (Poorly controlled HTN- pt states the automatic BP cuff is the issue Poorly controlled lung disease)     Anesthesia Quick Evaluation

## 2021-11-29 NOTE — Progress Notes (Signed)
Anesthesia Chart Review:  Case: [id number] Date/Time: 12/05/21 0945   Procedure: RIGHT BREAST LUMPECTOMY WITH RADIOACTIVE SEED LOCALIZATION (Right: Breast)   Anesthesia type: General   Pre-op diagnosis: RIGHT BREAST INVASIVE DUCTAL CARCINOMA   Location: [place] OR ROOM 08 / [place] [facility]   Surgeons: Donnie Mesa, MD       DISCUSSION: Patient is an 86 year old female scheduled for the above procedure.  History includes former smoker (quit 2/1/7), HTN, COPD, asthmatic bronchitis (dyspnea with flares), GERD, skin cancer (BCC), breast cancer (11/05/21 right breat biopsy: invasive ductal carcinoma, DCIS), hysterectomy (06/28/79). + COVID-19 07/26/21.  She had pulmonary follow-up with Eric Form, NP on 11/27/2021.  She had a resolved COPD flare following completion of doxycycline and prednisone taper.  Continue Breztri twice daily and nebulizers as needed.  She wished patient well with her scheduled breast surgery.  PAT RN note by Burgess Amor, RN reviewed. Patient with variable BP readings. She has HCTZ 25 mg daily as needed for swelling. She had been on a daily BP medication in the past, but reportedly discontinued by her PCP due to dizziness. Patient does keep a home BP log that she reviews with her PCP. Manual BP at PAT was 150/80. EKG stable with LAFB.  RSL is scheduled for 12/04/2021 at 2 PM.  Anesthesia team to evaluate on the day of surgery.   VS: BP (!) 150/80 Comment: manual   Pulse 82    Temp 36.4 C (Oral)    Resp 18    Ht $R'5\' 7"'km$  (1.702 m)    SpO2 93%    BMI 28.82 kg/m    PROVIDERS: Marda Stalker, PA-C is PCP  Benay Pike, MD is HEM-ONC Kyung Rudd, MD is RAD-ONC Brand Males, MD is pulmonologist   LABS: Labs reviewed: Acceptable for surgery. CMET done with labs due to prior history of elevated LFTs--Alk Ph still elevated, but otherwise LFTS normal with labs.  (all labs ordered are listed, but only abnormal results are displayed)  Labs Reviewed  COMPREHENSIVE METABOLIC PANEL -  Abnormal; Notable for the following components:      Result Value   Alkaline Phosphatase 176 (*)    All other components within normal limits  CBC - Abnormal; Notable for the following components:   RBC 5.12 (*)    Hemoglobin 15.2 (*)    HCT 46.5 (*)    All other components within normal limits    PFTs 09/12/21: FVC 1.92 (64%), post 2.28 (76%). FEV1 1.31 (58%), post 1.71 (76%). DLCO unc 17.02 (80%), cor 16.92 (79%).   IMAGES: CXR 11/11/21: FINDINGS: The heart size and mediastinal contours are within normal limits. Both lungs are clear. The visualized skeletal structures are stable.  IMPRESSION: No active cardiopulmonary disease.   CT Chest High Resolution 06/27/21: IMPRESSION: 1. The appearance of the lungs may suggest very early or mild interstitial lung disease, with a spectrum of findings considered indeterminate for usual interstitial pneumonia (UIP) per current ATS guidelines, as detailed above. If there is persistent clinical concern for interstitial lung disease, repeat high-resolution chest CT would be recommended in 12 months to assess for temporal changes in the appearance of the lung parenchyma. 2. Aortic atherosclerosis.    EKG: 11/28/21: Normal sinus rhythm Left anterior fasicular block Moderate voltage criteria for LVH, may be normal variant ( R in aVL , Cornell product ) Abnormal ECG When compared with ECG of 17-Jul-2017 22:09, No significant change since last tracing Confirmed by Candee Furbish [phone]) on 11/28/2021 8:22:05 PM   CV:  N/A  Past Medical History:  Diagnosis Date   Anxiety    Situatual   Asthmatic bronchitis    Basal cell carcinoma of breast 1990's?   "left side, burned it off" (06/07/2013)   Breast cancer ([place]) 11/05/2021   COPD (chronic obstructive pulmonary disease) ([place])    GERD (gastroesophageal reflux disease)    H/O hiatal hernia    Hypertension    Osteoporosis    Schatzki's ring    Shortness of breath    "related to asthmatic  bronchitis only" (06/07/2013)    Past Surgical History:  Procedure Laterality Date   ABDOMINAL HYSTERECTOMY  06/28/1979   BREAST BIOPSY Right 11/05/2021   CATARACT EXTRACTION W/ INTRAOCULAR LENS  IMPLANT, BILATERAL Bilateral 06/27/1989   DILATION AND CURETTAGE OF UTERUS  06/28/1979   "1" (06/07/2013)   ESOPHAGEAL DILATION  10/27/2010   "just once" (06/07/2013)   HIP PINNING,CANNULATED Right 10/09/2014   Procedure: CANNULATED HIP PINNING;  Surgeon: Renette Butters, MD;  Location: [facility];  Service: Orthopedics;  Laterality: Right;   URETHRAL DIVERTICULUM REPAIR  06/27/1969    MEDICATIONS:  albuterol (PROVENTIL) (2.5 MG/3ML) 0.083% nebulizer solution   albuterol (VENTOLIN HFA) 108 (90 Base) MCG/ACT inhaler   ALPRAZolam (XANAX) 0.25 MG tablet   Ascorbic Acid (VITAMIN C PO)   Budeson-Glycopyrrol-Formoterol (BREZTRI AEROSPHERE) 160-9-4.8 MCG/ACT AERO   Calcium Carb-Cholecalciferol (CALCIUM 600 + D PO)   Cholecalciferol (VITAMIN D-3 PO)   fluticasone (FLONASE) 50 MCG/ACT nasal spray   hydrochlorothiazide (HYDRODIURIL) 25 MG tablet   Multiple Vitamin (MULTIVITAMIN WITH MINERALS) TABS tablet   No current facility-administered medications for this encounter.    Myra Gianotti, PA-C Surgical Short Stay/Anesthesiology [facility] Phone [phone] [facility] Phone [phone] 11/29/2021 4:28 PM

## 2021-12-03 ENCOUNTER — Other Ambulatory Visit: Payer: Self-pay | Admitting: Internal Medicine

## 2021-12-04 ENCOUNTER — Ambulatory Visit
Admission: RE | Admit: 2021-12-04 | Discharge: 2021-12-04 | Disposition: A | Discharge status: Home / Self Care | Payer: Medicare Other | Source: Ambulatory Visit | Attending: Surgery | Admitting: Surgery

## 2021-12-04 DIAGNOSIS — C50911 Malignant neoplasm of unspecified site of right female breast: Secondary | ICD-10-CM

## 2021-12-05 ENCOUNTER — Encounter (HOSPITAL_COMMUNITY): Payer: Self-pay | Admitting: Surgery

## 2021-12-05 ENCOUNTER — Ambulatory Visit (HOSPITAL_BASED_OUTPATIENT_CLINIC_OR_DEPARTMENT_OTHER): Payer: Medicare Other | Admitting: Anesthesiology

## 2021-12-05 ENCOUNTER — Ambulatory Visit
Admission: RE | Admit: 2021-12-05 | Discharge: 2021-12-05 | Disposition: A | Discharge status: Home / Self Care | Payer: Medicare Other | Source: Ambulatory Visit | Attending: Surgery | Admitting: Surgery

## 2021-12-05 ENCOUNTER — Encounter (HOSPITAL_COMMUNITY)
Admission: RE | Disposition: A | Discharge status: Home / Self Care | Payer: Self-pay | Source: Home / Self Care | Attending: Surgery

## 2021-12-05 ENCOUNTER — Other Ambulatory Visit: Payer: Self-pay

## 2021-12-05 ENCOUNTER — Ambulatory Visit (HOSPITAL_COMMUNITY): Payer: Medicare Other | Admitting: Vascular Surgery

## 2021-12-05 ENCOUNTER — Ambulatory Visit (HOSPITAL_COMMUNITY)
Admission: RE | Admit: 2021-12-05 | Discharge: 2021-12-05 | Disposition: A | Discharge status: Home / Self Care | Payer: Medicare Other | Attending: Surgery | Admitting: Surgery

## 2021-12-05 DIAGNOSIS — Z87891 Personal history of nicotine dependence: Secondary | ICD-10-CM | POA: Insufficient documentation

## 2021-12-05 DIAGNOSIS — F419 Anxiety disorder, unspecified: Secondary | ICD-10-CM | POA: Diagnosis not present

## 2021-12-05 DIAGNOSIS — J449 Chronic obstructive pulmonary disease, unspecified: Secondary | ICD-10-CM | POA: Insufficient documentation

## 2021-12-05 DIAGNOSIS — Z170 Estrogen receptor positive status [ER+]: Secondary | ICD-10-CM | POA: Diagnosis not present

## 2021-12-05 DIAGNOSIS — D0511 Intraductal carcinoma in situ of right breast: Secondary | ICD-10-CM | POA: Diagnosis not present

## 2021-12-05 DIAGNOSIS — K219 Gastro-esophageal reflux disease without esophagitis: Secondary | ICD-10-CM | POA: Insufficient documentation

## 2021-12-05 DIAGNOSIS — C50911 Malignant neoplasm of unspecified site of right female breast: Secondary | ICD-10-CM | POA: Diagnosis not present

## 2021-12-05 DIAGNOSIS — Z803 Family history of malignant neoplasm of breast: Secondary | ICD-10-CM | POA: Insufficient documentation

## 2021-12-05 DIAGNOSIS — E785 Hyperlipidemia, unspecified: Secondary | ICD-10-CM | POA: Diagnosis not present

## 2021-12-05 DIAGNOSIS — Z79899 Other long term (current) drug therapy: Secondary | ICD-10-CM | POA: Diagnosis not present

## 2021-12-05 DIAGNOSIS — I10 Essential (primary) hypertension: Secondary | ICD-10-CM | POA: Insufficient documentation

## 2021-12-05 DIAGNOSIS — I2510 Atherosclerotic heart disease of native coronary artery without angina pectoris: Secondary | ICD-10-CM | POA: Diagnosis not present

## 2021-12-05 DIAGNOSIS — C50411 Malignant neoplasm of upper-outer quadrant of right female breast: Secondary | ICD-10-CM | POA: Diagnosis not present

## 2021-12-05 HISTORY — PX: BREAST LUMPECTOMY WITH RADIOACTIVE SEED LOCALIZATION: SHX6424

## 2021-12-05 SURGERY — BREAST LUMPECTOMY WITH RADIOACTIVE SEED LOCALIZATION
Anesthesia: General | Site: Breast | Laterality: Right

## 2021-12-05 MED ORDER — OXYCODONE HCL 5 MG PO TABS: 5.0000 mg | ORAL_TABLET | Freq: Once | ORAL | Status: DC | PRN

## 2021-12-05 MED ORDER — PHENYLEPHRINE HCL-NACL 20-0.9 MG/250ML-% IV SOLN: INTRAVENOUS | Status: DC | PRN

## 2021-12-05 MED ORDER — LACTATED RINGERS IV SOLN: INTRAVENOUS | Status: DC

## 2021-12-05 MED ORDER — ALBUTEROL SULFATE HFA 108 (90 BASE) MCG/ACT IN AERS
INHALATION_SPRAY | RESPIRATORY_TRACT | Status: DC | PRN
  Administered 2021-12-05: 8 via RESPIRATORY_TRACT
  Administered 2021-12-05: 6 via RESPIRATORY_TRACT

## 2021-12-05 MED ORDER — ONDANSETRON HCL 4 MG/2ML IJ SOLN: 4.0000 mg | Freq: Once | INTRAMUSCULAR | Status: DC | PRN

## 2021-12-05 MED ORDER — FENTANYL CITRATE (PF) 100 MCG/2ML IJ SOLN: 25.0000 ug | INTRAMUSCULAR | Status: DC | PRN

## 2021-12-05 MED ORDER — FENTANYL CITRATE (PF) 250 MCG/5ML IJ SOLN
INTRAMUSCULAR | Status: DC | PRN
  Administered 2021-12-05 (×2): 50 ug via INTRAVENOUS

## 2021-12-05 MED ORDER — CHLORHEXIDINE GLUCONATE 0.12 % MT SOLN
OROMUCOSAL | Status: AC
  Administered 2021-12-05: 15 mL via OROMUCOSAL
  Filled 2021-12-05: qty 15

## 2021-12-05 MED ORDER — ONDANSETRON HCL 4 MG/2ML IJ SOLN
INTRAMUSCULAR | Status: DC | PRN
  Administered 2021-12-05: 4 mg via INTRAVENOUS

## 2021-12-05 MED ORDER — PHENYLEPHRINE HCL-NACL 20-0.9 MG/250ML-% IV SOLN
INTRAVENOUS | Status: DC | PRN
  Administered 2021-12-05: 30 ug/min via INTRAVENOUS

## 2021-12-05 MED ORDER — LIDOCAINE 2% (20 MG/ML) 5 ML SYRINGE
INTRAMUSCULAR | Status: DC | PRN
  Administered 2021-12-05: 60 mg via INTRAVENOUS

## 2021-12-05 MED ORDER — PROPOFOL 10 MG/ML IV BOLUS
INTRAVENOUS | Status: DC | PRN
Start: 2021-12-05 — End: 2021-12-05
  Administered 2021-12-05 (×2): 30 mg via INTRAVENOUS
  Administered 2021-12-05: 100 mg via INTRAVENOUS
  Administered 2021-12-05: 30 mg via INTRAVENOUS

## 2021-12-05 MED ORDER — CHLORHEXIDINE GLUCONATE 0.12 % MT SOLN: 15.0000 mL | Freq: Once | OROMUCOSAL | Status: AC

## 2021-12-05 MED ORDER — ACETAMINOPHEN 500 MG PO TABS: 1000.0000 mg | ORAL_TABLET | ORAL | Status: DC

## 2021-12-05 MED ORDER — CHLORHEXIDINE GLUCONATE CLOTH 2 % EX PADS: 6.0000 | MEDICATED_PAD | Freq: Once | CUTANEOUS | Status: DC

## 2021-12-05 MED ORDER — DEXAMETHASONE SODIUM PHOSPHATE 10 MG/ML IJ SOLN
INTRAMUSCULAR | Status: DC | PRN
  Administered 2021-12-05: 10 mg via INTRAVENOUS

## 2021-12-05 MED ORDER — PROPOFOL 10 MG/ML IV BOLUS
INTRAVENOUS | Status: AC
  Filled 2021-12-05: qty 20

## 2021-12-05 MED ORDER — BUPIVACAINE-EPINEPHRINE 0.25% -1:200000 IJ SOLN
INTRAMUSCULAR | Status: DC | PRN
  Administered 2021-12-05: 10 mL

## 2021-12-05 MED ORDER — 0.9 % SODIUM CHLORIDE (POUR BTL) OPTIME
TOPICAL | Status: DC | PRN
  Administered 2021-12-05: 1000 mL

## 2021-12-05 MED ORDER — OXYCODONE HCL 5 MG/5ML PO SOLN: 5.0000 mg | Freq: Once | ORAL | Status: DC | PRN

## 2021-12-05 MED ORDER — PHENYLEPHRINE 40 MCG/ML (10ML) SYRINGE FOR IV PUSH (FOR BLOOD PRESSURE SUPPORT)
PREFILLED_SYRINGE | INTRAVENOUS | Status: DC | PRN
Start: 2021-12-05 — End: 2021-12-05
  Administered 2021-12-05: 40 ug via INTRAVENOUS
  Administered 2021-12-05 (×2): 80 ug via INTRAVENOUS

## 2021-12-05 MED ORDER — FENTANYL CITRATE (PF) 250 MCG/5ML IJ SOLN
INTRAMUSCULAR | Status: AC
  Filled 2021-12-05: qty 5

## 2021-12-05 MED ORDER — CEFAZOLIN SODIUM-DEXTROSE 2-4 GM/100ML-% IV SOLN
INTRAVENOUS | Status: AC
  Filled 2021-12-05: qty 100

## 2021-12-05 MED ORDER — CEFAZOLIN SODIUM-DEXTROSE 2-4 GM/100ML-% IV SOLN
2.0000 g | INTRAVENOUS | Status: AC
  Administered 2021-12-05: 2 g via INTRAVENOUS

## 2021-12-05 MED ORDER — ACETAMINOPHEN 500 MG PO TABS
ORAL_TABLET | ORAL | Status: AC
  Administered 2021-12-05: 1000 mg via ORAL
  Filled 2021-12-05: qty 2

## 2021-12-05 MED ORDER — ACETAMINOPHEN 500 MG PO TABS: 1000.0000 mg | ORAL_TABLET | Freq: Once | ORAL | Status: AC

## 2021-12-05 MED ORDER — BUPIVACAINE-EPINEPHRINE (PF) 0.25% -1:200000 IJ SOLN
INTRAMUSCULAR | Status: AC
  Filled 2021-12-05: qty 30

## 2021-12-05 MED ORDER — ORAL CARE MOUTH RINSE: 15.0000 mL | Freq: Once | OROMUCOSAL | Status: AC

## 2021-12-05 SURGICAL SUPPLY — 44 items
APL PRP STRL LF DISP 70% ISPRP (MISCELLANEOUS) ×1
APL SKNCLS STERI-STRIP NONHPOA (GAUZE/BANDAGES/DRESSINGS) ×1
APPLIER CLIP 9.375 MED OPEN (MISCELLANEOUS)
APR CLP MED 9.3 20 MLT OPN (MISCELLANEOUS)
BAG COUNTER SPONGE SURGICOUNT (BAG) ×2 IMPLANT
BAG SPNG CNTER NS LX DISP (BAG) ×1
BENZOIN TINCTURE PRP APPL 2/3 (GAUZE/BANDAGES/DRESSINGS) ×2 IMPLANT
BINDER BREAST LRG (GAUZE/BANDAGES/DRESSINGS) IMPLANT
BINDER BREAST XLRG (GAUZE/BANDAGES/DRESSINGS) IMPLANT
CANISTER SUCT 3000ML PPV (MISCELLANEOUS) ×2 IMPLANT
CHLORAPREP W/TINT 26 (MISCELLANEOUS) ×2 IMPLANT
CLIP APPLIE 9.375 MED OPEN (MISCELLANEOUS) IMPLANT
CLSR STERI-STRIP ANTIMIC 1/2X4 (GAUZE/BANDAGES/DRESSINGS) ×1 IMPLANT
COVER PROBE W GEL 5X96 (DRAPES) ×2 IMPLANT
COVER SURGICAL LIGHT HANDLE (MISCELLANEOUS) ×2 IMPLANT
DEVICE DUBIN SPECIMEN MAMMOGRA (MISCELLANEOUS) ×2 IMPLANT
DRAPE CHEST BREAST 15X10 FENES (DRAPES) ×2 IMPLANT
DRSG TEGADERM 4X4.75 (GAUZE/BANDAGES/DRESSINGS) ×2 IMPLANT
ELECT CAUTERY BLADE 6.4 (BLADE) ×2 IMPLANT
ELECT REM PT RETURN 9FT ADLT (ELECTROSURGICAL) ×2
ELECTRODE REM PT RTRN 9FT ADLT (ELECTROSURGICAL) ×1 IMPLANT
GAUZE SPONGE 2X2 8PLY STRL LF (GAUZE/BANDAGES/DRESSINGS) ×1 IMPLANT
GLOVE SURG ENC MOIS LTX SZ7 (GLOVE) ×2 IMPLANT
GLOVE SURG UNDER POLY LF SZ7.5 (GLOVE) ×2 IMPLANT
GOWN STRL REUS W/ TWL LRG LVL3 (GOWN DISPOSABLE) ×2 IMPLANT
GOWN STRL REUS W/TWL LRG LVL3 (GOWN DISPOSABLE) ×4
ILLUMINATOR WAVEGUIDE N/F (MISCELLANEOUS) IMPLANT
KIT BASIN OR (CUSTOM PROCEDURE TRAY) ×2 IMPLANT
KIT MARKER MARGIN INK (KITS) ×2 IMPLANT
LIGHT WAVEGUIDE WIDE FLAT (MISCELLANEOUS) IMPLANT
NDL HYPO 25GX1X1/2 BEV (NEEDLE) ×1 IMPLANT
NEEDLE HYPO 25GX1X1/2 BEV (NEEDLE) ×2 IMPLANT
NS IRRIG 1000ML POUR BTL (IV SOLUTION) ×2 IMPLANT
PACK GENERAL/GYN (CUSTOM PROCEDURE TRAY) ×2 IMPLANT
SPONGE GAUZE 2X2 STER 10/PKG (GAUZE/BANDAGES/DRESSINGS) ×1
SPONGE T-LAP 4X18 ~~LOC~~+RFID (SPONGE) ×2 IMPLANT
STRIP CLOSURE SKIN 1/2X4 (GAUZE/BANDAGES/DRESSINGS) ×2 IMPLANT
SUT MNCRL AB 4-0 PS2 18 (SUTURE) ×2 IMPLANT
SUT SILK 2 0 SH (SUTURE) IMPLANT
SUT VIC AB 3-0 SH 27 (SUTURE) ×2
SUT VIC AB 3-0 SH 27X BRD (SUTURE) ×1 IMPLANT
SYR CONTROL 10ML LL (SYRINGE) ×2 IMPLANT
TOWEL GREEN STERILE (TOWEL DISPOSABLE) ×2 IMPLANT
TOWEL GREEN STERILE FF (TOWEL DISPOSABLE) ×2 IMPLANT

## 2021-12-05 NOTE — Discharge Instructions (Signed)
[facility] Office Phone Number [phone]  BREAST BIOPSY/ PARTIAL MASTECTOMY: POST OP INSTRUCTIONS  Always review your discharge instruction sheet given to you by the facility where your surgery was performed.  IF YOU HAVE DISABILITY OR FAMILY LEAVE FORMS, YOU MUST BRING THEM TO THE OFFICE FOR PROCESSING.  DO NOT GIVE THEM TO YOUR DOCTOR.  A prescription for pain medication may be given to you upon discharge.  Take your pain medication as prescribed, if needed.  If narcotic pain medicine is not needed, then you may take acetaminophen (Tylenol) or ibuprofen (Advil) as needed. Take your usually prescribed medications unless otherwise directed If you need a refill on your pain medication, please contact your pharmacy.  They will contact our office to request authorization.  Prescriptions will not be filled after 5pm or on week-ends. You should eat very light the first 24 hours after surgery, such as soup, crackers, pudding, etc.  Resume your normal diet the day after surgery. Most patients will experience some swelling and bruising in the breast.  Ice packs and a good support bra will help.  Swelling and bruising can take several days to resolve.  It is common to experience some constipation if taking pain medication after surgery.  Increasing fluid intake and taking a stool softener will usually help or prevent this problem from occurring.  A mild laxative (Milk of Magnesia or Miralax) should be taken according to package directions if there are no bowel movements after 48 hours. Unless discharge instructions indicate otherwise, you may remove your bandages 24-48 hours after surgery, and you may shower at that time.  You may have steri-strips (small skin tapes) in place directly over the incision.  These strips should be left on the skin for 7-10 days.  If your surgeon used skin glue on the incision, you may shower in 24 hours.  The glue will flake off over the next 2-3 weeks.  Any  sutures or staples will be removed at the office during your follow-up visit. ACTIVITIES:  You may resume regular daily activities (gradually increasing) beginning the next day.  Wearing a good support bra or sports bra minimizes pain and swelling.  You may have sexual intercourse when it is comfortable. You may drive when you no longer are taking prescription pain medication, you can comfortably wear a seatbelt, and you can safely maneuver your car and apply brakes. RETURN TO WORK:  ______________________________________________________________________________________ You should see your doctor in the office for a follow-up appointment approximately two weeks after your surgery.  Your doctor's nurse will typically make your follow-up appointment when she calls you with your pathology report.  Expect your pathology report 2-3 business days after your surgery.  You may call to check if you do not hear from us after three days. OTHER INSTRUCTIONS: _______________________________________________________________________________________________ _____________________________________________________________________________________________________________________________________ _____________________________________________________________________________________________________________________________________ _____________________________________________________________________________________________________________________________________  WHEN TO CALL YOUR DOCTOR: Fever over 101.0 Nausea and/or vomiting. Extreme swelling or bruising. Continued bleeding from incision. Increased pain, redness, or drainage from the incision.  The clinic staff is available to answer your questions during regular business hours.  Please don't hesitate to call and ask to speak to one of the nurses for clinical concerns.  If you have a medical emergency, go to the nearest emergency room or call 911.  A surgeon from [facility] is always on call at the hospital.  For further questions, please visit [email or web address]   

## 2021-12-05 NOTE — Interval H&P Note (Signed)
History and Physical Interval Note:  12/05/2021 8:49 AM  Kelly Delgado  has presented today for surgery, with the diagnosis of RIGHT BREAST INVASIVE DUCTAL CARCINOMA.  The various methods of treatment have been discussed with the patient and family. After consideration of risks, benefits and other options for treatment, the patient has consented to  Procedure(s): RIGHT BREAST LUMPECTOMY WITH RADIOACTIVE SEED LOCALIZATION (Right) as a surgical intervention.  The patient's history has been reviewed, patient examined, no change in status, stable for surgery.  I have reviewed the patient's chart and labs.  Questions were answered to the patient's satisfaction.     Maia Petties

## 2021-12-05 NOTE — Anesthesia Postprocedure Evaluation (Signed)
Anesthesia Post Note  Patient: Kelly Delgado  Procedure(s) Performed: RIGHT BREAST LUMPECTOMY WITH RADIOACTIVE SEED LOCALIZATION (Right: Breast)     Patient location during evaluation: PACU Anesthesia Type: General Level of consciousness: awake and alert, oriented and patient cooperative Pain management: pain level controlled Vital Signs Assessment: post-procedure vital signs reviewed and stable Respiratory status: spontaneous breathing, nonlabored ventilation and respiratory function stable Cardiovascular status: blood pressure returned to baseline and stable Postop Assessment: no apparent nausea or vomiting Anesthetic complications: no   No notable events documented.  Last Vitals:  Vitals:   12/05/21 1055 12/05/21 1110  BP: (!) 162/69 (!) 163/72  Pulse: 69 66  Resp: 13 14  Temp:  (!) 36.1 C  SpO2: 99% 98%    Last Pain:  Vitals:   12/05/21 1040  TempSrc:   PainSc: 0-No pain                 Pervis Hocking

## 2021-12-05 NOTE — Op Note (Signed)
Pre-op Diagnosis:  Right breast invasive ductal carcinoma Post-op Diagnosis: same Procedure:  Right radioactive seed localized lumpectomy Surgeon:  Latia Mataya K. Anesthesia:  GEN - LMA Indications: This is a pleasant 86 year old female with COPD who presents after recent screening mammogram revealed a right breast mass.  Further work-up revealed a 0.5 x 0.5 x 1.1 cm mass at 12:00 5 cm from the nipple.  Nearby in the right upper outer quadrant she has a 0.7 cm area of calcifications.  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  The patient developed a hematoma after the initial biopsy.  The additional area of calcifications was biopsied and is benign.   Description of procedure: The patient is brought to the operating room placed in supine position on the operating room table. After an adequate level of general anesthesia was obtained, her right breast was prepped with ChloraPrep and draped in sterile fashion. A timeout was taken to ensure the proper patient and proper procedure. We interrogated the breast with the neoprobe. We made a transverse incision in the upper breast after infiltrating with 0.25% Marcaine. Dissection was carried down in the breast tissue with cautery. We used the neoprobe to guide [place] towards the radioactive seed. We excised an area of tissue around the radioactive seed 2 cm in diameter. The specimen was removed and was oriented with a paint kit. Specimen mammogram showed the radioactive seed as well as the biopsy clip within the specimen. This was sent for pathologic examination. There is no residual radioactivity within the biopsy cavity. We inspected carefully for hemostasis. The wound was thoroughly irrigated. The wound was closed with a deep layer of 3-0 Vicryl and a subcuticular layer of 4-0 Monocryl. Benzoin Steri-Strips were applied. The patient was then extubated and brought to the recovery room in  stable condition. All sponge, instrument, and needle counts are correct.  Imogene Burn. Georgette Dover, MD, [facility]  General/ Trauma Surgery  12/05/2021 10:34 AM

## 2021-12-05 NOTE — Transfer of Care (Signed)
Immediate Anesthesia Transfer of Care Note  Patient: Kelly Delgado  Procedure(s) Performed: RIGHT BREAST LUMPECTOMY WITH RADIOACTIVE SEED LOCALIZATION (Right: Breast)  Patient Location: PACU  Anesthesia Type:General  Level of Consciousness: awake, alert  and oriented  Airway & Oxygen Therapy: Patient Spontanous Breathing and Patient connected to face mask oxygen  Post-op Assessment: Report given to RN and Post -op Vital signs reviewed and stable  Post vital signs: Reviewed and stable  Last Vitals:  Vitals Value Taken Time  BP 165/72 12/05/21 1040  Temp    Pulse 81 12/05/21 1040  Resp 16 12/05/21 1040  SpO2 99 % 12/05/21 1040  Vitals shown include unvalidated device data.  Last Pain:  Vitals:   12/05/21 0820  TempSrc:   PainSc: 0-No pain      Patients Stated Pain Goal: 0 (53/29/92 4268)  Complications: No notable events documented.

## 2021-12-05 NOTE — Anesthesia Procedure Notes (Signed)
Procedure Name: Intubation Date/Time: 12/05/2021 9:43 AM Performed by: Griffin Dakin, CRNA Pre-anesthesia Checklist: Patient identified, Emergency Drugs available, Suction available and Patient being monitored Patient Re-evaluated:Patient Re-evaluated prior to induction Oxygen Delivery Method: Circle system utilized Preoxygenation: Pre-oxygenation with 100% oxygen Induction Type: IV induction Ventilation: Mask ventilation without difficulty Laryngoscope Size: Mac and 4 Grade View: Grade II Tube type: Oral Tube size: 7.0 mm Number of attempts: 1 Airway Equipment and Method: Stylet and Oral airway Placement Confirmation: ETT inserted through vocal cords under direct vision, positive ETCO2 and breath sounds checked- equal and bilateral Secured at: 23 cm Tube secured with: Tape Dental Injury: Teeth and Oropharynx as per pre-operative assessment

## 2021-12-06 ENCOUNTER — Encounter (HOSPITAL_COMMUNITY): Payer: Self-pay | Admitting: Surgery

## 2021-12-06 ENCOUNTER — Encounter: Payer: Self-pay | Admitting: *Deleted

## 2021-12-10 LAB — SURGICAL PATHOLOGY

## 2021-12-11 ENCOUNTER — Encounter: Payer: Self-pay | Admitting: *Deleted

## 2021-12-11 DIAGNOSIS — C50412 Malignant neoplasm of upper-outer quadrant of left female breast: Secondary | ICD-10-CM

## 2021-12-11 DIAGNOSIS — Z170 Estrogen receptor positive status [ER+]: Secondary | ICD-10-CM

## 2021-12-12 ENCOUNTER — Encounter: Payer: Self-pay | Admitting: Genetic Counselor

## 2021-12-12 ENCOUNTER — Ambulatory Visit: Payer: Self-pay | Admitting: Genetic Counselor

## 2021-12-12 ENCOUNTER — Telehealth: Payer: Self-pay | Admitting: Genetic Counselor

## 2021-12-12 DIAGNOSIS — Z170 Estrogen receptor positive status [ER+]: Secondary | ICD-10-CM

## 2021-12-12 DIAGNOSIS — Z803 Family history of malignant neoplasm of breast: Secondary | ICD-10-CM

## 2021-12-12 DIAGNOSIS — C50411 Malignant neoplasm of upper-outer quadrant of right female breast: Secondary | ICD-10-CM

## 2021-12-12 DIAGNOSIS — Z1379 Encounter for other screening for genetic and chromosomal anomalies: Secondary | ICD-10-CM | POA: Insufficient documentation

## 2021-12-12 DIAGNOSIS — Z1509 Genetic susceptibility to other malignant neoplasm: Secondary | ICD-10-CM | POA: Insufficient documentation

## 2021-12-12 DIAGNOSIS — Z1501 Genetic susceptibility to malignant neoplasm of breast: Secondary | ICD-10-CM

## 2021-12-12 DIAGNOSIS — Z1589 Genetic susceptibility to other disease: Secondary | ICD-10-CM

## 2021-12-12 HISTORY — DX: Genetic susceptibility to malignant neoplasm of breast: Z15.01

## 2021-12-12 NOTE — Progress Notes (Signed)
GENETIC TEST RESULTS  Patient Name: Kelly Delgado Patient Age: 86 y.o. Encounter Date: 12/12/2021  Referring Provider: Benay Pike, MD  Kelly Delgado was seen in the [facility] clinic on November 27, 2021 due to a personal and family history of breast cancer and concern regarding a hereditary predisposition to cancer in the family. Please refer to the prior Genetics clinic note for more information regarding Kelly Delgado's medical and family histories and our assessment at the time.   FAMILY HISTORY:  We obtained a detailed, 4-generation family history.  Significant diagnoses are listed below:      Family History  Problem Relation Age of Onset   Cancer Maternal Aunt          unknown type; dx after 75   Leukemia Maternal Uncle     Breast cancer Half-Brother 7   Breast cancer Cousin          maternal female cousin; dx 60s   Breast cancer Cousin          maternal female cousin; dx 67s      Kelly Delgado is unaware of previous family history of genetic testing for hereditary cancer risks. There is no reported Ashkenazi Jewish ancestry. There is no known consanguinity.  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 Specifically, this variant is CHEK2  c.1100delC ([id number]).  No other pathogenic variants were detected in the [facility] CustomNext-Cancer +RNAinsight Panel.    The CustomNext-Cancer+RNAinsight panel offered by [facility] includes sequencing and rearrangement analysis for the following 47 genes:  APC, ATM, AXIN2, BARD1, BMPR1A, BRCA1, BRCA2, BRIP1, CDH1, CDK4, CDKN2A, CHEK2, DICER1, EPCAM, GREM1, HOXB13, MEN1, MLH1, MSH2, MSH3, MSH6, MUTYH, NBN, NF1, NF2, NTHL1, PALB2, PMS2, POLD1, POLE, PTEN, RAD51C, RAD51D, RECQL, RET, SDHA, SDHAF2, SDHB, SDHC, SDHD, SMAD4, SMARCA4, STK11, TP53, TSC1, TSC2, and VHL.  RNA data is routinely analyzed for use in variant interpretation for all genes.   The test report has been scanned  into EPIC and is located under the Molecular Pathology section of the Results Review tab.  A portion of the result report is included below for reference. Genetic testing reported out on October 10, 2022.     Genetic testing also identified a variant of uncertain significance (VUS) in the MSH2 gene called p.S87C (c.260C>G).  At this time, it is unknown if this variant is associated with increased cancer risk or if this is a normal finding, but most variants such as this get reclassified to being inconsequential. It should not be used to make medical management decisions. With time, we suspect the lab will determine the significance of this variant, if any. If we do learn more about it, we will try to [email or web address] to discuss it further. However, it is important to stay in touch with [place] periodically and keep the address and phone number up to date.   Cancer Risks for CHEK2: Women have a 20-40% lifetime risk of breast cancer. Men are thought to be at an increased risk of prostate cancer. The exact risk figure is unknown at this time. 8-9% lifetime risk of colorectal cancer Data suggests a possible increased risk for ovarian, endometrial, thyroid, and other types of cancer    Research is continuing to help learn more about the cancers associated with CHEK2 pathogenic variants and what the exact risks are to develop these cancers.  Management Recommendations:  Breast Cancer Screening/Risk Reduction:  Women: Breast cancer screening includes: Breast awareness beginning at  age 60 Monthly self-breast examination beginning at age 31 Clinical breast examination every 6-12 months beginning at age 47 or at the age of the earliest diagnosed breast cancer in the family, if onset was before age 53 Annual mammogram with consideration of tomosynthesis starting at age 10 or 26 years prior to the youngest age of diagnosis, whichever comes first Consider breast MRI with contrast starting at age  50-35 Evidence is insufficient for a prophylactic risk-reducing mastectomy, manage based on family history   Men: There are currently no specific medical management guidelines for female breast cancer. However, as an association has been seen with CHEK2 mutations and female breast cancer, we recommend males consider annual clinical breast exams beginning at age 62.  Colon Cancer Screening: Colonoscopy screening every 5 years beginning at age 85 If an individual has a first-degree relative with colorectal cancer, screening should begin 10 years prior to the relatives age at diagnosis if before 72. If an individual has a personal history of colorectal cancer, screening recommendations should be based on recommendations for post-colorectal cancer resection. Studies have demonstrated that the use of daily aspirin decreases the colorectal cancer risk in patients with an increased risk for colorectal cancer. Ongoing studies are investigating the optimal dose and duration of use of aspirin for colon cancer prevention. The decision to use aspirin should be made on an individualized basis including a discussion of dose, benefits, and adverse effects.   Prostate Cancer Screening: It has been suggested that men with a CHEK2 pathogenic variant and a first-degree relative with prostate cancer have an annual prostate-specific antigen (PSA). However, the benefits of screening for prostate cancer among men with a pathogenic variant in CHEK2 are uncertain. Consider beginning annual PSA blood test and digital rectal exams at age 38-45   This information is based on current understanding of the gene and may change in the future.   Implications for Family Members: Hereditary predisposition to cancer due to pathogenic variants in the CHEK2 gene has autosomal dominant inheritance. This means that an individual with a pathogenic variant has a 50% chance of passing the condition on to his/her offspring. Identification of  a pathogenic variant allows for the recognition of at-risk relatives who can pursue testing for the familial variant.   Family members are encouraged to consider genetic testing for this familial pathogenic variant. As there are generally no childhood cancer risks associated with pathogenic variants in the CHEK2 gene, individuals in the family are not recommended to have testing until they reach at least 84 years of age. Complimentary testing for the familial variant is available for 90 days after the report date, December 11, 2021. They may contact our office at [phone] for more information or to schedule an appointment. Family members who live outside of the area are encouraged to find a genetic counselor in their area by visiting: [email or web address].   Resources: FORCE (Facing Our Risk of Cancer Empowered) is a resource for those with a hereditary predisposition to develop cancer.  FORCE provides information about risk reduction, advocacy, legislation, and clinical trials.  Additionally, FORCE provides a platform for collaboration and support; which includes: peer navigation, message boards, local support groups, a toll-free helpline, research registry and recruitment, advocate training, published medical research, webinars, brochures, mastectomy photos, and more.  For more information, visit www.facingourrisk.org  Plan:  Ms. Printup's medical oncology, Dr. Chryl Heck, and PCP, Marda Stalker, PA-C, were forwarded these results.  We discussed with Ms. Sypher that future cancer surveillance should take into account  personalized factors, including her age and personal history. Of note, she reports her most recent colonoscopy was more than 10 years ago.  Ms. Lashway will be sent a letter detailing this mutation so that she can share this information with family.   Our contact number was provided. Ms. Osmun questions were answered to her satisfaction, and she knows she is  welcome to call us at anytime with additional questions or concerns.   Iasha Mccalister M. Joette Catching, [place], [facility] Certified Genetic Counselor [email or web address] (P) [phone]

## 2021-12-12 NOTE — Telephone Encounter (Signed)
Revealed CHEK2 mutation, breast and colon cancer risks, management strategies, and implications for family members.

## 2021-12-16 ENCOUNTER — Encounter: Payer: Self-pay | Admitting: *Deleted

## 2021-12-20 ENCOUNTER — Encounter: Payer: Self-pay | Admitting: Hematology and Oncology

## 2021-12-20 ENCOUNTER — Other Ambulatory Visit: Payer: Self-pay

## 2021-12-20 ENCOUNTER — Ambulatory Visit: Payer: Medicare Other | Admitting: Hematology and Oncology

## 2021-12-20 ENCOUNTER — Inpatient Hospital Stay: Payer: Medicare Other | Admitting: Hematology and Oncology

## 2021-12-20 DIAGNOSIS — Z170 Estrogen receptor positive status [ER+]: Secondary | ICD-10-CM

## 2021-12-20 DIAGNOSIS — Z87891 Personal history of nicotine dependence: Secondary | ICD-10-CM | POA: Diagnosis not present

## 2021-12-20 DIAGNOSIS — C50411 Malignant neoplasm of upper-outer quadrant of right female breast: Secondary | ICD-10-CM | POA: Diagnosis not present

## 2021-12-20 DIAGNOSIS — J449 Chronic obstructive pulmonary disease, unspecified: Secondary | ICD-10-CM | POA: Diagnosis not present

## 2021-12-20 DIAGNOSIS — I10 Essential (primary) hypertension: Secondary | ICD-10-CM | POA: Diagnosis not present

## 2021-12-20 DIAGNOSIS — Z803 Family history of malignant neoplasm of breast: Secondary | ICD-10-CM | POA: Diagnosis not present

## 2021-12-20 NOTE — Assessment & Plan Note (Addendum)
This is a very pleasant 86 year old female patient with past medical history significant for COPD, hypertension referred to medical oncology for new diagnosis of right breast invasive ductal carcinoma noted on a screening mammogram.  During her initial visit, given her strong ER/PR positive small tumor with grade 2, we have discussed about proceeding with upfront surgery followed by discussion about adjuvant radiation and antiestrogen therapy.  I do not believe she will have significant benefit from chemotherapy.  She agrees that she is 16, she really would like to focus on quality of life and would not want to take any medications that impair her quality of life.  She has a follow-up with Dr. Lisbeth Renshaw mid March for discussion of adjuvant radiation.  We have once again discussed about antiestrogen therapy.  We have discussed options for antiestrogen therapy today.  With regards to Tamoxifen, we discussed that this is a SERM, selective estrogen receptor modulator. We discussed mechanism of action of Tamoxifen, adverse effects on Tamoxifen including but not limited to post menopausal symptoms, increased risk of DVT/PE, increased risk of endometrial cancer, questionable cataracts with long term use and increased risk of cardiovascular events in the study which was not statistically significant. A benefit from Tamoxifen would be improvement in bone density. With regards to aromatase inhibitors, we discussed mechanism of action, adverse effects including but not limited to post menopausal symptoms, arthralgias, myalgias, increased risk of cardiovascular events and bone loss.   Printed material was given to the patient today.  She would like to review it and return to clinic with [place] in first week of April to discuss her decision.

## 2021-12-20 NOTE — Progress Notes (Signed)
[facility] NOTE  Patient Care Team: Marda Stalker, PA-C as PCP - General (Family Medicine) Brand Males, MD as Consulting Physician (Pulmonary Disease)  CHIEF COMPLAINTS/PURPOSE OF CONSULTATION:  Newly diagnosed breast cancer  HISTORY OF PRESENTING ILLNESS:  Kelly Delgado 85 y.o. female is here because of recent diagnosis of right breast IDC  This is a pleasant 86 year old female patient with past medical history of COPD, hypertension who presented after an abnormal screening mammogram. 10/04/2021 Screening mammogram, possible focal asymmetry and calcifications in right breast Indeterminate 1.1 cm upper right breast mass, tissue sampling is recommended. 0.7 cm group of likely benign UPPER-OUTER RIGHT breast calcifications. If biopsy of the UPPER RIGHT breast mass demonstrates atypia or malignancy, then 3D/stereotactic biopsy of these calcifications is recommended as these are new. Otherwise six-month follow-up is recommended. No abnormal appearing RIGHT axillary lymph nodes. US guided biopsy was recommended. US guided breast biopsy showed IDC with extracellular mucin, DCIS. Prognostics show ER 100 % positive, strong staining intensity, PR 100 % positive, strong staining intensity KI1 %, Her 2 neg,  I reviewed her records extensively and collaborated the history with the patient.  SUMMARY OF ONCOLOGIC HISTORY: Oncology History  Malignant neoplasm of upper-outer quadrant of right breast in female, estrogen receptor positive ([facility])  11/25/2021 Initial Diagnosis   Malignant neoplasm of upper-outer quadrant of right breast in female, estrogen receptor positive ([place])   11/27/2021 Cancer Staging   Staging form: Breast, AJCC 8th Edition - Clinical stage from 11/27/2021: Stage IA (cT1c, cN0, cM0, G2, ER+, PR+, HER2-) - Signed by Hayden Pedro, PA-C on 11/27/2021 Stage prefix: Initial diagnosis Method of lymph node assessment: Clinical Histologic grading  system: 3 grade system    12/05/2021 Surgery   Right breast lumpectomy showed invasive ductal carcinoma with extracellular mucin, 1.2 cm, grade 2.  Ductal carcinoma in situ.  All surgical margins negative for carcinoma, invasive carcinoma 0.2 cm from inferior margin lymph nodes not submitted.  Initial prognostic showed ER 100% positive strong staining, PR 100% positive strong staining, HER2 equivocal with IHC.  Negative with FISH ratio 1.28, Ki-67 1%    12/11/2021 Genetic Testing   Pathogenic variant detected in CHEK2 at c.1100delC ([id number]).  Variant of uncertain significance detected in MSH2 at  p.S87C (c.260C>G).  The report date is 12/11/2021.   The CustomNext-Cancer+RNAinsight panel offered by [facility] includes sequencing and rearrangement analysis for the following 47 genes:  APC, ATM, AXIN2, BARD1, BMPR1A, BRCA1, BRCA2, BRIP1, CDH1, CDK4, CDKN2A, CHEK2, DICER1, EPCAM, GREM1, HOXB13, MEN1, MLH1, MSH2, MSH3, MSH6, MUTYH, NBN, NF1, NF2, NTHL1, PALB2, PMS2, POLD1, POLE, PTEN, RAD51C, RAD51D, RECQL, RET, SDHA, SDHAF2, SDHB, SDHC, SDHD, SMAD4, SMARCA4, STK11, TP53, TSC1, TSC2, and VHL.  RNA data is routinely analyzed for use in variant interpretation for all genes.     Patient is postlumpectomy and is here for follow-up She has done remarkably well since the surgery.  She has only needed 1 dose of Tylenol for pain control.  No other complaints. MEDICAL HISTORY:  Past Medical History:  Diagnosis Date   Anxiety    Situatual   Asthmatic bronchitis    Basal cell carcinoma of breast 1990's?   "left side, burned it off" (06/07/2013)   Breast cancer ([place]) 11/05/2021   COPD (chronic obstructive pulmonary disease) (HCC)    GERD (gastroesophageal reflux disease)    H/O hiatal hernia    Hypertension    Monoallelic mutation of CHEK2 gene in female patient 12/12/2021   Osteoporosis  Schatzki's ring    Shortness of breath    "related to asthmatic bronchitis only" (06/07/2013)    SURGICAL  HISTORY: Past Surgical History:  Procedure Laterality Date   ABDOMINAL HYSTERECTOMY  06/28/1979   BREAST BIOPSY Right 11/05/2021   BREAST LUMPECTOMY WITH RADIOACTIVE SEED LOCALIZATION Right 12/05/2021   Procedure: RIGHT BREAST LUMPECTOMY WITH RADIOACTIVE SEED LOCALIZATION;  Surgeon: Donnie Mesa, MD;  Location: [facility];  Service: General;  Laterality: Right;   CATARACT EXTRACTION W/ INTRAOCULAR LENS  IMPLANT, BILATERAL Bilateral 06/27/1989   DILATION AND CURETTAGE OF UTERUS  06/28/1979   "1" (06/07/2013)   ESOPHAGEAL DILATION  10/27/2010   "just once" (06/07/2013)   HIP PINNING,CANNULATED Right 10/09/2014   Procedure: CANNULATED HIP PINNING;  Surgeon: Renette Butters, MD;  Location: [facility];  Service: Orthopedics;  Laterality: Right;   URETHRAL DIVERTICULUM REPAIR  06/27/1969    SOCIAL HISTORY: Social History   Socioeconomic History   Marital status: Married    Spouse name: Not on file   Number of children: Not on file   Years of education: Not on file   Highest education level: Not on file  Occupational History   Not on file  Tobacco Use   Smoking status: Former    Packs/day: 0.33    Years: 13.00    Pack years: 4.29    Types: Cigarettes    Quit date: 11/27/1968    Years since quitting: 53.0   Smokeless tobacco: Never  Vaping Use   Vaping Use: Never used  Substance and Sexual Activity   Alcohol use: Yes   Drug use: No   Sexual activity: Yes  Other Topics Concern   Not on file  Social History Narrative   Not on file   Social Determinants of Health   Financial Resource Strain: Not on file  Food Insecurity: Not on file  Transportation Needs: Not on file  Physical Activity: Not on file  Stress: Not on file  Social Connections: Not on file  Intimate Partner Violence: Not At Risk   Fear of Current or Ex-Partner: No   Emotionally Abused: No   Physically Abused: No   Sexually Abused: No    FAMILY HISTORY: Family History  Problem Relation Age of Onset   Cancer Mother     Stroke Mother    Hypertension Mother    Prostate cancer Father    Cancer Maternal Aunt        unknown type; dx after 74   Leukemia Maternal Uncle    Breast cancer Cousin        maternal female cousin; dx 58s   Breast cancer Cousin        maternal female cousin; dx 30s   Breast cancer Half-Brother 31    ALLERGIES:  is allergic to ezetimibe, statins, alendronate sodium, ibandronic acid, and spiriva [tiotropium bromide monohydrate].  MEDICATIONS:  Current Outpatient Medications  Medication Sig Dispense Refill   albuterol (PROVENTIL) (2.5 MG/3ML) 0.083% nebulizer solution Take 49m by nebulization every 6 hours as needed for wheezing or SOB 75 mL 6   albuterol (VENTOLIN HFA) 108 (90 Base) MCG/ACT inhaler Inhale 2 puffs into the lungs every 6 (six) hours as needed for wheezing or shortness of breath. 18 each 3   ALPRAZolam (XANAX) 0.25 MG tablet Take 0.25 mg by mouth daily as needed.     Ascorbic Acid (VITAMIN C PO) Take 1 tablet by mouth daily.     Budeson-Glycopyrrol-Formoterol (BREZTRI AEROSPHERE) 160-9-4.8 MCG/ACT AERO INHALE 2 PUFFS INTO THE  LUNGS IN THE MORNING AND AT BEDTIME. 10.7 g 5   Calcium Carb-Cholecalciferol (CALCIUM 600 + D PO) Take 1 tablet by mouth every other day.      Cholecalciferol (VITAMIN D-3 PO) Take 1 capsule by mouth daily.     fluticasone (FLONASE) 50 MCG/ACT nasal spray Place 1 spray into both nostrils daily as needed for allergies or rhinitis.     hydrochlorothiazide (HYDRODIURIL) 25 MG tablet Take 25 mg by mouth daily as needed (swelling).     Multiple Vitamin (MULTIVITAMIN WITH MINERALS) TABS tablet Take 1 tablet by mouth daily.     No current facility-administered medications for this visit.    REVIEW OF SYSTEMS:   Constitutional: Denies fevers, chills or abnormal night sweats Eyes: Denies blurriness of vision, double vision or watery eyes Ears, nose, mouth, throat, and face: Denies mucositis or sore throat Respiratory: Denies cough, dyspnea or  wheezes Cardiovascular: Denies palpitation, chest discomfort or lower extremity swelling Gastrointestinal:  Denies nausea, heartburn or change in bowel habits Skin: Denies abnormal skin rashes Lymphatics: Denies new lymphadenopathy or easy bruising Neurological:Denies numbness, tingling or new weaknesses Behavioral/Psych: Mood is stable, no new changes  Breast: No breast changes reported until after the biopsy site. All other systems were reviewed with the patient and are negative.  PHYSICAL EXAMINATION: ECOG PERFORMANCE STATUS: 0 - Asymptomatic  Vitals:   12/20/21 1313  BP: (!) 163/74  Pulse: 75  Resp: 16  Temp: 98.1 F (36.7 C)  SpO2: 97%    Filed Weights   12/20/21 1313  Weight: 181 lb 8 oz (82.3 kg)     GENERAL:alert, no distress and comfortable Breast: Left breast healing well.  LABORATORY DATA:  I have reviewed the data as listed Lab Results  Component Value Date   WBC 9.6 11/28/2021   HGB 15.2 (H) 11/28/2021   HCT 46.5 (H) 11/28/2021   MCV 90.8 11/28/2021   PLT 242 11/28/2021   Lab Results  Component Value Date   NA 141 11/28/2021   K 3.8 11/28/2021   CL 105 11/28/2021   CO2 27 11/28/2021    RADIOGRAPHIC STUDIES: I have personally reviewed the radiological reports and agreed with the findings in the report.  ASSESSMENT AND PLAN:  Malignant neoplasm of upper-outer quadrant of right breast in female, estrogen receptor positive ([place]) This is a very pleasant 86 year old female patient with past medical history significant for COPD, hypertension referred to medical oncology for new diagnosis of right breast invasive ductal carcinoma noted on a screening mammogram.  During her initial visit, given her strong ER/PR positive small tumor with grade 2, we have discussed about proceeding with upfront surgery followed by discussion about adjuvant radiation and antiestrogen therapy.  I do not believe she will have significant benefit from chemotherapy.  She agrees  that she is 71, she really would like to focus on quality of life and would not want to take any medications that impair her quality of life.  She has a follow-up with Dr. Lisbeth Renshaw mid March for discussion of adjuvant radiation.  We have once again discussed about antiestrogen therapy.  We have discussed options for antiestrogen therapy today.  With regards to Tamoxifen, we discussed that this is a SERM, selective estrogen receptor modulator. We discussed mechanism of action of Tamoxifen, adverse effects on Tamoxifen including but not limited to post menopausal symptoms, increased risk of DVT/PE, increased risk of endometrial cancer, questionable cataracts with long term use and increased risk of cardiovascular events in the study which  was not statistically significant. A benefit from Tamoxifen would be improvement in bone density. With regards to aromatase inhibitors, we discussed mechanism of action, adverse effects including but not limited to post menopausal symptoms, arthralgias, myalgias, increased risk of cardiovascular events and bone loss.   Printed material was given to the patient today.  She would like to review it and return to clinic with [place] in first week of April to discuss her decision.    Total time spent: 30 minutes  All questions were answered. The patient knows to call the clinic with any problems, questions or concerns.    Benay Pike, MD 12/20/21

## 2021-12-24 ENCOUNTER — Telehealth: Payer: Self-pay | Admitting: Acute Care

## 2021-12-24 MED ORDER — PREDNISONE 10 MG PO TABS: ORAL_TABLET | ORAL | 0 refills | Status: DC

## 2021-12-24 NOTE — Telephone Encounter (Signed)
Please take prednisone 40 mg x1 day, then 30 mg x1 day, then 20 mg x1 day, then 10 mg x1 day, and then 5 mg x1 day and stop  OR she can do 8 day   Please take prednisone 40 mg x1 day, then 30 mg x1 day, then 20 mg x1 day, then 10 mg x1 day, and then 5 mg x1 day and stop   Dx AECOPD  Hope she feels well soon

## 2021-12-24 NOTE — Telephone Encounter (Signed)
Spoke to patient.  She is requesting a Rx for prednisone.  C/o sob with exertion and laying flat, prod cough with clear sputum and wheezing x1w Denied f/c/s or additional sx.  She is using albuterol solution Q6H, albuterol HFA Q4H and Breztri BID.  Recent right breast lumpectomy 11/29/2021.  Dr. Chase Caller, please advise. Thanks

## 2021-12-24 NOTE — Telephone Encounter (Signed)
Patient is aware of recommendations and voiced her understanding.  °Prednisone sent to preferred pharmacy.  °Nothing further needed.  ° °

## 2022-01-07 ENCOUNTER — Telehealth: Payer: Self-pay

## 2022-01-07 NOTE — Progress Notes (Signed)
?Radiation Oncology         [phone] ?________________________________ ? ?Name: Kelly Delgado        MRN: [id number]  ?Date of Service: 01/08/2022 DOB: [birth year] ? ?WJ:[id number], Rubbie Battiest, MD    ? ?REFERRING PHYSICIAN: Benay Pike, MD ? ? ?DIAGNOSIS: The encounter diagnosis was Malignant neoplasm of upper-outer quadrant of right breast in female, estrogen receptor positive ([place]). ? ? ?HISTORY OF PRESENT ILLNESS: Kelly Delgado is a 86 y.o. female originally seen in the multidisciplinary breast clinic for a new diagnosis of left breast cancer. The patient was noted to have  abnormality in the right breast diagnostic imaging showed a 7 mm area of coarse calcific lesions in the upper outer right breast and targeted ultrasound showed mass with possible cystic change in the 12 o'clock position of the right breast.  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 ? ?Her went right lumpectomy on 12/05/2021 and pathology showed a 1.2 cm invasive ductal carcinoma with extracellular mucin that was overall grade 2 associated DCIS was present and all surgical margins were negative, the closest margin was 2 mm for invasive disease to the inferior edge.  No additional surgery is planned.  She is seen today via mychart to discuss adjuvant radiotherapy. ? ? ? ?PREVIOUS RADIATION THERAPY: No ? ? ?PAST MEDICAL HISTORY:  ?Past Medical History:  ?Diagnosis Date  ? Anxiety   ? Situatual  ? Asthmatic bronchitis   ? Basal cell carcinoma of breast 1990's?  ? "left side, burned it off" (06/07/2013)  ? Breast cancer ([place]) 11/05/2021  ? COPD (chronic obstructive pulmonary disease) ([place])   ? GERD  (gastroesophageal reflux disease)   ? H/O hiatal hernia   ? Hypertension   ? 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 Osteoporosis   ? Schatzki's ring   ? Shortness of breath   ? "related to asthmatic bronchitis only" (06/07/2013)  ?   ? ? ?PAST SURGICAL HISTORY: ?Past Surgical History:  ?Procedure Laterality Date  ? ABDOMINAL HYSTERECTOMY  06/28/1979  ? BREAST BIOPSY Right 11/05/2021  ? BREAST LUMPECTOMY WITH RADIOACTIVE SEED LOCALIZATION Right 12/05/2021  ? Procedure: RIGHT BREAST LUMPECTOMY WITH RADIOACTIVE SEED LOCALIZATION;  Surgeon: Donnie Mesa, MD;  Location: [facility];  Service: General;  Laterality: Right;  ? CATARACT EXTRACTION W/ INTRAOCULAR LENS  IMPLANT, BILATERAL Bilateral 06/27/1989  ? DILATION AND CURETTAGE OF UTERUS  06/28/1979  ? "1" (06/07/2013)  ? ESOPHAGEAL DILATION  10/27/2010  ? "just once" (06/07/2013)  ? HIP PINNING,CANNULATED Right 10/09/2014  ? Procedure: CANNULATED HIP PINNING;  Surgeon: Renette Butters, MD;  Location: [facility];  Service: Orthopedics;  Laterality: Right;  ? URETHRAL DIVERTICULUM REPAIR  06/27/1969  ? ? ? ?FAMILY HISTORY:  ?Family History  ?Problem Relation Age of Onset  ? Cancer Mother   ? Stroke Mother   ? Hypertension Mother   ? Prostate cancer Father   ? Cancer Maternal Aunt   ?     unknown type; dx after 40  ? Leukemia Maternal Uncle   ? Breast cancer Cousin   ?     maternal female cousin; dx 56s  ? Breast cancer Cousin   ?  maternal female cousin; dx 34s  ? Breast cancer Half-Brother 9  ? ? ? ?SOCIAL HISTORY:  reports that she quit smoking about 53 years ago. Her smoking use included cigarettes. She has a 4.29 pack-year smoking history. She has never used smokeless tobacco. She reports current alcohol use. She reports that she does not use drugs.  The patient is married and lives in [place].  She is retired and enjoys spending time at home, traveling to [employer] and traveling to [place] to see family. ? ? ?ALLERGIES: Ezetimibe, Statins,  Alendronate sodium, Ibandronic acid, and Spiriva [tiotropium bromide monohydrate] ? ? ?MEDICATIONS:  ?Current Outpatient Medications  ?Medication Sig Dispense Refill  ? albuterol (PROVENTIL) (2.5 MG/3ML) 0.083% nebulizer solution Take 39m by nebulization every 6 hours as needed for wheezing or SOB 75 mL 6  ? albuterol (VENTOLIN HFA) 108 (90 Base) MCG/ACT inhaler Inhale 2 puffs into the lungs every 6 (six) hours as needed for wheezing or shortness of breath. 18 each 3  ? ALPRAZolam (XANAX) 0.25 MG tablet Take 0.25 mg by mouth daily as needed.    ? Ascorbic Acid (VITAMIN C PO) Take 1 tablet by mouth daily.    ? Budeson-Glycopyrrol-Formoterol (BREZTRI AEROSPHERE) 160-9-4.8 MCG/ACT AERO INHALE 2 PUFFS INTO THE LUNGS IN THE MORNING AND AT BEDTIME. 10.7 g 5  ? Calcium Carb-Cholecalciferol (CALCIUM 600 + D PO) Take 1 tablet by mouth every other day.     ? Cholecalciferol (VITAMIN D-3 PO) Take 1 capsule by mouth daily.    ? fluticasone (FLONASE) 50 MCG/ACT nasal spray Place 1 spray into both nostrils daily as needed for allergies or rhinitis.    ? hydrochlorothiazide (HYDRODIURIL) 25 MG tablet Take 25 mg by mouth daily as needed (swelling).    ? Multiple Vitamin (MULTIVITAMIN WITH MINERALS) TABS tablet Take 1 tablet by mouth daily.    ? predniSONE (DELTASONE) 10 MG tablet 4tab x1d, 3tabx1d, 2tabx1d, 1tabx1d, 0.5tabx1d. 11 tablet 0  ? ?No current facility-administered medications for this visit.  ? ? ? ?REVIEW OF SYSTEMS: On review of systems, the patient reports that she is doing very well with healing. She did have a COPD exacerbation and just finished steroids for this. Her symptoms of shortness of breath have resolved. ? ?  ? ?PHYSICAL EXAM:  ?Unable to assess vitals due to encounter type. ? ?In general this is a well appearing Caucasian female in no acute distress.  She's alert and oriented x4 and appropriate throughout the examination. Cardiopulmonary assessment is negative for acute distress and she exhibits normal  effort.Breast exam is deferred. ? ? ? ? ?ECOG = 0 ? ?0 - Asymptomatic (Fully active, able to carry on all predisease activities without restriction) ? ?1 - Symptomatic but completely ambulatory (Restricted in physically strenuous activity but ambulatory and able to carry out work of a light or sedentary nature. For example, light housework, office work) ? ?2 - Symptomatic, <50% in bed during the day (Ambulatory and capable of all self care but unable to carry out any work activities. Up and about more than 50% of waking hours) ? ?3 - Symptomatic, >50% in bed, but not bedbound (Capable of only limited self-care, confined to bed or chair 50% or more of waking hours) ? ?4 - Bedbound (Completely disabled. Cannot carry on any self-care. Totally confined to bed or chair) ? ?5 - Death ? ? Oken MM, Creech RH, Tormey DC, et al. ([id number]. "Toxicity and response criteria of the [facility]". [place]  Oncol. 5 (6): 649-55 ? ? ? ?LABORATORY DATA:  ?Lab Results  ?Component Value Date  ? WBC 9.6 11/28/2021  ? HGB 15.2 (H) 11/28/2021  ? HCT 46.5 (H) 11/28/2021  ? MCV 90.8 11/28/2021  ? PLT 242 11/28/2021  ? ?Lab Results  ?Component Value Date  ? NA 141 11/28/2021  ? K 3.8 11/28/2021  ? CL 105 11/28/2021  ? CO2 27 11/28/2021  ? ?Lab Results  ?Component Value Date  ? ALT 27 11/28/2021  ? AST 30 11/28/2021  ? ALKPHOS 176 (H) 11/28/2021  ? BILITOT 0.7 11/28/2021  ? ?  ? ?RADIOGRAPHY: No results found. ?   ? ?IMPRESSION/PLAN: ?1. Stage IA, pT1c, cN0M0 grade 2, ER/PR positive invasive ductal carcinoma of the right breast. Dr. Lisbeth Renshaw has reviewed her course and I reviewed her final pathology findings and reviews the nature of early stage breast disease.  She has done well since surgery.  We reviewed the rationale  for external radiotherapy to the breast  to reduce risks of local recurrence followed by antiestrogen therapy. Dr. Lisbeth Renshaw has previously reviewed with her cases in which radiation may be optional for  favorable cases based on final pathology.  In this case given her clear margins T staging, estrogen positive disease and age, she would be a candidate to forego treatment.  We discussed the risks, benefits, short, and lo

## 2022-01-07 NOTE — Telephone Encounter (Signed)
Spoke w/ patient, verified identity, and reminded patient of her 9:00am-01/08/22 "My Chart" video appointment w/ Shona Simpson PA-C. I left my extension [phone] in case patient needs anything. Patient verbalized understanding of information. ?

## 2022-01-08 ENCOUNTER — Encounter: Payer: Self-pay | Admitting: Radiation Oncology

## 2022-01-08 ENCOUNTER — Ambulatory Visit
Admission: RE | Admit: 2022-01-08 | Discharge: 2022-01-08 | Disposition: A | Discharge status: Home / Self Care | Payer: Medicare Other | Source: Ambulatory Visit | Attending: Radiation Oncology | Admitting: Radiation Oncology

## 2022-01-08 VITALS — Ht 67.5 in | Wt 182.0 lb

## 2022-01-08 DIAGNOSIS — C50411 Malignant neoplasm of upper-outer quadrant of right female breast: Secondary | ICD-10-CM | POA: Diagnosis not present

## 2022-01-08 DIAGNOSIS — Z170 Estrogen receptor positive status [ER+]: Secondary | ICD-10-CM | POA: Diagnosis not present

## 2022-01-08 NOTE — Progress Notes (Signed)
Patient interviewed for "My-Chart Video FUN" appointment w/ Alveta Heimlich PA-C. I spoke w/ patient, verified identity, and began nursing interview. Patient states "She is doing well, and has no issues to report at this time." Denies any chest/ breast discomfort. ? ?Meaningful use complete. ? ?Patient is aware/ ready for her "My-Chart" Video call at 9:30am-01/08/22. I left my extension [phone] in case patient needs anything. ? ?Patient contact [phone] ?

## 2022-01-09 ENCOUNTER — Encounter (HOSPITAL_COMMUNITY): Payer: Self-pay

## 2022-01-09 ENCOUNTER — Ambulatory Visit: Payer: Medicare Other | Admitting: Radiation Oncology

## 2022-01-10 ENCOUNTER — Encounter: Payer: Self-pay | Admitting: *Deleted

## 2022-01-27 ENCOUNTER — Inpatient Hospital Stay: Payer: Medicare Other | Attending: Hematology and Oncology | Admitting: Hematology and Oncology

## 2022-01-27 ENCOUNTER — Encounter: Payer: Self-pay | Admitting: Hematology and Oncology

## 2022-01-27 DIAGNOSIS — Z170 Estrogen receptor positive status [ER+]: Secondary | ICD-10-CM | POA: Diagnosis not present

## 2022-01-27 DIAGNOSIS — I10 Essential (primary) hypertension: Secondary | ICD-10-CM | POA: Insufficient documentation

## 2022-01-27 DIAGNOSIS — Z87891 Personal history of nicotine dependence: Secondary | ICD-10-CM | POA: Diagnosis not present

## 2022-01-27 DIAGNOSIS — J449 Chronic obstructive pulmonary disease, unspecified: Secondary | ICD-10-CM | POA: Insufficient documentation

## 2022-01-27 DIAGNOSIS — Z90710 Acquired absence of both cervix and uterus: Secondary | ICD-10-CM | POA: Diagnosis not present

## 2022-01-27 DIAGNOSIS — C50411 Malignant neoplasm of upper-outer quadrant of right female breast: Secondary | ICD-10-CM | POA: Diagnosis not present

## 2022-01-27 DIAGNOSIS — Z8042 Family history of malignant neoplasm of prostate: Secondary | ICD-10-CM | POA: Insufficient documentation

## 2022-01-27 DIAGNOSIS — Z803 Family history of malignant neoplasm of breast: Secondary | ICD-10-CM | POA: Insufficient documentation

## 2022-01-27 DIAGNOSIS — Z806 Family history of leukemia: Secondary | ICD-10-CM | POA: Insufficient documentation

## 2022-01-27 MED ORDER — TAMOXIFEN CITRATE 20 MG PO TABS
20.0000 mg | ORAL_TABLET | Freq: Every day | ORAL | 3 refills | Status: DC
Start: 2022-01-27 — End: 2022-05-08

## 2022-01-27 NOTE — Progress Notes (Signed)
[facility] ?CONSULT NOTE ? ?Patient Care Team: ?Marda Stalker, PA-C as PCP - General (Family Medicine) ?Brand Males, MD as Consulting Physician (Pulmonary Disease) ? ?CHIEF COMPLAINTS/PURPOSE OF CONSULTATION:  ?Newly diagnosed breast cancer ? ?HISTORY OF PRESENTING ILLNESS:  ? ?Kelly Delgado 86 y.o. female is here because of recent diagnosis of right breast IDC ?I reviewed her records extensively and collaborated the history with the patient. ? ?SUMMARY OF ONCOLOGIC HISTORY: ?Oncology History  ?Malignant neoplasm of upper-outer quadrant of right breast in female, estrogen receptor positive ([place])  ?11/25/2021 Initial Diagnosis  ? Malignant neoplasm of upper-outer quadrant of right breast in female, estrogen receptor positive ([place]) ?  ?11/27/2021 Cancer Staging  ? Staging form: Breast, AJCC 8th Edition ?- Clinical stage from 11/27/2021: Stage IA (cT1c, cN0, cM0, G2, ER+, PR+, HER2-) - Signed by Hayden Pedro, PA-C on 11/27/2021 ?Stage prefix: Initial diagnosis ?Method of lymph node assessment: Clinical ?Histologic grading system: 3 grade system ? ?  ?12/05/2021 Surgery  ? Right breast lumpectomy showed invasive ductal carcinoma with extracellular mucin, 1.2 cm, grade 2.  Ductal carcinoma in situ.  All surgical margins negative for carcinoma, invasive carcinoma 0.2 cm from inferior margin lymph nodes not submitted.  Initial prognostic showed ER 100% positive strong staining, PR 100% positive strong staining, HER2 equivocal with IHC.  Negative with FISH ratio 1.28, Ki-67 1% ? ?  ?12/11/2021 Genetic Testing  ? Pathogenic variant detected in CHEK2 at c.1100delC ([id number]).  Variant of uncertain significance detected in MSH2 at  p.S87C (c.260C>G).  The report date is 12/11/2021.  ? ?The CustomNext-Cancer+RNAinsight panel offered by [facility] includes sequencing and rearrangement analysis for the following 47 genes:  APC, ATM, AXIN2, BARD1, BMPR1A, BRCA1, BRCA2, BRIP1, CDH1, CDK4, CDKN2A,  CHEK2, DICER1, EPCAM, GREM1, HOXB13, MEN1, MLH1, MSH2, MSH3, MSH6, MUTYH, NBN, NF1, NF2, NTHL1, PALB2, PMS2, POLD1, POLE, PTEN, RAD51C, RAD51D, RECQL, RET, SDHA, SDHAF2, SDHB, SDHC, SDHD, SMAD4, SMARCA4, STK11, TP53, TSC1, TSC2, and VHL.  RNA data is routinely analyzed for use in variant interpretation for all genes. ? ?  ? ?Interval history ? ?She elected to defer adjuvant radiation since this was an option.  She is interested in trying tamoxifen.  She tells me that she has read about both the medications and she was hoping that tamoxifen also helps her bone density.  She denies any new health problems.  She is hoping to go back to the [facility] for some walking.  Rest of the pertinent 10 point ROS reviewed and negative. ? ?MEDICAL HISTORY:  ?Past Medical History:  ?Diagnosis Date  ? Anxiety   ? Situatual  ? Asthmatic bronchitis   ? Basal cell carcinoma of breast 1990's?  ? "left side, burned it off" (06/07/2013)  ? Breast cancer ([place]) 11/05/2021  ? COPD (chronic obstructive pulmonary disease) ([place])   ? GERD (gastroesophageal reflux disease)   ? H/O hiatal hernia   ? Hypertension   ? 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 Osteoporosis   ? Schatzki's ring   ? Shortness of breath   ? "related to asthmatic bronchitis only" (06/07/2013)  ? ? ?SURGICAL HISTORY: ?Past Surgical History:  ?Procedure Laterality Date  ? ABDOMINAL HYSTERECTOMY  06/28/1979  ? BREAST BIOPSY Right 11/05/2021  ? BREAST LUMPECTOMY WITH RADIOACTIVE SEED LOCALIZATION Right 12/05/2021  ? Procedure: RIGHT BREAST LUMPECTOMY WITH RADIOACTIVE SEED LOCALIZATION;  Surgeon: Donnie Mesa, MD;  Location: [facility];  Service: General;  Laterality: Right;  ? CATARACT EXTRACTION W/  INTRAOCULAR LENS  IMPLANT, BILATERAL Bilateral 06/27/1989  ? DILATION AND CURETTAGE OF UTERUS  06/28/1979  ? "1" (06/07/2013)  ? ESOPHAGEAL DILATION  10/27/2010  ? "just once" (06/07/2013)  ? HIP PINNING,CANNULATED Right 10/09/2014  ? Procedure: CANNULATED HIP PINNING;   Surgeon: Renette Butters, MD;  Location: [facility];  Service: Orthopedics;  Laterality: Right;  ? URETHRAL DIVERTICULUM REPAIR  06/27/1969  ? ? ?SOCIAL HISTORY: ?Social History  ? ?Socioeconomic History  ? Marital status: Married  ?  Spouse name: Not on file  ? Number of children: Not on file  ? Years of education: Not on file  ? Highest education level: Not on file  ?Occupational History  ? Not on file  ?Tobacco Use  ? Smoking status: Former  ?  Packs/day: 0.33  ?  Years: 13.00  ?  Pack years: 4.29  ?  Types: Cigarettes  ?  Quit date: 11/27/1968  ?  Years since quitting: 53.2  ? Smokeless tobacco: Never  ?Vaping Use  ? Vaping Use: Never used  ?Substance and Sexual Activity  ? Alcohol use: Yes  ? Drug use: No  ? Sexual activity: Yes  ?Other Topics Concern  ? Not on file  ?Social History Narrative  ? Not on file  ? ?Social Determinants of Health  ? ?Financial Resource Strain: Not on file  ?Food Insecurity: Not on file  ?Transportation Needs: Not on file  ?Physical Activity: Not on file  ?Stress: Not on file  ?Social Connections: Not on file  ?Intimate Partner Violence: Not At Risk  ? Fear of Current or Ex-Partner: No  ? Emotionally Abused: No  ? Physically Abused: No  ? Sexually Abused: No  ? ? ?FAMILY HISTORY: ?Family History  ?Problem Relation Age of Onset  ? Cancer Mother   ? Stroke Mother   ? Hypertension Mother   ? Prostate cancer Father   ? Cancer Maternal Aunt   ?     unknown type; dx after 17  ? Leukemia Maternal Uncle   ? Breast cancer Cousin   ?     maternal female cousin; dx 14s  ? Breast cancer Cousin   ?     maternal female cousin; dx 50s  ? Breast cancer Half-Brother 4  ? ? ?ALLERGIES:  is allergic to ezetimibe, statins, alendronate sodium, ibandronic acid, and spiriva [tiotropium bromide monohydrate]. ? ?MEDICATIONS:  ?Current Outpatient Medications  ?Medication Sig Dispense Refill  ? tamoxifen (NOLVADEX) 20 MG tablet Take 1 tablet (20 mg total) by mouth daily. 30 tablet 3  ? albuterol (PROVENTIL) (2.5  MG/3ML) 0.083% nebulizer solution Take 63m by nebulization every 6 hours as needed for wheezing or SOB 75 mL 6  ? albuterol (VENTOLIN HFA) 108 (90 Base) MCG/ACT inhaler Inhale 2 puffs into the lungs every 6 (six) hours as needed for wheezing or shortness of breath. 18 each 3  ? ALPRAZolam (XANAX) 0.25 MG tablet Take 0.25 mg by mouth daily as needed.    ? Ascorbic Acid (VITAMIN C PO) Take 1 tablet by mouth daily.    ? Budeson-Glycopyrrol-Formoterol (BREZTRI AEROSPHERE) 160-9-4.8 MCG/ACT AERO INHALE 2 PUFFS INTO THE LUNGS IN THE MORNING AND AT BEDTIME. 10.7 g 5  ? Calcium Carb-Cholecalciferol (CALCIUM 600 + D PO) Take 1 tablet by mouth every other day.     ? Cholecalciferol (VITAMIN D-3 PO) Take 1 capsule by mouth daily.    ? fluticasone (FLONASE) 50 MCG/ACT nasal spray Place 1 spray into both nostrils daily as needed for  allergies or rhinitis.    ? hydrochlorothiazide (HYDRODIURIL) 25 MG tablet Take 25 mg by mouth daily as needed (swelling).    ? Multiple Vitamin (MULTIVITAMIN WITH MINERALS) TABS tablet Take 1 tablet by mouth daily.    ? ?No current facility-administered medications for this visit.  ? ? ? ?PHYSICAL EXAMINATION: ?ECOG PERFORMANCE STATUS: 0 - Asymptomatic ? ?Vitals:  ? 01/27/22 1310  ?BP: (!) 158/93  ?Pulse: 85  ?Resp: 16  ?Temp: 98.1 ?F (36.7 ?C)  ?SpO2: 96%  ? ? ? ?Filed Weights  ? 01/27/22 1310  ?Weight: 184 lb 6.4 oz (83.6 kg)  ? ? ?Physical exam deferred in lieu of counseling ?LABORATORY DATA:  ?I have reviewed the data as listed ?Lab Results  ?Component Value Date  ? WBC 9.6 11/28/2021  ? HGB 15.2 (H) 11/28/2021  ? HCT 46.5 (H) 11/28/2021  ? MCV 90.8 11/28/2021  ? PLT 242 11/28/2021  ? ?Lab Results  ?Component Value Date  ? NA 141 11/28/2021  ? K 3.8 11/28/2021  ? CL 105 11/28/2021  ? CO2 27 11/28/2021  ? ? ?RADIOGRAPHIC STUDIES: ?I have personally reviewed the radiological reports and agreed with the findings in the report. ? ?ASSESSMENT AND PLAN:  ?Malignant neoplasm of upper-outer quadrant of  right breast in female, estrogen receptor positive ([place]) ?This is a very pleasant 86 year old female patient with past medical history significant for COPD, hypertension referred to medical oncology for n

## 2022-01-27 NOTE — Assessment & Plan Note (Signed)
This is a very pleasant 86 year old female patient with past medical history significant for COPD, hypertension referred to medical oncology for new diagnosis of right breast invasive ductal carcinoma noted on a screening mammogram.  ?During her initial visit, given her strong ER/PR positive small tumor with grade 2, we have discussed about proceeding with upfront surgery followed by discussion about adjuvant radiation and antiestrogen therapy. ?She met with radiation oncology team and radiation was presented as an optional treatment hence she agreed to defer it.  She is here to start antiestrogen therapy.  In the past we have discussed about options including tamoxifen as well as antiestrogen therapy, mechanism of action and adverse effects from tamoxifen as well as antiestrogen therapy.  We have once again discussed about adverse effects from tamoxifen including postmenopausal symptoms, increased risk of DVT/PE, endometrial cancer in women with intact uterus, increased risk of cardiovascular events.  The benefit from tamoxifen would be improvement in bone density in addition to decrease risk of breast cancer.  We have discussed that tamoxifen is inferior to anastrozole and other aromatase inhibitors and efficacy however given the risks and benefits, I believe tamoxifen is a reasonable choice. ? ?She will return to clinic in 4 months for toxicity check.  She was encouraged to reach out to [place] with any new questions or concerns.  I have clearly explained to her symptoms of DVT/PE as well as the need to address this immediately. ? ? ?

## 2022-01-28 ENCOUNTER — Telehealth: Payer: Self-pay | Admitting: Hematology and Oncology

## 2022-01-28 NOTE — Telephone Encounter (Signed)
Scheduled appointment per 04/03 los. Patient aware. ?

## 2022-01-30 ENCOUNTER — Ambulatory Visit
Admission: RE | Admit: 2022-01-30 | Discharge: 2022-01-30 | Disposition: A | Discharge status: Home / Self Care | Payer: Medicare Other | Source: Ambulatory Visit | Attending: Family Medicine | Admitting: Family Medicine

## 2022-01-30 DIAGNOSIS — M810 Age-related osteoporosis without current pathological fracture: Secondary | ICD-10-CM | POA: Diagnosis not present

## 2022-01-30 DIAGNOSIS — Z780 Asymptomatic menopausal state: Secondary | ICD-10-CM | POA: Diagnosis not present

## 2022-02-11 ENCOUNTER — Telehealth: Payer: Self-pay | Admitting: Genetic Counselor

## 2022-02-11 NOTE — Telephone Encounter (Signed)
Received VM from Ms. Gorgas about setting up genetics appt for her granddaughter due to CHEK2 mutation.  Returned call and LVM requesting call back.  ?

## 2022-02-17 ENCOUNTER — Encounter: Payer: Self-pay | Admitting: Hematology and Oncology

## 2022-02-18 ENCOUNTER — Telehealth: Payer: Self-pay | Admitting: Hematology and Oncology

## 2022-02-18 NOTE — Telephone Encounter (Signed)
.  Called patient to schedule appointment per 4/25 inbasket, patient is aware of date and time.   ?

## 2022-02-25 ENCOUNTER — Encounter: Payer: Self-pay | Admitting: Acute Care

## 2022-02-25 ENCOUNTER — Ambulatory Visit: Payer: Medicare Other | Admitting: Acute Care

## 2022-02-25 VITALS — BP 140/76 | HR 78 | Temp 98.1°F | Ht 66.0 in | Wt 185.4 lb

## 2022-02-25 DIAGNOSIS — J441 Chronic obstructive pulmonary disease with (acute) exacerbation: Secondary | ICD-10-CM | POA: Diagnosis not present

## 2022-02-25 MED ORDER — PREDNISONE 10 MG PO TABS
ORAL_TABLET | ORAL | 0 refills | Status: DC
Start: 2022-02-25 — End: 2022-04-24

## 2022-02-25 NOTE — Patient Instructions (Addendum)
It is good to see you today.  ?We will send in a prednisone taper for your wheezing and coughing.  ?Prednisone taper; 10 mg tablets: 4 tabs x 2 days, 3 tabs x 2 days, 2 tabs x 2 days 1 tab x 2 days then stop.  ?Continue using Flonase as needed for allergies. ?Consider adding Claritin ( Loratadine ) once daily for allergies.  ?Try to get out and exercise as you are able. ?Call if you have any change in secretions and we can consider antibiotics.  ?Follow up in 3 months or sooner as needed . ?Call if you need [place] sooner.  ?Please contact office for sooner follow up if symptoms do not improve or worsen or seek emergency care   ?

## 2022-02-25 NOTE — Progress Notes (Signed)
? ?History of Present Illness ?Kelly Delgado is a 86 y.o. female with COPD stage 4, and recent breast cancer diagnosis. . She is followed by Dr. Chase Caller ?Maintenance Breztri ?Albuterol nebs prn  ? ? ?02/25/2022 ?Pt. Presents for follow up. She had a flare 11/11/2021, and was treated with Doxycycline and a prednisone taper. She had good response to treatment. She was last seen in the office 11/27/2021. She had a lumpectomy 12/05/2021 for her breast cancer, so plan was to do a 3 month follow up to make sure she was continuing to do well.  ?She states she has been doing ok. She has been having what she thinks is a flare due to allergies. She has had increased rescue inhaler , about 3 times daily. She has been wheezing. She is worried this is the beginning of a flare.  ?She is using Flonase and she is not using any Allegra or Claritin. Secretions are clear. No fever.  ?She had a lumpectomy on 2/9. She was started on Tamoxifen, but had such issues with fatigue she had to stop taking it. She is going back to oncology to discuss other options for treatment.  She is still tired from the medication. She has been off for 8 or 9 days. She is following up with oncology.  ? ?Test Results: ? ? ?  Latest Ref Rng & Units 11/28/2021  ?  2:52 PM 08/06/2021  ?  9:29 AM 07/17/2017  ? 10:13 PM  ?CBC  ?WBC 4.0 - 10.5 K/uL 9.6   6.7   9.0    ?Hemoglobin 12.0 - 15.0 Delgado/dL 15.2   13.6   14.4    ?Hematocrit 36.0 - 46.0 % 46.5   41.1   42.9    ?Platelets 150 - 400 K/uL 242   166.0   197    ? ? ? ?  Latest Ref Rng & Units 11/28/2021  ?  2:52 PM 07/17/2017  ? 10:13 PM 11/24/2016  ?  4:37 PM  ?BMP  ?Glucose 70 - 99 mg/dL 92   101     ?BUN 8 - 23 mg/dL 18   20     ?Creatinine 0.44 - 1.00 mg/dL 0.87   0.92   0.67    ?Sodium 135 - 145 mmol/L 141   137     ?Potassium 3.5 - 5.1 mmol/L 3.8   4.1     ?Chloride 98 - 111 mmol/L 105   104     ?CO2 22 - 32 mmol/L 27   24     ?Calcium 8.9 - 10.3 mg/dL 9.8   9.3     ? ? ?BNP ?No results found for: BNP ? ?ProBNP ?No  results found for: PROBNP ? ?PFT ?   ?Component Value Date/Time  ? FEV1PRE 1.31 09/12/2021 1053  ? FEV1POST 1.71 09/12/2021 1053  ? FVCPRE 1.92 09/12/2021 1053  ? FVCPOST 2.28 09/12/2021 1053  ? TLC 4.51 09/12/2021 1053  ? DLCOUNC 17.02 09/12/2021 1053  ? PREFEV1FVCRT 68 09/12/2021 1053  ? PSTFEV1FVCRT 75 09/12/2021 1053  ? ? ?DG BONE DENSITY (DXA) ? ?Result Date: 01/30/2022 ?EXAM: DUAL X-RAY ABSORPTIOMETRY (DXA) FOR BONE MINERAL DENSITY IMPRESSION: Referring Physician:  Marda Stalker Your patient completed a bone mineral density test using GE Lunar iDXA system (analysis version: 16). Technologist: [employer] PATIENT: Name: Kelly, Whitsell Delgado Patient ID: [id number] Birth Date: [birth year] Height: 66.0 in. Sex: Female Measured: 01/30/2022 Weight: 185.0 lbs. Indications: Advanced Age, Albuterol, Breast Cancer History, Caucasian, COPD, Estrogen Deficient,  Family Hist. (Parent hip fracture), Hysterectomy, Postmenopausal, Tamoxifen Fractures: Right Hip, Right Wrist Treatments: Calcium (E943.0), Vitamin D (E933.5) ASSESSMENT: The BMD measured at Forearm Radius 33% is 0.641 Delgado/cm2 with a T-score of -2.7. This patient is considered osteoporotic according to [facility]) criteria. The quality of the exam is good. The lumbar spine was excluded due to being excluded on prior exam. Right hip excluded due to surgical hardware. Site Region Measured Date Measured Age YA BMD Significant CHANGE T-score Left Forearm Radius 33% 01/30/2022 83.8 -2.7 0.641 Delgado/cm2 Left Forearm Radius 33% 12/30/2018 80.7 -2.8 0.640 Delgado/cm2 Left Femur Neck 01/30/2022 83.8 -2.5 0.692 Delgado/cm2 Left Femur Neck 12/30/2018 80.7 -2.3 0.722 Delgado/cm2 * Left Femur Total 01/30/2022 83.8 -2.0 0.760 Delgado/cm2 Left Femur Total 12/30/2018 80.7 -1.8 0.776 Delgado/cm2 * World Health Organization [facility]) criteria for post-menopausal, Caucasian Women: Normal       T-score at or above -1 SD Osteopenia   T-score between -1 and -2.5 SD Osteoporosis T-score at or below -2.5 SD  RECOMMENDATION: 1. All patients should optimize calcium and vitamin D intake. 2. Consider FDA-approved medical therapies in postmenopausal women and men aged 39 years and older, based on the following: a. A hip or vertebral (clinical or morphometric) fracture. b. T-score = -2.5 at the femoral neck or spine after appropriate evaluation to exclude secondary causes. c. Low bone mass (T-score between -1.0 and -2.5 at the femoral neck or spine) and a 10-year probability of a hip fracture = 3% or a 10-year probability of a major osteoporosis-related fracture = 20% based on the [place]-adapted WHO algorithm. d. Clinician judgment and/or patient preferences may indicate treatment for people with 10-year fracture probabilities above or below these levels. FOLLOW-UP: Patients with diagnosis of osteoporosis or at high risk for fracture should have regular bone mineral density tests.? Patients eligible for Medicare are allowed routine testing every 2 years.? The testing frequency can be increased to one year for patients who have rapidly progressing disease, are receiving or discontinuing medical therapy to restore bone mass, or have additional risk factors. I have reviewed this study and agree with the findings. [facility] Electronically Signed   By: Elmer Picker M.D.   On: 01/30/2022 12:21   ? ? ?Past medical hx ?Past Medical History:  ?Diagnosis Date  ? Anxiety   ? Situatual  ? Asthmatic bronchitis   ? Basal cell carcinoma of breast 1990's?  ? "left side, burned it off" (06/07/2013)  ? Breast cancer ([place]) 11/05/2021  ? COPD (chronic obstructive pulmonary disease) ([place])   ? GERD (gastroesophageal reflux disease)   ? H/O hiatal hernia   ? Hypertension   ? 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 Osteoporosis   ? Schatzki's ring   ? Shortness of breath   ? "related to asthmatic bronchitis only" (06/07/2013)  ?  ? ?Social History  ? ?Tobacco Use  ? Smoking status: Former  ?  Packs/day:  0.33  ?  Years: 13.00  ?  Pack years: 4.29  ?  Types: Cigarettes  ?  Quit date: 11/27/1968  ?  Years since quitting: 53.2  ? Smokeless tobacco: Never  ?Vaping Use  ? Vaping Use: Never used  ?Substance Use Topics  ? Alcohol use: Yes  ? Drug use: No  ? ? ?Ms.Myhre reports that she quit smoking about 53 years ago. Her smoking use included cigarettes. She has a 4.29 pack-year smoking history. She has never used smokeless tobacco. She reports current  alcohol use. She reports that she does not use drugs. ? ?Tobacco Cessation: ?Former smoker quit 1969 with a 13 pack year smoking history ? ? ?Past surgical hx, Family hx, Social hx all reviewed. ? ?Current Outpatient Medications on File Prior to Visit  ?Medication Sig  ? albuterol (PROVENTIL) (2.5 MG/3ML) 0.083% nebulizer solution Take 49m by nebulization every 6 hours as needed for wheezing or SOB  ? albuterol (VENTOLIN HFA) 108 (90 Base) MCG/ACT inhaler Inhale 2 puffs into the lungs every 6 (six) hours as needed for wheezing or shortness of breath.  ? ALPRAZolam (XANAX) 0.25 MG tablet Take 0.25 mg by mouth daily as needed.  ? Ascorbic Acid (VITAMIN C PO) Take 1 tablet by mouth daily.  ? Budeson-Glycopyrrol-Formoterol (BREZTRI AEROSPHERE) 160-9-4.8 MCG/ACT AERO INHALE 2 PUFFS INTO THE LUNGS IN THE MORNING AND AT BEDTIME.  ? Calcium Carb-Cholecalciferol (CALCIUM 600 + D PO) Take 1 tablet by mouth every other day.   ? Cholecalciferol (VITAMIN D-3 PO) Take 1 capsule by mouth daily.  ? fluticasone (FLONASE) 50 MCG/ACT nasal spray Place 1 spray into both nostrils daily as needed for allergies or rhinitis.  ? hydrochlorothiazide (HYDRODIURIL) 25 MG tablet Take 25 mg by mouth daily as needed (swelling).  ? Multiple Vitamin (MULTIVITAMIN WITH MINERALS) TABS tablet Take 1 tablet by mouth daily.  ? tamoxifen (NOLVADEX) 20 MG tablet Take 1 tablet (20 mg total) by mouth daily.  ? ?No current facility-administered medications on file prior to visit.  ?  ? ?Allergies  ?Allergen  Reactions  ? Ezetimibe Shortness Of Breath  ? Statins Other (See Comments)  ?  Increased LFT's  ? Alendronate Sodium   ?  felt sick  ? Ibandronic Acid   ?  felt sick  ? Spiriva [Tiotropium Bromide Monohydrate] Itching  ? ? ?Re

## 2022-03-07 ENCOUNTER — Other Ambulatory Visit: Payer: Self-pay

## 2022-03-07 ENCOUNTER — Inpatient Hospital Stay: Payer: Medicare Other | Attending: Hematology and Oncology | Admitting: Hematology and Oncology

## 2022-03-07 DIAGNOSIS — Z87891 Personal history of nicotine dependence: Secondary | ICD-10-CM | POA: Insufficient documentation

## 2022-03-07 DIAGNOSIS — Z806 Family history of leukemia: Secondary | ICD-10-CM | POA: Diagnosis not present

## 2022-03-07 DIAGNOSIS — J449 Chronic obstructive pulmonary disease, unspecified: Secondary | ICD-10-CM | POA: Insufficient documentation

## 2022-03-07 DIAGNOSIS — I10 Essential (primary) hypertension: Secondary | ICD-10-CM | POA: Diagnosis not present

## 2022-03-07 DIAGNOSIS — Z170 Estrogen receptor positive status [ER+]: Secondary | ICD-10-CM | POA: Insufficient documentation

## 2022-03-07 DIAGNOSIS — Z803 Family history of malignant neoplasm of breast: Secondary | ICD-10-CM | POA: Diagnosis not present

## 2022-03-07 DIAGNOSIS — Z8042 Family history of malignant neoplasm of prostate: Secondary | ICD-10-CM | POA: Insufficient documentation

## 2022-03-07 DIAGNOSIS — C50411 Malignant neoplasm of upper-outer quadrant of right female breast: Secondary | ICD-10-CM | POA: Diagnosis not present

## 2022-03-07 DIAGNOSIS — Z90710 Acquired absence of both cervix and uterus: Secondary | ICD-10-CM | POA: Diagnosis not present

## 2022-03-07 NOTE — Progress Notes (Signed)
[place] ?CONSULT NOTE ? ?Patient Care Team: ?Marda Stalker, PA-C as PCP - General (Family Medicine) ?Brand Males, MD as Consulting Physician (Pulmonary Disease) ? ?CHIEF COMPLAINTS/PURPOSE OF CONSULTATION:  ?Newly diagnosed breast cancer ? ?HISTORY OF PRESENTING ILLNESS:  ? ?Kelly Delgado 85 y.o. female is here because of recent diagnosis of right breast IDC ?I reviewed her records extensively and collaborated the history with the patient. ? ?SUMMARY OF ONCOLOGIC HISTORY: ?Oncology History  ?Malignant neoplasm of upper-outer quadrant of right breast in female, estrogen receptor positive ([place])  ?11/25/2021 Initial Diagnosis  ? Malignant neoplasm of upper-outer quadrant of right breast in female, estrogen receptor positive ([place]) ? ?  ?11/27/2021 Cancer Staging  ? Staging form: Breast, AJCC 8th Edition ?- Clinical stage from 11/27/2021: Stage IA (cT1c, cN0, cM0, G2, ER+, PR+, HER2-) - Signed by Hayden Pedro, PA-C on 11/27/2021 ?Stage prefix: Initial diagnosis ?Method of lymph node assessment: Clinical ?Histologic grading system: 3 grade system ? ?  ?12/05/2021 Surgery  ? Right breast lumpectomy showed invasive ductal carcinoma with extracellular mucin, 1.2 cm, grade 2.  Ductal carcinoma in situ.  All surgical margins negative for carcinoma, invasive carcinoma 0.2 cm from inferior margin lymph nodes not submitted.  Initial prognostic showed ER 100% positive strong staining, PR 100% positive strong staining, HER2 equivocal with IHC.  Negative with FISH ratio 1.28, Ki-67 1% ? ?  ?12/11/2021 Genetic Testing  ? Pathogenic variant detected in CHEK2 at c.1100delC ([id number]).  Variant of uncertain significance detected in MSH2 at  p.S87C (c.260C>G).  The report date is 12/11/2021.  ? ?The CustomNext-Cancer+RNAinsight panel offered by [facility] includes sequencing and rearrangement analysis for the following 47 genes:  APC, ATM, AXIN2, BARD1, BMPR1A, BRCA1, BRCA2, BRIP1, CDH1, CDK4, CDKN2A,  CHEK2, DICER1, EPCAM, GREM1, HOXB13, MEN1, MLH1, MSH2, MSH3, MSH6, MUTYH, NBN, NF1, NF2, NTHL1, PALB2, PMS2, POLD1, POLE, PTEN, RAD51C, RAD51D, RECQL, RET, SDHA, SDHAF2, SDHB, SDHC, SDHD, SMAD4, SMARCA4, STK11, TP53, TSC1, TSC2, and VHL.  RNA data is routinely analyzed for use in variant interpretation for all genes. ? ?  ? ?Interval history ? ?She is here for follow up on tamoxifen. ?She took it for 3 weeks, started off really well and then felt extremely fatigued, so she stopped it. ?She has recently had prednisone for COPD exacerbation. ?Bone density showed osteoporosis. She tried some bisphosphonates long time ago, she couldn't tolerate it. ?Rest of the pertinent 10 point ROS reviewed and negative. ? ?MEDICAL HISTORY:  ?Past Medical History:  ?Diagnosis Date  ? Anxiety   ? Situatual  ? Asthmatic bronchitis   ? Basal cell carcinoma of breast 1990's?  ? "left side, burned it off" (06/07/2013)  ? Breast cancer ([place]) 11/05/2021  ? COPD (chronic obstructive pulmonary disease) ([place])   ? GERD (gastroesophageal reflux disease)   ? H/O hiatal hernia   ? Hypertension   ? 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 Osteoporosis   ? Schatzki's ring   ? Shortness of breath   ? "related to asthmatic bronchitis only" (06/07/2013)  ? ? ?SURGICAL HISTORY: ?Past Surgical History:  ?Procedure Laterality Date  ? ABDOMINAL HYSTERECTOMY  06/28/1979  ? BREAST BIOPSY Right 11/05/2021  ? BREAST LUMPECTOMY WITH RADIOACTIVE SEED LOCALIZATION Right 12/05/2021  ? Procedure: RIGHT BREAST LUMPECTOMY WITH RADIOACTIVE SEED LOCALIZATION;  Surgeon: Donnie Mesa, MD;  Location: [facility];  Service: General;  Laterality: Right;  ? CATARACT EXTRACTION W/ INTRAOCULAR LENS  IMPLANT, BILATERAL Bilateral 06/27/1989  ? DILATION AND  CURETTAGE OF UTERUS  06/28/1979  ? "1" (06/07/2013)  ? ESOPHAGEAL DILATION  10/27/2010  ? "just once" (06/07/2013)  ? HIP PINNING,CANNULATED Right 10/09/2014  ? Procedure: CANNULATED HIP PINNING;  Surgeon:  Renette Butters, MD;  Location: [place];  Service: Orthopedics;  Laterality: Right;  ? URETHRAL DIVERTICULUM REPAIR  06/27/1969  ? ? ?SOCIAL HISTORY: ?Social History  ? ?Socioeconomic History  ? Marital status: Married  ?  Spouse name: Not on file  ? Number of children: Not on file  ? Years of education: Not on file  ? Highest education level: Not on file  ?Occupational History  ? Not on file  ?Tobacco Use  ? Smoking status: Former  ?  Packs/day: 0.33  ?  Years: 13.00  ?  Pack years: 4.29  ?  Types: Cigarettes  ?  Quit date: 11/27/1968  ?  Years since quitting: 53.3  ? Smokeless tobacco: Never  ?Vaping Use  ? Vaping Use: Never used  ?Substance and Sexual Activity  ? Alcohol use: Yes  ? Drug use: No  ? Sexual activity: Yes  ?Other Topics Concern  ? Not on file  ?Social History Narrative  ? Not on file  ? ?Social Determinants of Health  ? ?Financial Resource Strain: Not on file  ?Food Insecurity: Not on file  ?Transportation Needs: Not on file  ?Physical Activity: Not on file  ?Stress: Not on file  ?Social Connections: Not on file  ?Intimate Partner Violence: Not At Risk  ? Fear of Current or Ex-Partner: No  ? Emotionally Abused: No  ? Physically Abused: No  ? Sexually Abused: No  ? ? ?FAMILY HISTORY: ?Family History  ?Problem Relation Age of Onset  ? Cancer Mother   ? Stroke Mother   ? Hypertension Mother   ? Prostate cancer Father   ? Cancer Maternal Aunt   ?     unknown type; dx after 55  ? Leukemia Maternal Uncle   ? Breast cancer Cousin   ?     maternal female cousin; dx 66s  ? Breast cancer Cousin   ?     maternal female cousin; dx 57s  ? Breast cancer Half-Brother 46  ? ? ?ALLERGIES:  is allergic to ezetimibe, statins, alendronate sodium, ibandronic acid, and spiriva [tiotropium bromide monohydrate]. ? ?MEDICATIONS:  ?Current Outpatient Medications  ?Medication Sig Dispense Refill  ? albuterol (PROVENTIL) (2.5 MG/3ML) 0.083% nebulizer solution Take 50m by nebulization every 6 hours as needed for wheezing or SOB 75  mL 6  ? albuterol (VENTOLIN HFA) 108 (90 Base) MCG/ACT inhaler Inhale 2 puffs into the lungs every 6 (six) hours as needed for wheezing or shortness of breath. 18 each 3  ? ALPRAZolam (XANAX) 0.25 MG tablet Take 0.25 mg by mouth daily as needed.    ? Ascorbic Acid (VITAMIN C PO) Take 1 tablet by mouth daily.    ? Budeson-Glycopyrrol-Formoterol (BREZTRI AEROSPHERE) 160-9-4.8 MCG/ACT AERO INHALE 2 PUFFS INTO THE LUNGS IN THE MORNING AND AT BEDTIME. 10.7 g 5  ? Calcium Carb-Cholecalciferol (CALCIUM 600 + D PO) Take 1 tablet by mouth every other day.     ? Cholecalciferol (VITAMIN D-3 PO) Take 1 capsule by mouth daily.    ? fluticasone (FLONASE) 50 MCG/ACT nasal spray Place 1 spray into both nostrils daily as needed for allergies or rhinitis.    ? hydrochlorothiazide (HYDRODIURIL) 25 MG tablet Take 25 mg by mouth daily as needed (swelling).    ? Multiple Vitamin (MULTIVITAMIN WITH MINERALS)  TABS tablet Take 1 tablet by mouth daily.    ? predniSONE (DELTASONE) 10 MG tablet Prednisone taper; 10 mg tablets: 4 tabs x 2 days, 3 tabs x 2 days, 2 tabs x 2 days 1 tab x 2 days then stop. 20 tablet 0  ? tamoxifen (NOLVADEX) 20 MG tablet Take 1 tablet (20 mg total) by mouth daily. 30 tablet 3  ? ?No current facility-administered medications for this visit.  ? ? ? ?PHYSICAL EXAMINATION: ?ECOG PERFORMANCE STATUS: 0 - Asymptomatic ? ?Vitals:  ? 03/07/22 1334  ?BP: (!) 164/104  ?Pulse: 69  ?Resp: 17  ?Temp: 97.9 ?F (36.6 ?C)  ?SpO2: 98%  ? ? ? ?Filed Weights  ? 03/07/22 1334  ?Weight: 186 lb 12.8 oz (84.7 kg)  ? ? ?Physical exam deferred in lieu of counseling ?LABORATORY DATA:  ?I have reviewed the data as listed ?Lab Results  ?Component Value Date  ? WBC 9.6 11/28/2021  ? HGB 15.2 (H) 11/28/2021  ? HCT 46.5 (H) 11/28/2021  ? MCV 90.8 11/28/2021  ? PLT 242 11/28/2021  ? ?Lab Results  ?Component Value Date  ? NA 141 11/28/2021  ? K 3.8 11/28/2021  ? CL 105 11/28/2021  ? CO2 27 11/28/2021  ? ? ?RADIOGRAPHIC STUDIES: ?I have personally  reviewed the radiological reports and agreed with the findings in the report. ? ?ASSESSMENT AND PLAN:  ?Malignant neoplasm of upper-outer quadrant of right breast in female, estrogen receptor positive ([place]

## 2022-03-07 NOTE — Progress Notes (Deleted)
[place] ?CONSULT NOTE ? ?Patient Care Team: ?Marda Stalker, PA-C as PCP - General (Family Medicine) ?Brand Males, MD as Consulting Physician (Pulmonary Disease) ? ?CHIEF COMPLAINTS/PURPOSE OF CONSULTATION:  ?Newly diagnosed breast cancer ? ?HISTORY OF PRESENTING ILLNESS:  ? ?Kelly Delgado 85 y.o. female is here because of recent diagnosis of right breast IDC ?I reviewed her records extensively and collaborated the history with the patient. ? ?SUMMARY OF ONCOLOGIC HISTORY: ?Oncology History  ?Malignant neoplasm of upper-outer quadrant of right breast in female, estrogen receptor positive ([place])  ?11/25/2021 Initial Diagnosis  ? Malignant neoplasm of upper-outer quadrant of right breast in female, estrogen receptor positive ([place]) ? ?  ?11/27/2021 Cancer Staging  ? Staging form: Breast, AJCC 8th Edition ?- Clinical stage from 11/27/2021: Stage IA (cT1c, cN0, cM0, G2, ER+, PR+, HER2-) - Signed by Hayden Pedro, PA-C on 11/27/2021 ?Stage prefix: Initial diagnosis ?Method of lymph node assessment: Clinical ?Histologic grading system: 3 grade system ? ?  ?12/05/2021 Surgery  ? Right breast lumpectomy showed invasive ductal carcinoma with extracellular mucin, 1.2 cm, grade 2.  Ductal carcinoma in situ.  All surgical margins negative for carcinoma, invasive carcinoma 0.2 cm from inferior margin lymph nodes not submitted.  Initial prognostic showed ER 100% positive strong staining, PR 100% positive strong staining, HER2 equivocal with IHC.  Negative with FISH ratio 1.28, Ki-67 1% ? ?  ?12/11/2021 Genetic Testing  ? Pathogenic variant detected in CHEK2 at c.1100delC ([id number]).  Variant of uncertain significance detected in MSH2 at  p.S87C (c.260C>G).  The report date is 12/11/2021.  ? ?The CustomNext-Cancer+RNAinsight panel offered by [facility] includes sequencing and rearrangement analysis for the following 47 genes:  APC, ATM, AXIN2, BARD1, BMPR1A, BRCA1, BRCA2, BRIP1, CDH1, CDK4, CDKN2A,  CHEK2, DICER1, EPCAM, GREM1, HOXB13, MEN1, MLH1, MSH2, MSH3, MSH6, MUTYH, NBN, NF1, NF2, NTHL1, PALB2, PMS2, POLD1, POLE, PTEN, RAD51C, RAD51D, RECQL, RET, SDHA, SDHAF2, SDHB, SDHC, SDHD, SMAD4, SMARCA4, STK11, TP53, TSC1, TSC2, and VHL.  RNA data is routinely analyzed for use in variant interpretation for all genes. ? ?  ? ?Interval history ? ?She elected to defer adjuvant radiation since this was an option.  She is interested in trying tamoxifen.  She tells me that she has read about both the medications and she was hoping that tamoxifen also helps her bone density.  She denies any new health problems.  She is hoping to go back to the [facility] for some walking.  Rest of the pertinent 10 point ROS reviewed and negative. ? ?MEDICAL HISTORY:  ?Past Medical History:  ?Diagnosis Date  ? Anxiety   ? Situatual  ? Asthmatic bronchitis   ? Basal cell carcinoma of breast 1990's?  ? "left side, burned it off" (06/07/2013)  ? Breast cancer ([place]) 11/05/2021  ? COPD (chronic obstructive pulmonary disease) ([place])   ? GERD (gastroesophageal reflux disease)   ? H/O hiatal hernia   ? Hypertension   ? 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 Osteoporosis   ? Schatzki's ring   ? Shortness of breath   ? "related to asthmatic bronchitis only" (06/07/2013)  ? ? ?SURGICAL HISTORY: ?Past Surgical History:  ?Procedure Laterality Date  ? ABDOMINAL HYSTERECTOMY  06/28/1979  ? BREAST BIOPSY Right 11/05/2021  ? BREAST LUMPECTOMY WITH RADIOACTIVE SEED LOCALIZATION Right 12/05/2021  ? Procedure: RIGHT BREAST LUMPECTOMY WITH RADIOACTIVE SEED LOCALIZATION;  Surgeon: Donnie Mesa, MD;  Location: [place];  Service: General;  Laterality: Right;  ? CATARACT EXTRACTION  W/ INTRAOCULAR LENS  IMPLANT, BILATERAL Bilateral 06/27/1989  ? DILATION AND CURETTAGE OF UTERUS  06/28/1979  ? "1" (06/07/2013)  ? ESOPHAGEAL DILATION  10/27/2010  ? "just once" (06/07/2013)  ? HIP PINNING,CANNULATED Right 10/09/2014  ? Procedure: CANNULATED HIP PINNING;   Surgeon: Renette Butters, MD;  Location: [place];  Service: Orthopedics;  Laterality: Right;  ? URETHRAL DIVERTICULUM REPAIR  06/27/1969  ? ? ?SOCIAL HISTORY: ?Social History  ? ?Socioeconomic History  ? Marital status: Married  ?  Spouse name: Not on file  ? Number of children: Not on file  ? Years of education: Not on file  ? Highest education level: Not on file  ?Occupational History  ? Not on file  ?Tobacco Use  ? Smoking status: Former  ?  Packs/day: 0.33  ?  Years: 13.00  ?  Pack years: 4.29  ?  Types: Cigarettes  ?  Quit date: 11/27/1968  ?  Years since quitting: 53.3  ? Smokeless tobacco: Never  ?Vaping Use  ? Vaping Use: Never used  ?Substance and Sexual Activity  ? Alcohol use: Yes  ? Drug use: No  ? Sexual activity: Yes  ?Other Topics Concern  ? Not on file  ?Social History Narrative  ? Not on file  ? ?Social Determinants of Health  ? ?Financial Resource Strain: Not on file  ?Food Insecurity: Not on file  ?Transportation Needs: Not on file  ?Physical Activity: Not on file  ?Stress: Not on file  ?Social Connections: Not on file  ?Intimate Partner Violence: Not At Risk  ? Fear of Current or Ex-Partner: No  ? Emotionally Abused: No  ? Physically Abused: No  ? Sexually Abused: No  ? ? ?FAMILY HISTORY: ?Family History  ?Problem Relation Age of Onset  ? Cancer Mother   ? Stroke Mother   ? Hypertension Mother   ? Prostate cancer Father   ? Cancer Maternal Aunt   ?     unknown type; dx after 97  ? Leukemia Maternal Uncle   ? Breast cancer Cousin   ?     maternal female cousin; dx 45s  ? Breast cancer Cousin   ?     maternal female cousin; dx 76s  ? Breast cancer Half-Brother 78  ? ? ?ALLERGIES:  is allergic to ezetimibe, statins, alendronate sodium, ibandronic acid, and spiriva [tiotropium bromide monohydrate]. ? ?MEDICATIONS:  ?Current Outpatient Medications  ?Medication Sig Dispense Refill  ? albuterol (PROVENTIL) (2.5 MG/3ML) 0.083% nebulizer solution Take 67mL by nebulization every 6 hours as needed for wheezing  or SOB 75 mL 6  ? albuterol (VENTOLIN HFA) 108 (90 Base) MCG/ACT inhaler Inhale 2 puffs into the lungs every 6 (six) hours as needed for wheezing or shortness of breath. 18 each 3  ? ALPRAZolam (XANAX) 0.25 MG tablet Take 0.25 mg by mouth daily as needed.    ? Ascorbic Acid (VITAMIN C PO) Take 1 tablet by mouth daily.    ? Budeson-Glycopyrrol-Formoterol (BREZTRI AEROSPHERE) 160-9-4.8 MCG/ACT AERO INHALE 2 PUFFS INTO THE LUNGS IN THE MORNING AND AT BEDTIME. 10.7 g 5  ? Calcium Carb-Cholecalciferol (CALCIUM 600 + D PO) Take 1 tablet by mouth every other day.     ? Cholecalciferol (VITAMIN D-3 PO) Take 1 capsule by mouth daily.    ? fluticasone (FLONASE) 50 MCG/ACT nasal spray Place 1 spray into both nostrils daily as needed for allergies or rhinitis.    ? hydrochlorothiazide (HYDRODIURIL) 25 MG tablet Take 25 mg by mouth daily  as needed (swelling).    ? Multiple Vitamin (MULTIVITAMIN WITH MINERALS) TABS tablet Take 1 tablet by mouth daily.    ? predniSONE (DELTASONE) 10 MG tablet Prednisone taper; 10 mg tablets: 4 tabs x 2 days, 3 tabs x 2 days, 2 tabs x 2 days 1 tab x 2 days then stop. 20 tablet 0  ? tamoxifen (NOLVADEX) 20 MG tablet Take 1 tablet (20 mg total) by mouth daily. 30 tablet 3  ? ?No current facility-administered medications for this visit.  ? ? ? ?PHYSICAL EXAMINATION: ?ECOG PERFORMANCE STATUS: 0 - Asymptomatic ? ?Vitals:  ? 03/07/22 1334  ?BP: (!) 164/104  ?Pulse: 69  ?Resp: 17  ?Temp: 97.9 ?F (36.6 ?C)  ?SpO2: 98%  ? ? ? ?Filed Weights  ? 03/07/22 1334  ?Weight: 186 lb 12.8 oz (84.7 kg)  ? ? ?Physical exam deferred in lieu of counseling ?LABORATORY DATA:  ?I have reviewed the data as listed ?Lab Results  ?Component Value Date  ? WBC 9.6 11/28/2021  ? HGB 15.2 (H) 11/28/2021  ? HCT 46.5 (H) 11/28/2021  ? MCV 90.8 11/28/2021  ? PLT 242 11/28/2021  ? ?Lab Results  ?Component Value Date  ? NA 141 11/28/2021  ? K 3.8 11/28/2021  ? CL 105 11/28/2021  ? CO2 27 11/28/2021  ? ? ?RADIOGRAPHIC STUDIES: ?I have  personally reviewed the radiological reports and agreed with the findings in the report. ? ?ASSESSMENT AND PLAN:  ?No problem-specific Assessment & Plan notes found for this encounter. ?Total time spen

## 2022-03-07 NOTE — Assessment & Plan Note (Addendum)
This is a very pleasant 86 year old female patient with past medical history significant for COPD, hypertension referred to medical oncology for new diagnosis of right breast invasive ductal carcinoma noted on a screening mammogram.  ?During her initial visit, given her strong ER/PR positive small tumor with grade 2, we have discussed about proceeding with upfront surgery followed by discussion about adjuvant radiation and antiestrogen therapy. ?She declined adjuvant radiation.  She is now on antiestrogen with tamoxifen.  She tried tamoxifen for 3 weeks but she felt extremely fatigued hence discontinued.  She is however willing to try it again.  Today we have discussed about trying low-dose of tamoxifen 10 mg once a day and report back to [place] if she still feels extreme fatigue.  We have discussed that low-dose tamoxifen has been studied in precancerous lesions of the breast with excellent data.  We do not have great data in breast cancer patients but patient is willing to try.  She will return to clinic in August for follow-up.  She was encouraged to contact us with any new questions or concerns ?

## 2022-03-13 ENCOUNTER — Encounter (HOSPITAL_COMMUNITY): Payer: Self-pay | Admitting: Emergency Medicine

## 2022-03-13 ENCOUNTER — Emergency Department (HOSPITAL_COMMUNITY): Payer: Medicare Other

## 2022-03-13 ENCOUNTER — Emergency Department (HOSPITAL_COMMUNITY)
Admission: EM | Admit: 2022-03-13 | Discharge: 2022-03-13 | Disposition: A | Discharge status: Home / Self Care | Payer: Medicare Other | Attending: Emergency Medicine | Admitting: Emergency Medicine

## 2022-03-13 DIAGNOSIS — R030 Elevated blood-pressure reading, without diagnosis of hypertension: Secondary | ICD-10-CM | POA: Diagnosis not present

## 2022-03-13 DIAGNOSIS — S93602A Unspecified sprain of left foot, initial encounter: Secondary | ICD-10-CM | POA: Diagnosis not present

## 2022-03-13 DIAGNOSIS — W19XXXA Unspecified fall, initial encounter: Secondary | ICD-10-CM | POA: Diagnosis not present

## 2022-03-13 DIAGNOSIS — Y92000 Kitchen of unspecified non-institutional (private) residence as  the place of occurrence of the external cause: Secondary | ICD-10-CM | POA: Diagnosis not present

## 2022-03-13 DIAGNOSIS — S8001XA Contusion of right knee, initial encounter: Secondary | ICD-10-CM | POA: Insufficient documentation

## 2022-03-13 DIAGNOSIS — Z79899 Other long term (current) drug therapy: Secondary | ICD-10-CM | POA: Diagnosis not present

## 2022-03-13 DIAGNOSIS — M19072 Primary osteoarthritis, left ankle and foot: Secondary | ICD-10-CM | POA: Diagnosis not present

## 2022-03-13 DIAGNOSIS — W010XXA Fall on same level from slipping, tripping and stumbling without subsequent striking against object, initial encounter: Secondary | ICD-10-CM | POA: Diagnosis not present

## 2022-03-13 DIAGNOSIS — S8002XA Contusion of left knee, initial encounter: Secondary | ICD-10-CM | POA: Diagnosis not present

## 2022-03-13 DIAGNOSIS — Z043 Encounter for examination and observation following other accident: Secondary | ICD-10-CM | POA: Diagnosis not present

## 2022-03-13 DIAGNOSIS — R609 Edema, unspecified: Secondary | ICD-10-CM | POA: Diagnosis not present

## 2022-03-13 DIAGNOSIS — Z743 Need for continuous supervision: Secondary | ICD-10-CM | POA: Diagnosis not present

## 2022-03-13 DIAGNOSIS — S8992XA Unspecified injury of left lower leg, initial encounter: Secondary | ICD-10-CM | POA: Diagnosis present

## 2022-03-13 MED ORDER — ACETAMINOPHEN 500 MG PO TABS
1000.0000 mg | ORAL_TABLET | Freq: Once | ORAL | Status: AC
Start: 2022-03-13 — End: 2022-03-13
  Administered 2022-03-13: 1000 mg via ORAL
  Filled 2022-03-13: qty 2

## 2022-03-13 NOTE — ED Notes (Signed)
Pt drank water and was assisted to the restroom via wheelchair with stand-by assist.

## 2022-03-13 NOTE — ED Provider Triage Note (Signed)
Emergency Medicine Provider Triage Evaluation Note  Kelly Delgado , a 86 y.o. female  was evaluated in triage.  Pt complains of bilateral knee pain and left medial foot pain after mechanical fall.  She feels that there is something that caught on her foot that caused her to fall.  States that she landed on both of her knees, denies any head injury or loss of consciousness.  States that she has not ambulated since the incident due to pain in her foot and knees.  No headache.  Review of Systems  Positive: Bilateral knee pain, left foot pain Negative: Loss of consciousness  Physical Exam  There were no vitals taken for this visit. Gen:   Awake, no distress   Resp:  Normal effort  MSK:   Moves extremities without difficulty  Other:  Tenderness to palpation of the left medial foot without overlying skin changes.  Tenderness palpation of bilateral knees without overlying skin changes  Medical Decision Making  Medically screening exam initiated at 7:04 PM.  Appropriate orders placed.  Kelly Delgado was informed that the remainder of the evaluation will be completed by another provider, this initial triage assessment does not replace that evaluation, and the importance of remaining in the ED until their evaluation is complete.  X-rays ordered   Delia Heady, PA-C 03/13/22 1906

## 2022-03-13 NOTE — ED Triage Notes (Signed)
Patient BIB [facility] from home after a mechanical fall, complains of bilateral knee pain after falling onto her knees. Also complains of left foot pain. Denies head injury, denies LOC, no anticoagulation.   BP 110/70 HR 70 97% on room air CBG 127

## 2022-03-13 NOTE — Discharge Instructions (Addendum)
It was our pleasure to provide your ER care today - we hope that you feel better.  Icepack to sore area. Take acetaminophen or ibuprofen as need.   Follow up with primary care doctor in one week if symptoms fail to improve/resolve. Also follow up with your doctor regarding your blood pressure that is high tonight.   Return to ER if worse, new symptoms, fevers, new/severe pain, weak/faint, or other concern.

## 2022-03-13 NOTE — ED Provider Notes (Signed)
[facility] EMERGENCY DEPARTMENT Provider Note   CSN: [id number] Arrival date & time: 03/13/22  1846     History  Chief Complaint  Patient presents with   Fall    Kelly Delgado is a 86 y.o. female.  Patient presents s/p fall. States was about to make food in kitchen, when fell forward onto knees. C/o bilateral knee contusion/pain, and left foot pain. Denies any faintness, lightheadedness, or dizziness prior to fall. States felt fine, at baseline, all day including just prior to fall. Denies loc. No head injury or headache. No neck or back pain. No chest pain or sob. No abd pain or nv. Skin intact. No anticoag use.   The history is provided by the patient, medical records and the EMS personnel.  Fall Pertinent negatives include no chest pain, no abdominal pain, no headaches and no shortness of breath.      Home Medications Prior to Admission medications   Medication Sig Start Date End Date Taking? Authorizing Provider  albuterol (PROVENTIL) (2.5 MG/3ML) 0.083% nebulizer solution Take 36m by nebulization every 6 hours as needed for wheezing or SOB 05/27/21   RBrand Males MD  albuterol (VENTOLIN HFA) 108 (90 Base) MCG/ACT inhaler Inhale 2 puffs into the lungs every 6 (six) hours as needed for wheezing or shortness of breath. 11/27/21   RBrand Males MD  ALPRAZolam (Duanne Moron 0.25 MG tablet Take 0.25 mg by mouth daily as needed. 11/26/21   [provider]  Ascorbic Acid (VITAMIN C PO) Take 1 tablet by mouth daily.    [provider]  Budeson-Glycopyrrol-Formoterol (BREZTRI AEROSPHERE) 160-9-4.8 MCG/ACT AERO INHALE 2 PUFFS INTO THE LUNGS IN THE MORNING AND AT BEDTIME. 12/03/21   RBrand Males MD  Calcium Carb-Cholecalciferol (CALCIUM 600 + D PO) Take 1 tablet by mouth every other day.     [provider]  Cholecalciferol (VITAMIN D-3 PO) Take 1 capsule by mouth daily.    [provider]  fluticasone (FLONASE) 50 MCG/ACT nasal  spray Place 1 spray into both nostrils daily as needed for allergies or rhinitis.    [provider]  hydrochlorothiazide (HYDRODIURIL) 25 MG tablet Take 25 mg by mouth daily as needed (swelling).    [provider]  Multiple Vitamin (MULTIVITAMIN WITH MINERALS) TABS tablet Take 1 tablet by mouth daily.    [provider]  predniSONE (DELTASONE) 10 MG tablet Prednisone taper; 10 mg tablets: 4 tabs x 2 days, 3 tabs x 2 days, 2 tabs x 2 days 1 tab x 2 days then stop. 02/25/22   GMagdalen Spatz NP  tamoxifen (NOLVADEX) 20 MG tablet Take 1 tablet (20 mg total) by mouth daily. 01/27/22   IBenay Pike MD      Allergies    Ezetimibe, Statins, Alendronate sodium, Ibandronic acid, and Spiriva [tiotropium bromide monohydrate]    Review of Systems   Review of Systems  Constitutional:  Negative for fever.  Respiratory:  Negative for shortness of breath.   Cardiovascular:  Negative for chest pain.  Gastrointestinal:  Negative for abdominal pain and vomiting.  Genitourinary:  Negative for flank pain.  Musculoskeletal:  Negative for back pain and neck pain.  Skin:  Negative for wound.  Neurological:  Negative for weakness, numbness and headaches.  Hematological:  Does not bruise/bleed easily.  Psychiatric/Behavioral:  Negative for confusion.    Physical Exam Updated Vital Signs BP (!) 168/89   Pulse 72   Temp 98.1 F (36.7 C) (Oral)   Resp 18  SpO2 96%  Physical Exam Vitals and nursing note reviewed.  Constitutional:      Appearance: Normal appearance. She is well-developed.  HENT:     Head: Atraumatic.     Nose: Nose normal.     Mouth/Throat:     Mouth: Mucous membranes are moist.  Eyes:     General: No scleral icterus.    Conjunctiva/sclera: Conjunctivae normal.     Pupils: Pupils are equal, round, and reactive to light.  Neck:     Trachea: No tracheal deviation.  Cardiovascular:     Rate and Rhythm: Normal rate and regular rhythm.     Pulses: Normal  pulses.     Heart sounds: Normal heart sounds. No murmur heard.   No friction rub. No gallop.  Pulmonary:     Effort: Pulmonary effort is normal. No respiratory distress.     Breath sounds: Normal breath sounds.  Abdominal:     General: There is no distension.     Tenderness: There is no abdominal tenderness.  Genitourinary:    Comments: No cva tenderness.  Musculoskeletal:     Cervical back: Normal range of motion and neck supple. No rigidity. No muscular tenderness.     Comments: Mild sts and tenderness anterior/inf to bil knees. Knees grossly stable. Distal pulses palp bil. No ankle/malleolar tenderness. No focal bony tenderness on bil foot exam. CTLS spine, non tender, aligned, no step off.   Skin:    General: Skin is warm and dry.     Findings: No rash.  Neurological:     Mental Status: She is alert.     Comments: Alert, speech normal. GCS 15. Motor/sens grossly intact bil.   Psychiatric:        Mood and Affect: Mood normal.    ED Results / Procedures / Treatments   Labs (all labs ordered are listed, but only abnormal results are displayed) Labs Reviewed - No data to display  EKG None  Radiology DG Knee Complete 4 Views Left  Result Date: 03/13/2022 CLINICAL DATA:  [place], bilateral knee pain and bruising, swelling EXAM: LEFT KNEE - COMPLETE 4+ VIEW COMPARISON:  12/19/2005 FINDINGS: Frontal, bilateral oblique, lateral views of the left knee are obtained. No acute fracture, subluxation, or dislocation. Mild medial and lateral compartmental joint space narrowing. No joint effusion. Mild subcutaneous fat stranding within the infrapatellar soft tissues which may reflect direct trauma. IMPRESSION: 1. Anterior infrapatellar soft tissue swelling. 2. No acute fracture. 3. Mild osteoarthritis. Electronically Signed   By: Randa Ngo M.D.   On: 03/13/2022 19:52   DG Knee Complete 4 Views Right  Result Date: 03/13/2022 CLINICAL DATA:  [place], knee pain and bruising EXAM: RIGHT KNEE -  COMPLETE 4+ VIEW COMPARISON:  None Available. FINDINGS: Frontal, bilateral oblique, lateral views of the right knee are obtained. No fracture, subluxation, or dislocation. There is mild 3 compartmental osteoarthritis greatest in the medial and lateral compartments. No joint effusion. Soft tissues are unremarkable. IMPRESSION: 1. Mild 3 compartmental osteoarthritis. 2. No acute bony abnormality. Electronically Signed   By: Randa Ngo M.D.   On: 03/13/2022 19:53   DG Foot Complete Left  Result Date: 03/13/2022 CLINICAL DATA:  [place] EXAM: LEFT FOOT - COMPLETE 3+ VIEW COMPARISON:  None Available. FINDINGS: Frontal, oblique, and lateral views of the left foot are obtained. The bones are diffusely osteopenic. No acute displaced fracture, subluxation, or dislocation. Mild osteoarthritis at the talonavicular joint. Small superior and inferior calcaneal spurs. Soft tissues are unremarkable. IMPRESSION: 1.  No acute fracture. 2. Mild degenerative change. 3. Osteopenia. Electronically Signed   By: Randa Ngo M.D.   On: 03/13/2022 19:54    Procedures Procedures    Medications Ordered in ED Medications  acetaminophen (TYLENOL) tablet 1,000 mg (has no administration in time range)    ED Course/ Medical Decision Making/ A&P                           Medical Decision Making Problems Addressed: Contusion of left knee, initial encounter: acute illness or injury Contusion of right knee, initial encounter: acute illness or injury Elevated blood pressure reading: acute illness or injury Fall from slip, trip, or stumble, initial encounter: acute illness or injury that poses a threat to life or bodily functions Sprain of left foot, initial encounter: acute illness or injury  Amount and/or Complexity of Data Reviewed Independent Historian: spouse and EMS    Details: hx External Data Reviewed: notes. Radiology: ordered and independent interpretation performed. Decision-making details documented in ED  Course.  Risk OTC drugs.   Imaging ordered.   Reviewed nursing notes and prior charts for additional history.  Additional hx from spouse/ems.   Xrays reviewed/interpreted by me - no fx.  Acetaminophen po. Icepack to sore area.   Po fluids, food, ambulate in hall.  Pt appears stable for d/c.   Return precautions provided.          Final Clinical Impression(s) / ED Diagnoses Final diagnoses:  None    Rx / DC Orders ED Discharge Orders     None         Lajean Saver, MD 03/13/22 2215

## 2022-03-17 DIAGNOSIS — M25561 Pain in right knee: Secondary | ICD-10-CM | POA: Diagnosis not present

## 2022-03-17 DIAGNOSIS — M25572 Pain in left ankle and joints of left foot: Secondary | ICD-10-CM | POA: Diagnosis not present

## 2022-03-19 DIAGNOSIS — J449 Chronic obstructive pulmonary disease, unspecified: Secondary | ICD-10-CM | POA: Diagnosis not present

## 2022-03-19 DIAGNOSIS — I10 Essential (primary) hypertension: Secondary | ICD-10-CM | POA: Diagnosis not present

## 2022-03-19 DIAGNOSIS — E785 Hyperlipidemia, unspecified: Secondary | ICD-10-CM | POA: Diagnosis not present

## 2022-03-19 DIAGNOSIS — M810 Age-related osteoporosis without current pathological fracture: Secondary | ICD-10-CM | POA: Diagnosis not present

## 2022-03-20 DIAGNOSIS — R262 Difficulty in walking, not elsewhere classified: Secondary | ICD-10-CM | POA: Diagnosis not present

## 2022-03-20 DIAGNOSIS — S8002XD Contusion of left knee, subsequent encounter: Secondary | ICD-10-CM | POA: Diagnosis not present

## 2022-03-20 DIAGNOSIS — S93602D Unspecified sprain of left foot, subsequent encounter: Secondary | ICD-10-CM | POA: Diagnosis not present

## 2022-03-20 DIAGNOSIS — S8001XD Contusion of right knee, subsequent encounter: Secondary | ICD-10-CM | POA: Diagnosis not present

## 2022-03-20 DIAGNOSIS — M6281 Muscle weakness (generalized): Secondary | ICD-10-CM | POA: Diagnosis not present

## 2022-03-26 DIAGNOSIS — M6281 Muscle weakness (generalized): Secondary | ICD-10-CM | POA: Diagnosis not present

## 2022-03-26 DIAGNOSIS — S93602D Unspecified sprain of left foot, subsequent encounter: Secondary | ICD-10-CM | POA: Diagnosis not present

## 2022-03-26 DIAGNOSIS — S8002XD Contusion of left knee, subsequent encounter: Secondary | ICD-10-CM | POA: Diagnosis not present

## 2022-03-26 DIAGNOSIS — S8001XD Contusion of right knee, subsequent encounter: Secondary | ICD-10-CM | POA: Diagnosis not present

## 2022-03-26 DIAGNOSIS — R262 Difficulty in walking, not elsewhere classified: Secondary | ICD-10-CM | POA: Diagnosis not present

## 2022-04-01 DIAGNOSIS — S93602D Unspecified sprain of left foot, subsequent encounter: Secondary | ICD-10-CM | POA: Diagnosis not present

## 2022-04-01 DIAGNOSIS — S8001XD Contusion of right knee, subsequent encounter: Secondary | ICD-10-CM | POA: Diagnosis not present

## 2022-04-01 DIAGNOSIS — S8002XD Contusion of left knee, subsequent encounter: Secondary | ICD-10-CM | POA: Diagnosis not present

## 2022-04-01 DIAGNOSIS — R262 Difficulty in walking, not elsewhere classified: Secondary | ICD-10-CM | POA: Diagnosis not present

## 2022-04-01 DIAGNOSIS — M6281 Muscle weakness (generalized): Secondary | ICD-10-CM | POA: Diagnosis not present

## 2022-04-03 DIAGNOSIS — S8002XD Contusion of left knee, subsequent encounter: Secondary | ICD-10-CM | POA: Diagnosis not present

## 2022-04-03 DIAGNOSIS — S93602D Unspecified sprain of left foot, subsequent encounter: Secondary | ICD-10-CM | POA: Diagnosis not present

## 2022-04-03 DIAGNOSIS — S8001XD Contusion of right knee, subsequent encounter: Secondary | ICD-10-CM | POA: Diagnosis not present

## 2022-04-03 DIAGNOSIS — M6281 Muscle weakness (generalized): Secondary | ICD-10-CM | POA: Diagnosis not present

## 2022-04-03 DIAGNOSIS — R262 Difficulty in walking, not elsewhere classified: Secondary | ICD-10-CM | POA: Diagnosis not present

## 2022-04-07 ENCOUNTER — Encounter: Payer: Self-pay | Admitting: Hematology and Oncology

## 2022-04-07 DIAGNOSIS — S93602D Unspecified sprain of left foot, subsequent encounter: Secondary | ICD-10-CM | POA: Diagnosis not present

## 2022-04-07 DIAGNOSIS — R262 Difficulty in walking, not elsewhere classified: Secondary | ICD-10-CM | POA: Diagnosis not present

## 2022-04-07 DIAGNOSIS — M6281 Muscle weakness (generalized): Secondary | ICD-10-CM | POA: Diagnosis not present

## 2022-04-07 DIAGNOSIS — S8002XD Contusion of left knee, subsequent encounter: Secondary | ICD-10-CM | POA: Diagnosis not present

## 2022-04-07 DIAGNOSIS — S8001XD Contusion of right knee, subsequent encounter: Secondary | ICD-10-CM | POA: Diagnosis not present

## 2022-04-08 ENCOUNTER — Telehealth: Payer: Self-pay | Admitting: *Deleted

## 2022-04-08 NOTE — Telephone Encounter (Signed)
Contacted patient at request of Wilber Bihari, DNP. She advised patient be seen for evaluation following Mychart message c/o rash after beginning Tamoxifen.  Patient cannot come to office today due to another medical appt. Scheduled for tomorrow 6/14 with [place]. Patient verbalized understanding of appt time.

## 2022-04-09 ENCOUNTER — Inpatient Hospital Stay: Payer: Medicare Other | Attending: Hematology and Oncology | Admitting: Physician Assistant

## 2022-04-09 ENCOUNTER — Other Ambulatory Visit: Payer: Self-pay

## 2022-04-09 VITALS — BP 145/85 | HR 66 | Temp 98.0°F | Resp 18 | Wt 187.8 lb

## 2022-04-09 DIAGNOSIS — Z8042 Family history of malignant neoplasm of prostate: Secondary | ICD-10-CM | POA: Insufficient documentation

## 2022-04-09 DIAGNOSIS — M1990 Unspecified osteoarthritis, unspecified site: Secondary | ICD-10-CM | POA: Insufficient documentation

## 2022-04-09 DIAGNOSIS — Z87891 Personal history of nicotine dependence: Secondary | ICD-10-CM | POA: Diagnosis not present

## 2022-04-09 DIAGNOSIS — Z90710 Acquired absence of both cervix and uterus: Secondary | ICD-10-CM | POA: Diagnosis not present

## 2022-04-09 DIAGNOSIS — Z803 Family history of malignant neoplasm of breast: Secondary | ICD-10-CM | POA: Diagnosis not present

## 2022-04-09 DIAGNOSIS — Z806 Family history of leukemia: Secondary | ICD-10-CM | POA: Insufficient documentation

## 2022-04-09 DIAGNOSIS — R21 Rash and other nonspecific skin eruption: Secondary | ICD-10-CM | POA: Diagnosis not present

## 2022-04-09 DIAGNOSIS — I10 Essential (primary) hypertension: Secondary | ICD-10-CM | POA: Insufficient documentation

## 2022-04-09 DIAGNOSIS — C50411 Malignant neoplasm of upper-outer quadrant of right female breast: Secondary | ICD-10-CM | POA: Diagnosis not present

## 2022-04-09 DIAGNOSIS — C50412 Malignant neoplasm of upper-outer quadrant of left female breast: Secondary | ICD-10-CM

## 2022-04-09 DIAGNOSIS — Z170 Estrogen receptor positive status [ER+]: Secondary | ICD-10-CM | POA: Diagnosis not present

## 2022-04-09 MED ORDER — KETOCONAZOLE 2 % EX SHAM: 1.0000 "application " | MEDICATED_SHAMPOO | CUTANEOUS | 0 refills | Status: DC

## 2022-04-09 NOTE — Progress Notes (Signed)
Symptom Management Consult note [place]    Patient Care Team: Marda Stalker, PA-C as PCP - General (Family Medicine) Brand Males, MD as Consulting Physician (Pulmonary Disease)    Name of the patient: Kelly Delgado  [id number]  01-Jul-1936   Date of visit: 04/09/2022    Chief complaint/ Reason for visit- rash  Oncology History  Malignant neoplasm of upper-outer quadrant of right breast in female, estrogen receptor positive ([place])  11/25/2021 Initial Diagnosis   Malignant neoplasm of upper-outer quadrant of right breast in female, estrogen receptor positive ([place])   11/27/2021 Cancer Staging   Staging form: Breast, AJCC 8th Edition - Clinical stage from 11/27/2021: Stage IA (cT1c, cN0, cM0, G2, ER+, PR+, HER2-) - Signed by Hayden Pedro, PA-C on 11/27/2021 Stage prefix: Initial diagnosis Method of lymph node assessment: Clinical Histologic grading system: 3 grade system   12/05/2021 Surgery   Right breast lumpectomy showed invasive ductal carcinoma with extracellular mucin, 1.2 cm, grade 2.  Ductal carcinoma in situ.  All surgical margins negative for carcinoma, invasive carcinoma 0.2 cm from inferior margin lymph nodes not submitted.  Initial prognostic showed ER 100% positive strong staining, PR 100% positive strong staining, HER2 equivocal with IHC.  Negative with FISH ratio 1.28, Ki-67 1%    12/11/2021 Genetic Testing   Pathogenic variant detected in CHEK2 at c.1100delC ([id number]).  Variant of uncertain significance detected in MSH2 at  p.S87C (c.260C>G).  The report date is 12/11/2021.   The CustomNext-Cancer+RNAinsight panel offered by [facility] includes sequencing and rearrangement analysis for the following 47 genes:  APC, ATM, AXIN2, BARD1, BMPR1A, BRCA1, BRCA2, BRIP1, CDH1, CDK4, CDKN2A, CHEK2, DICER1, EPCAM, GREM1, HOXB13, MEN1, MLH1, MSH2, MSH3, MSH6, MUTYH, NBN, NF1, NF2, NTHL1, PALB2, PMS2, POLD1, POLE, PTEN, RAD51C, RAD51D, RECQL,  RET, SDHA, SDHAF2, SDHB, SDHC, SDHD, SMAD4, SMARCA4, STK11, TP53, TSC1, TSC2, and VHL.  RNA data is routinely analyzed for use in variant interpretation for all genes.      Current Therapy: Tamoxifen  Interval history- Kelly Delgado is a 86 y.o. with oncologic history as above presenting to [facility] today with chief complaint of rash x1 week.  Patient states she was started on tamoxifen in April and had to discontinue it after 3 weeks because she was experiencing severe fatigue.  She saw oncologist Dr. Chryl Heck last month after stopping the tamoxifen and plan was to restart it at a low dose.  Patient states she just started it back x2 weeks ago and again does not feel like she is tolerating it well.  She reports having a rash on her right upper back that started x1 week ago.  She states she had the same rash while on tamoxifen back in April.  Rash resolved when she stopped the tamoxifen.  She states that the rash itches and she noticed a few lesions on her scalp.  She has an antifungal cream that she used for the rash in the past however has not yet tried at this time.  She states the itching is intense and she rates it 7 out of 10 in severity.  Patient also tells me that she feels very emotional while on the tamoxifen.  She frequently is crying which is different from her baseline.  She denies any suicidal or homicidal ideations.  She also thinks her fatigue is starting to slowly worsen.  No over-the-counter medications tried for symptoms prior to arrival.  Patient denies any fever, chills, headache, neck pain, numbness, tingling or  weakness.      ROS  All other systems are reviewed and are negative for acute change except as noted in the HPI.    Allergies  Allergen Reactions   Ezetimibe Shortness Of Breath   Statins Other (See Comments)    Increased LFT's   Alendronate Sodium     felt sick   Ibandronic Acid     felt sick   Spiriva [Tiotropium Bromide Monohydrate] Itching     Past Medical  History:  Diagnosis Date   Anxiety    Situatual   Asthmatic bronchitis    Basal cell carcinoma of breast 1990's?   "left side, burned it off" (06/07/2013)   Breast cancer ([place]) 11/05/2021   COPD (chronic obstructive pulmonary disease) ([place])    GERD (gastroesophageal reflux disease)    H/O hiatal hernia    Hypertension    Monoallelic mutation of CHEK2 gene in female patient 12/12/2021   Osteoporosis    Schatzki's ring    Shortness of breath    "related to asthmatic bronchitis only" (06/07/2013)     Past Surgical History:  Procedure Laterality Date   ABDOMINAL HYSTERECTOMY  06/28/1979   BREAST BIOPSY Right 11/05/2021   BREAST LUMPECTOMY WITH RADIOACTIVE SEED LOCALIZATION Right 12/05/2021   Procedure: RIGHT BREAST LUMPECTOMY WITH RADIOACTIVE SEED LOCALIZATION;  Surgeon: Donnie Mesa, MD;  Location: [place];  Service: General;  Laterality: Right;   CATARACT EXTRACTION W/ INTRAOCULAR LENS  IMPLANT, BILATERAL Bilateral 06/27/1989   DILATION AND CURETTAGE OF UTERUS  06/28/1979   "1" (06/07/2013)   ESOPHAGEAL DILATION  10/27/2010   "just once" (06/07/2013)   HIP PINNING,CANNULATED Right 10/09/2014   Procedure: CANNULATED HIP PINNING;  Surgeon: Renette Butters, MD;  Location: [place];  Service: Orthopedics;  Laterality: Right;   URETHRAL DIVERTICULUM REPAIR  06/27/1969    Social History   Socioeconomic History   Marital status: Married    Spouse name: Not on file   Number of children: Not on file   Years of education: Not on file   Highest education level: Not on file  Occupational History   Not on file  Tobacco Use   Smoking status: Former    Packs/day: 0.33    Years: 13.00    Total pack years: 4.29    Types: Cigarettes    Quit date: 11/27/1968    Years since quitting: 53.4   Smokeless tobacco: Never  Vaping Use   Vaping Use: Never used  Substance and Sexual Activity   Alcohol use: Yes   Drug use: No   Sexual activity: Yes  Other Topics Concern   Not on file  Social  History Narrative   Not on file   Social Determinants of Health   Financial Resource Strain: Not on file  Food Insecurity: Not on file  Transportation Needs: Not on file  Physical Activity: Not on file  Stress: Not on file  Social Connections: Not on file  Intimate Partner Violence: Not At Risk (11/28/2021)   Humiliation, Afraid, Rape, and Kick questionnaire    Fear of Current or Ex-Partner: No    Emotionally Abused: No    Physically Abused: No    Sexually Abused: No    Family History  Problem Relation Age of Onset   Cancer Mother    Stroke Mother    Hypertension Mother    Prostate cancer Father    Cancer Maternal Aunt        unknown type; dx after 50   Leukemia Maternal Uncle  Breast cancer Cousin        maternal female cousin; dx 67s   Breast cancer Cousin        maternal female cousin; dx 58s   Breast cancer Half-Brother 59     Current Outpatient Medications:    [START ON 04/10/2022] ketoconazole (NIZORAL) 2 % shampoo, Apply 1 application  topically 2 (two) times a week., Disp: 120 mL, Rfl: 0   albuterol (PROVENTIL) (2.5 MG/3ML) 0.083% nebulizer solution, Take 11m by nebulization every 6 hours as needed for wheezing or SOB, Disp: 75 mL, Rfl: 6   albuterol (VENTOLIN HFA) 108 (90 Base) MCG/ACT inhaler, Inhale 2 puffs into the lungs every 6 (six) hours as needed for wheezing or shortness of breath., Disp: 18 each, Rfl: 3   ALPRAZolam (XANAX) 0.25 MG tablet, Take 0.25 mg by mouth daily as needed., Disp: , Rfl:    Ascorbic Acid (VITAMIN C PO), Take 1 tablet by mouth daily., Disp: , Rfl:    Budeson-Glycopyrrol-Formoterol (BREZTRI AEROSPHERE) 160-9-4.8 MCG/ACT AERO, INHALE 2 PUFFS INTO THE LUNGS IN THE MORNING AND AT BEDTIME., Disp: 10.7 g, Rfl: 5   Calcium Carb-Cholecalciferol (CALCIUM 600 + D PO), Take 1 tablet by mouth every other day. , Disp: , Rfl:    Cholecalciferol (VITAMIN D-3 PO), Take 1 capsule by mouth daily., Disp: , Rfl:    fluticasone (FLONASE) 50 MCG/ACT nasal  spray, Place 1 spray into both nostrils daily as needed for allergies or rhinitis., Disp: , Rfl:    hydrochlorothiazide (HYDRODIURIL) 25 MG tablet, Take 25 mg by mouth daily as needed (swelling)., Disp: , Rfl:    Multiple Vitamin (MULTIVITAMIN WITH MINERALS) TABS tablet, Take 1 tablet by mouth daily., Disp: , Rfl:    predniSONE (DELTASONE) 10 MG tablet, Prednisone taper; 10 mg tablets: 4 tabs x 2 days, 3 tabs x 2 days, 2 tabs x 2 days 1 tab x 2 days then stop., Disp: 20 tablet, Rfl: 0   tamoxifen (NOLVADEX) 20 MG tablet, Take 1 tablet (20 mg total) by mouth daily., Disp: 30 tablet, Rfl: 3  PHYSICAL EXAM: ECOG FS:1 - Symptomatic but completely ambulatory    Vitals:   04/09/22 1147 04/09/22 1159 04/09/22 1216  BP:  (!) 185/111 (!) 145/85  Pulse:  66   Resp:  18   Temp:  98 F (36.7 C)   TempSrc:  Oral   SpO2:  98%   Weight: 187 lb 12.8 oz (85.2 kg)     Physical Exam Vitals and nursing note reviewed.  Constitutional:      Appearance: She is well-developed. She is not ill-appearing or toxic-appearing.  HENT:     Head: Normocephalic and atraumatic.     Nose: Nose normal.  Eyes:     General: No scleral icterus.       Right eye: No discharge.        Left eye: No discharge.     Conjunctiva/sclera: Conjunctivae normal.  Neck:     Vascular: No JVD.  Cardiovascular:     Rate and Rhythm: Normal rate and regular rhythm.     Pulses: Normal pulses.     Heart sounds: Normal heart sounds.  Pulmonary:     Effort: Pulmonary effort is normal.     Breath sounds: Normal breath sounds.  Abdominal:     General: There is no distension.  Musculoskeletal:        General: Normal range of motion.     Cervical back: Normal range of motion.  Skin:  General: Skin is warm and dry.     Findings: Rash present.     Comments: Fungal rash on right upper back with a few lesions on occipital scalp. No open lesions. No crusting.   Neurological:     Mental Status: She is oriented to person, place, and  time.     GCS: GCS eye subscore is 4. GCS verbal subscore is 5. GCS motor subscore is 6.     Comments: Fluent speech, no facial droop.  Psychiatric:        Behavior: Behavior normal.        LABORATORY DATA: I have reviewed the data as listed    Latest Ref Rng & Units 11/28/2021    2:52 PM 08/06/2021    9:29 AM 07/17/2017   10:13 PM  CBC  WBC 4.0 - 10.5 K/uL 9.6  6.7  9.0   Hemoglobin 12.0 - 15.0 g/dL 15.2  13.6  14.4   Hematocrit 36.0 - 46.0 % 46.5  41.1  42.9   Platelets 150 - 400 K/uL 242  166.0  197         Latest Ref Rng & Units 11/28/2021    2:52 PM 07/17/2017   10:13 PM 11/24/2016    4:37 PM  CMP  Glucose 70 - 99 mg/dL 92  101    BUN 8 - 23 mg/dL 18  20    Creatinine 0.44 - 1.00 mg/dL 0.87  0.92  0.67   Sodium 135 - 145 mmol/L 141  137    Potassium 3.5 - 5.1 mmol/L 3.8  4.1    Chloride 98 - 111 mmol/L 105  104    CO2 22 - 32 mmol/L 27  24    Calcium 8.9 - 10.3 mg/dL 9.8  9.3    Total Protein 6.5 - 8.1 g/dL 7.2     Total Bilirubin 0.3 - 1.2 mg/dL 0.7     Alkaline Phos 38 - 126 U/L 176     AST 15 - 41 U/L 30     ALT 0 - 44 U/L 27          RADIOGRAPHIC STUDIES: I have personally reviewed the radiological images as listed and agreed with the findings in the report. No images are attached to the encounter. DG Foot Complete Left  Result Date: 03/13/2022 CLINICAL DATA:  [place] EXAM: LEFT FOOT - COMPLETE 3+ VIEW COMPARISON:  None Available. FINDINGS: Frontal, oblique, and lateral views of the left foot are obtained. The bones are diffusely osteopenic. No acute displaced fracture, subluxation, or dislocation. Mild osteoarthritis at the talonavicular joint. Small superior and inferior calcaneal spurs. Soft tissues are unremarkable. IMPRESSION: 1. No acute fracture. 2. Mild degenerative change. 3. Osteopenia. Electronically Signed   By: Randa Ngo M.D.   On: 03/13/2022 19:54   DG Knee Complete 4 Views Right  Result Date: 03/13/2022 CLINICAL DATA:  [place], knee pain and  bruising EXAM: RIGHT KNEE - COMPLETE 4+ VIEW COMPARISON:  None Available. FINDINGS: Frontal, bilateral oblique, lateral views of the right knee are obtained. No fracture, subluxation, or dislocation. There is mild 3 compartmental osteoarthritis greatest in the medial and lateral compartments. No joint effusion. Soft tissues are unremarkable. IMPRESSION: 1. Mild 3 compartmental osteoarthritis. 2. No acute bony abnormality. Electronically Signed   By: Randa Ngo M.D.   On: 03/13/2022 19:53   DG Knee Complete 4 Views Left  Result Date: 03/13/2022 CLINICAL DATA:  [place], bilateral knee pain and bruising, swelling EXAM: LEFT KNEE -  COMPLETE 4+ VIEW COMPARISON:  12/19/2005 FINDINGS: Frontal, bilateral oblique, lateral views of the left knee are obtained. No acute fracture, subluxation, or dislocation. Mild medial and lateral compartmental joint space narrowing. No joint effusion. Mild subcutaneous fat stranding within the infrapatellar soft tissues which may reflect direct trauma. IMPRESSION: 1. Anterior infrapatellar soft tissue swelling. 2. No acute fracture. 3. Mild osteoarthritis. Electronically Signed   By: Randa Ngo M.D.   On: 03/13/2022 19:52     ASSESSMENT & PLAN: Patient is a 86 y.o. female  with oncologic history of malignant neoplasm of upper-outer quadrant or right breast, ER + followed by Dr. Chryl Heck.  I have viewed most recent oncology note and lab work.    #)Essential hypertension- Patient hypertensive 185/11 on clinic arrival. Manual was rechecked and she was normotensive 145/85. She is asymptomatic and BP is monitored by pcp. Encouraged her to follow up with pcp for recheck.  #)Rash- Fungal appearing. She has Clotrimazole ointment that helped in the past. Advised her to restart ointment and I prescribed ketoconazole shampoo for scalp involvement. No purulent drainage or bleeding. No signs of infection.   #)Right breast cancer, ER+ - Patient is adamant about discontinuing the Tamoxifen  as she does not feel she is tolerating it well. She feels strongly about preserving the quality of her life. I discussed the risks of discontinuing Tamoxifen and patient reports understanding. Oncologist is out of the office today, I will send an inbasket message to let her know of patient's decision. Patient's next appointment is scheduled to see oncologist in early August.  Visit Diagnosis: 1. Rash   2. Malignant neoplasm of upper-outer quadrant of left breast in female, estrogen receptor positive ([place])   3. Essential hypertension      No orders of the defined types were placed in this encounter.   All questions were answered. The patient knows to call the clinic with any problems, questions or concerns. No barriers to learning was detected.  I have spent a total of 30 minutes minutes of face-to-face and non-face-to-face time, preparing to see the patient, obtaining and/or reviewing separately obtained history, performing a medically appropriate examination, counseling and educating the patient,  documenting clinical information in the electronic health record, and care coordination.     Thank you for allowing me to participate in the care of this patient.    Barrie Folk, PA-C Department of Hematology/Oncology [facility] at [facility] Phone: [phone]  [phone]    04/09/2022 2:59 PM

## 2022-04-11 DIAGNOSIS — M6281 Muscle weakness (generalized): Secondary | ICD-10-CM | POA: Diagnosis not present

## 2022-04-11 DIAGNOSIS — S93602D Unspecified sprain of left foot, subsequent encounter: Secondary | ICD-10-CM | POA: Diagnosis not present

## 2022-04-11 DIAGNOSIS — R262 Difficulty in walking, not elsewhere classified: Secondary | ICD-10-CM | POA: Diagnosis not present

## 2022-04-11 DIAGNOSIS — S8002XD Contusion of left knee, subsequent encounter: Secondary | ICD-10-CM | POA: Diagnosis not present

## 2022-04-11 DIAGNOSIS — S8001XD Contusion of right knee, subsequent encounter: Secondary | ICD-10-CM | POA: Diagnosis not present

## 2022-04-17 DIAGNOSIS — S8001XD Contusion of right knee, subsequent encounter: Secondary | ICD-10-CM | POA: Diagnosis not present

## 2022-04-17 DIAGNOSIS — M6281 Muscle weakness (generalized): Secondary | ICD-10-CM | POA: Diagnosis not present

## 2022-04-17 DIAGNOSIS — R262 Difficulty in walking, not elsewhere classified: Secondary | ICD-10-CM | POA: Diagnosis not present

## 2022-04-17 DIAGNOSIS — S93602D Unspecified sprain of left foot, subsequent encounter: Secondary | ICD-10-CM | POA: Diagnosis not present

## 2022-04-17 DIAGNOSIS — S8002XD Contusion of left knee, subsequent encounter: Secondary | ICD-10-CM | POA: Diagnosis not present

## 2022-04-21 ENCOUNTER — Other Ambulatory Visit: Payer: Self-pay | Admitting: Internal Medicine

## 2022-04-22 ENCOUNTER — Telehealth: Payer: Self-pay | Admitting: Acute Care

## 2022-04-22 NOTE — Telephone Encounter (Signed)
Called Kelly Delgado back this morning and got her on the schedule for   Kelly Delgado 29th 2pm With Kelly Croft NP   Would you like a chest xray Sarah before her visit that day?  Please advise

## 2022-04-24 ENCOUNTER — Ambulatory Visit: Payer: Medicare Other | Admitting: Nurse Practitioner

## 2022-04-24 ENCOUNTER — Ambulatory Visit (INDEPENDENT_AMBULATORY_CARE_PROVIDER_SITE_OTHER)
Admission: RE | Admit: 2022-04-24 | Discharge: 2022-04-24 | Disposition: A | Discharge status: Home / Self Care | Payer: Medicare Other | Source: Ambulatory Visit | Attending: Nurse Practitioner | Admitting: Nurse Practitioner

## 2022-04-24 ENCOUNTER — Encounter: Payer: Self-pay | Admitting: Nurse Practitioner

## 2022-04-24 VITALS — BP 132/80 | HR 84 | Temp 98.2°F | Ht 67.0 in | Wt 185.6 lb

## 2022-04-24 DIAGNOSIS — Z170 Estrogen receptor positive status [ER+]: Secondary | ICD-10-CM | POA: Diagnosis not present

## 2022-04-24 DIAGNOSIS — C50411 Malignant neoplasm of upper-outer quadrant of right female breast: Secondary | ICD-10-CM | POA: Diagnosis not present

## 2022-04-24 DIAGNOSIS — J441 Chronic obstructive pulmonary disease with (acute) exacerbation: Secondary | ICD-10-CM

## 2022-04-24 DIAGNOSIS — R059 Cough, unspecified: Secondary | ICD-10-CM | POA: Diagnosis not present

## 2022-04-24 MED ORDER — PREDNISONE 10 MG PO TABS: ORAL_TABLET | ORAL | 0 refills | Status: DC

## 2022-04-24 MED ORDER — DOXYCYCLINE HYCLATE 100 MG PO TABS: 100.0000 mg | ORAL_TABLET | Freq: Two times a day (BID) | ORAL | 0 refills | Status: AC

## 2022-04-24 NOTE — Assessment & Plan Note (Addendum)
AECOPD. Non-toxic and VS stable. She has had 3 exacerbations over the past 6 months; however, this has also been a very stressful time for her between her cancer diagnosis and her husband's health problems. We will treat with prednisone taper and empiric course of doxy. Check CXR today to evaluate for superimposed infection. Continue triple therapy and PRN albuterol - recommended she do nebs at least Twice daily until symptoms improved.   Patient Instructions  Continue Breztri 2 puffs Twice daily. Brush tongue and rinse mouth afterwards Continue Albuterol inhaler 2 puffs or 3 mL neb every 6 hours as needed for shortness of breath or wheezing. Notify if symptoms persist despite rescue inhaler/neb use.  Continue flonase 2 sprays each nostril daily   Prednisone taper. 4 tabs for 2 days, then 3 tabs for 2 days, 2 tabs for 2 days, then 1 tab for 2 days, then stop. Take in AM with food  Doxycycline 1 tab Twice daily for 7 days. Take with food. Wear sunscreen when outside and avoid direct sun exposure.  Mucinex 600 mg Twice daily for chest congestion/cough   Chest x ray. We will notify you of any abnormal results.  Follow up with Dr. Chase Caller or Joellen Jersey [name],NP in 2 weeks. If symptoms do not improve or worsen, please contact office for sooner follow up or seek emergency care.

## 2022-04-24 NOTE — Progress Notes (Signed)
'@Patient'  ID: Kelly Delgado, female    DOB: 1936/02/27, 86 y.o.   MRN: [id number]  Chief Complaint  Patient presents with   Follow-up    Follow up. Patient says she is doing better but the coughing and wheezing is still here.     Referring provider: Marda Stalker, PA-C  HPI: 86 year old female, former smoker followed for severe COPD.  She is a patient of Dr. Golden Pop and last seen in office on 02/25/2022 by Groce,NP.  She was recently diagnosed with stage Ia breast cancer status postlumpectomy and is being followed by Dr. Chryl Heck with oncology. Past medical history significant for CAD, hypertension, lung nodule.  TEST/EVENTS:   02/25/2022: [place] with Groce,NP for follow-up.  She was treated for a flareup in January with doxycycline and prednisone course.  She had good response to treatment.  She was then diagnosed with breast cancer in February.  At [place], reported that she has been doing okay.  She does feel like she has some increased wheezing and shortness of breath which she has been having to use her rescue inhaler more for.  Feels like this is related to allergies.  Denies any fever and secretions are clear.  Treated with prednisone taper.  Continued Flonase for allergies and advised adding on Claritin or other over-the-counter antihistamine for allergies.  Recommended to call if she had any changes in her secretions.  Continued on triple therapy with Breztri and as needed albuterol.  04/24/2022: Today-acute Patient presents today for acute visit.  She reports that she developed increased cough and shortness of breath at the beginning of the week.  She has also been wheezing more.  Her cough is productive with yellow sputum.  She has been using her nebs a couple times a day, which do help with her breathing and wheezing  She denies any fevers, night sweats, hemoptysis, anorexia, weight loss, lower extremity edema.  She continues on Stewart and has been using her albuterol more recently.  She  recently came off tamoxifen.  She had been having ongoing issues while being on it including fatigue and depressed mood.  She then developed a rash and decided she did not want to be on any longer.  She has plans to follow-up with oncology in August to determine next steps.  Allergies  Allergen Reactions   Ezetimibe Shortness Of Breath   Statins Other (See Comments)    Increased LFT's   Alendronate Sodium     felt sick   Ibandronic Acid     felt sick   Spiriva [Tiotropium Bromide Monohydrate] Itching    Immunization History  Administered Date(s) Administered   Influenza Split 06/26/2009, 09/05/2010, 07/17/2014, 07/23/2015, 08/14/2017   Influenza Whole 06/27/2012   Influenza, High Dose Seasonal PF 08/25/2012, 08/11/2016, 08/14/2017   Influenza,inj,Quad PF,6+ Mos 07/17/2014, 07/23/2015, 07/27/2016, 11/01/2018   Pneumococcal Conjugate-13 07/17/2014   Pneumococcal Polysaccharide-23 12/12/2008, 06/27/2012   Pneumococcal-Unspecified 06/27/2012   Tdap 06/29/2006, 02/07/2015    Past Medical History:  Diagnosis Date   Anxiety    Situatual   Asthmatic bronchitis    Basal cell carcinoma of breast 1990's?   "left side, burned it off" (06/07/2013)   Breast cancer ([facility]) 11/05/2021   COPD (chronic obstructive pulmonary disease) (HCC)    GERD (gastroesophageal reflux disease)    H/O hiatal hernia    Hypertension    Monoallelic mutation of CHEK2 gene in female patient 12/12/2021   Osteoporosis    Schatzki's ring    Shortness of breath    "  related to asthmatic bronchitis only" (06/07/2013)    Tobacco History: Social History   Tobacco Use  Smoking Status Former   Packs/day: 0.33   Years: 13.00   Total pack years: 4.29   Types: Cigarettes   Quit date: 11/27/1968   Years since quitting: 53.4  Smokeless Tobacco Never   Counseling given: Not Answered   Outpatient Medications Prior to Visit  Medication Sig Dispense Refill   albuterol (PROVENTIL) (2.5 MG/3ML) 0.083% nebulizer  solution TAKE 3 MLS BY NEBULIZER EVERY6 HOURS AS NEEDED FOR WHEEZING OR SHORTNESS OF BREATH 75 mL 6   albuterol (VENTOLIN HFA) 108 (90 Base) MCG/ACT inhaler Inhale 2 puffs into the lungs every 6 (six) hours as needed for wheezing or shortness of breath. 18 each 3   Ascorbic Acid (VITAMIN C PO) Take 1 tablet by mouth daily.     Budeson-Glycopyrrol-Formoterol (BREZTRI AEROSPHERE) 160-9-4.8 MCG/ACT AERO INHALE 2 PUFFS INTO THE LUNGS IN THE MORNING AND AT BEDTIME. 10.7 g 5   Calcium Carb-Cholecalciferol (CALCIUM 600 + D PO) Take 1 tablet by mouth every other day.      Cholecalciferol (VITAMIN D-3 PO) Take 1 capsule by mouth daily.     fluticasone (FLONASE) 50 MCG/ACT nasal spray Place 1 spray into both nostrils daily as needed for allergies or rhinitis.     hydrochlorothiazide (HYDRODIURIL) 25 MG tablet Take 25 mg by mouth daily as needed (swelling).     ketoconazole (NIZORAL) 2 % shampoo Apply 1 application  topically 2 (two) times a week. 120 mL 0   Multiple Vitamin (MULTIVITAMIN WITH MINERALS) TABS tablet Take 1 tablet by mouth daily.     ALPRAZolam (XANAX) 0.25 MG tablet Take 0.25 mg by mouth daily as needed. (Patient not taking: Reported on 04/24/2022)     tamoxifen (NOLVADEX) 20 MG tablet Take 1 tablet (20 mg total) by mouth daily. (Patient not taking: Reported on 04/24/2022) 30 tablet 3   predniSONE (DELTASONE) 10 MG tablet Prednisone taper; 10 mg tablets: 4 tabs x 2 days, 3 tabs x 2 days, 2 tabs x 2 days 1 tab x 2 days then stop. 20 tablet 0   No facility-administered medications prior to visit.     Review of Systems:   Constitutional: No weight loss or gain, night sweats, fevers, chills, fatigue, or lassitude. HEENT: No headaches, difficulty swallowing, tooth/dental problems, or sore throat. No sneezing, itching, ear ache, nasal congestion, or post nasal drip CV:  No chest pain, orthopnea, PND, swelling in lower extremities, anasarca, dizziness, palpitations, syncope Resp: +shortness of  breath with exertion (increased); productive cough; wheezing.  No hemoptysis.  No chest wall deformity Skin: +rash (improving). No lesions, ulcerations MSK:  No joint pain or swelling.  No decreased range of motion.  No back pain. Neuro: No dizziness or lightheadedness.  Psych: No depression or anxiety. Mood stable.     Physical Exam:  BP 132/80 (BP Location: Left Arm, Patient Position: Sitting, Cuff Size: Normal)   Pulse 84   Temp 98.2 F (36.8 C) (Oral)   Ht '5\' 7"'  (1.702 m)   Wt 185 lb 9.6 oz (84.2 kg)   SpO2 95%   BMI 29.07 kg/m   GEN: Pleasant, interactive, well-nourished; in no acute distress. HEENT:  Normocephalic and atraumatic. PERRLA. Sclera white. Nasal turbinates pink, moist and patent bilaterally. No rhinorrhea present. Oropharynx pink and moist, without exudate or edema. No lesions, ulcerations, or postnasal drip.  NECK:  Supple w/ fair ROM. No JVD present. Normal carotid impulses w/o bruits.  Thyroid symmetrical with no goiter or nodules palpated. No lymphadenopathy.   CV: RRR, no m/r/g, no peripheral edema. Pulses intact, +2 bilaterally. No cyanosis, pallor or clubbing. PULMONARY:  Unlabored, regular breathing. Minimal scattered wheeze bilaterally A&P. No accessory muscle use. No dullness to percussion. GI: BS present and normoactive. Soft, non-tender to palpation. No organomegaly or masses detected. No CVA tenderness. MSK: No erythema, warmth or tenderness. Cap refil <2 sec all extrem. No deformities or joint swelling noted.  Neuro: A/Ox3. No focal deficits noted.   Skin: Warm, no lesions. Rash on right upper back and occipital scalp  Psych: Normal affect and behavior. Judgement and thought content appropriate.     Lab Results:  CBC    Component Value Date/Time   WBC 9.6 11/28/2021 1452   RBC 5.12 (H) 11/28/2021 1452   HGB 15.2 (H) 11/28/2021 1452   HCT 46.5 (H) 11/28/2021 1452   PLT 242 11/28/2021 1452   MCV 90.8 11/28/2021 1452   MCH 29.7 11/28/2021 1452    MCHC 32.7 11/28/2021 1452   RDW 13.0 11/28/2021 1452   LYMPHSABS 1.7 08/06/2021 0929   MONOABS 0.3 08/06/2021 0929   EOSABS 1.2 (H) 08/06/2021 0929   BASOSABS 0.1 08/06/2021 0929    BMET    Component Value Date/Time   NA 141 11/28/2021 1452   K 3.8 11/28/2021 1452   CL 105 11/28/2021 1452   CO2 27 11/28/2021 1452   GLUCOSE 92 11/28/2021 1452   BUN 18 11/28/2021 1452   CREATININE 0.87 11/28/2021 1452   CALCIUM 9.8 11/28/2021 1452   GFRNONAA >60 11/28/2021 1452   GFRAA >60 07/17/2017 2213    BNP No results found for: "BNP"   Imaging:  No results found.       Latest Ref Rng & Units 09/12/2021   10:53 AM 07/23/2015    9:49 AM  PFT Results  FVC-Pre L 1.92  2.51   FVC-Predicted Pre % 64  77   FVC-Post L 2.28  2.55   FVC-Predicted Post % 76  79   Pre FEV1/FVC % % 68  74   Post FEV1/FCV % % 75  76   FEV1-Pre L 1.31  1.85   FEV1-Predicted Pre % 58  76   FEV1-Post L 1.71  1.93   DLCO uncorrected ml/min/mmHg 17.02    DLCO UNC% % 80    DLCO corrected ml/min/mmHg 16.92    DLCO COR %Predicted % 79    DLVA Predicted % 122    TLC L 4.51    TLC % Predicted % 79    RV % Predicted % 100      No results found for: "NITRICOXIDE"      Assessment & Plan:   COPD with acute exacerbation (HCC) AECOPD. Non-toxic and VS stable. She has had 3 exacerbations over the past 6 months; however, this has also been a very stressful time for her between her cancer diagnosis and her husband's health problems. We will treat with prednisone taper and empiric course of doxy. Check CXR today to evaluate for superimposed infection. Continue triple therapy and PRN albuterol - recommended she do nebs at least Twice daily until symptoms improved.   Patient Instructions  Continue Breztri 2 puffs Twice daily. Brush tongue and rinse mouth afterwards Continue Albuterol inhaler 2 puffs or 3 mL neb every 6 hours as needed for shortness of breath or wheezing. Notify if symptoms persist despite  rescue inhaler/neb use.  Continue flonase 2 sprays each nostril daily  Prednisone taper. 4 tabs for 2 days, then 3 tabs for 2 days, 2 tabs for 2 days, then 1 tab for 2 days, then stop. Take in AM with food  Doxycycline 1 tab Twice daily for 7 days. Take with food. Wear sunscreen when outside and avoid direct sun exposure.  Mucinex 600 mg Twice daily for chest congestion/cough   Chest x ray. We will notify you of any abnormal results.  Follow up with Dr. Chase Caller or Joellen Jersey Dequante Tremaine,NP in 2 weeks. If symptoms do not improve or worsen, please contact office for sooner follow up or seek emergency care.     Malignant neoplasm of upper-outer quadrant of right breast in female, estrogen receptor positive ([place]) S/p lumpectomy in 11/2021. Unable to tolerate Tamoxifen. She has no desire to restart therapy or try alternatives at this point as she would rather maintain the quality of life she has, which she plans to discuss with her oncologist in August.   I spent 32 minutes of dedicated to the care of this patient on the date of this encounter to include pre-visit review of records, face-to-face time with the patient discussing conditions above, post visit ordering of testing, clinical documentation with the electronic health record, making appropriate referrals as documented, and communicating necessary findings to members of the patients care team.  Clayton Bibles, NP 04/24/2022  Pt aware and understands NP's role.

## 2022-04-24 NOTE — Patient Instructions (Addendum)
Continue Breztri 2 puffs Twice daily. Brush tongue and rinse mouth afterwards Continue Albuterol inhaler 2 puffs or 3 mL neb every 6 hours as needed for shortness of breath or wheezing. Notify if symptoms persist despite rescue inhaler/neb use.  Continue flonase 2 sprays each nostril daily   Prednisone taper. 4 tabs for 2 days, then 3 tabs for 2 days, 2 tabs for 2 days, then 1 tab for 2 days, then stop. Take in AM with food  Doxycycline 1 tab Twice daily for 7 days. Take with food. Wear sunscreen when outside and avoid direct sun exposure.  Mucinex 600 mg Twice daily for chest congestion/cough   Chest x ray. We will notify you of any abnormal results.  Follow up with Dr. Chase Caller or Joellen Jersey Caitlyn Buchanan,NP in 2 weeks. If symptoms do not improve or worsen, please contact office for sooner follow up or seek emergency care.

## 2022-04-24 NOTE — Assessment & Plan Note (Addendum)
S/p lumpectomy in 11/2021. Unable to tolerate Tamoxifen. She has no desire to restart therapy or try alternatives at this point as she would rather maintain the quality of life she has, which she plans to discuss with her oncologist in August.

## 2022-04-25 NOTE — Progress Notes (Signed)
CXR without any evidence of infection. Chronic changes are unchanged.

## 2022-05-08 ENCOUNTER — Ambulatory Visit: Payer: Medicare Other | Admitting: Nurse Practitioner

## 2022-05-08 ENCOUNTER — Encounter: Payer: Self-pay | Admitting: Nurse Practitioner

## 2022-05-08 DIAGNOSIS — R21 Rash and other nonspecific skin eruption: Secondary | ICD-10-CM | POA: Insufficient documentation

## 2022-05-08 DIAGNOSIS — J849 Interstitial pulmonary disease, unspecified: Secondary | ICD-10-CM | POA: Diagnosis not present

## 2022-05-08 DIAGNOSIS — J449 Chronic obstructive pulmonary disease, unspecified: Secondary | ICD-10-CM | POA: Diagnosis not present

## 2022-05-08 NOTE — Patient Instructions (Addendum)
Continue Breztri 2 puffs Twice daily. Brush tongue and rinse mouth afterwards Continue Albuterol inhaler 2 puffs or 3 mL neb every 6 hours as needed for shortness of breath or wheezing. Notify if symptoms persist despite rescue inhaler/neb use.  Continue flonase 2 sprays each nostril daily   Try extra strength hydrocortisone cream on your rash since prednisone seemed to clear it up. If it makes if worse, stop use immediately. Call your primary care provider to follow your rash and discuss next steps  High resolution CT scan in September for 12 month follow up - someone will contact you for scheduling  Schedule pulmonary function testing in September   Follow up after PFTs and CT scan with Dr. Chase Caller or Al[facility] Aly. If symptoms do not improve or worsen, please contact office for sooner follow up or seek emergency care.

## 2022-05-08 NOTE — Progress Notes (Signed)
'@Patient'  ID: Kelly Delgado, female    DOB: [birth year], 86 y.o.   MRN: [id number]  Chief Complaint  Patient presents with   Follow-up    Pt states her breathing has been good for the past 2 weeks with the Prednisone.     Referring provider: Marda Stalker, Delgado  HPI: 86 year old female, former smoker followed for COPD.  She is a patient of Kelly Delgado and last seen in office on 02/25/2022 by Kelly Delgado.  She was recently diagnosed with stage Ia breast cancer status postlumpectomy and is being followed by Kelly Delgado with oncology. Past medical history significant for CAD, hypertension, lung nodule.  TEST/EVENTS:  06/27/2021 HRCT chest: atherosclerosis is present. No LAD. There are areas of mild septal thickening and severe thickening of the peribronchovascular interstitium with some linear areas of architectural distortion and volume loss, most evidence in RML and inferior segment of the lingula. Indeterminate for UIP. Moderate air trapping is present. There are small calcified granulomas.  09/12/2021 PFTs: FVC 64, FEV1 58, ratio 75, TLC 79, DLCOcor 79. Positive BD 09/23/2021: allergen panel low positive to dog dander 04/24/2022 CXR 2 view: slightly coarsened lung markings which appear chronic. No acute process.   02/25/2022: OV with Kelly Delgado for follow-up.  She was treated for a flareup in January with doxycycline and prednisone course.  She had good response to treatment.  She was then diagnosed with breast cancer in February.  At Kelly Delgado, reported that she has been doing okay.  She does feel like she has some increased wheezing and shortness of breath which she has been having to use her rescue inhaler more for.  Feels like this is related to allergies.  Denies any fever and secretions are clear.  Treated with prednisone taper.  Continued Flonase for allergies and advised adding on Claritin or other over-the-counter antihistamine for allergies.  Recommended to call if she had any changes in her  secretions.  Continued on triple therapy with Breztri and as needed albuterol.  04/24/2022: OV with Kelly Saidi NP for acute visit.  She reports that she developed increased cough and shortness of breath at the beginning of the week.  She has also been wheezing more.  Her cough is productive with yellow sputum.  She has been using her nebs a couple times a day, which do help with her breathing and wheezing  She denies any fevers, night sweats, hemoptysis, anorexia, weight loss, lower extremity edema.  She continues on Kelly Delgado and has been using her albuterol more recently.  She recently came off tamoxifen.  She had been having ongoing issues while being on it including fatigue and depressed mood.  She then developed a rash and decided she did not want to be on any longer.  She has plans to follow-up with oncology in August to determine next steps. Treated for AECOPD with empiric doxy and prednisone taper. Continue Breztri and prn albuterol. CXR without superimposed infection/acute process.   05/08/2022: Today - follow up Patient presents today for follow up after being treated for AECOPD with prednisone taper and doxy course. She reports feeling significantly better today. Feels like the medications really helped her. Her breathing has returned to her baseline and her cough is minimal, non-productive. She is ready to get back to the gym and start exercising regularly again. She denies any fevers, hemoptysis, night sweats, wheezing, weight loss. She still has been fighting the rash on the back of her head and on her upper back. It was originally thought to  be related to Tamoxifen and/or possibly fungal in nature. She has since stopped Tamoxifen. Her oncologist prescribed ketoconazole shampoo, which she felt made the rash worse. She told me today that the rash completely went away when she was on the doxy and prednisone. It returned about 2-3 days later. She describes it as red and itchy, occasionally is flaky. Denies any  exudate, warmth or open lesions.   Allergies  Allergen Reactions   Ezetimibe Shortness Of Breath   Statins Other (See Comments)    Increased LFT's   Alendronate Sodium     felt sick   Ibandronic Acid     felt sick   Spiriva [Tiotropium Bromide Monohydrate] Itching    Immunization History  Administered Date(s) Administered   Influenza Split 06/26/2009, 09/05/2010, 07/17/2014, 07/23/2015, 08/14/2017   Influenza Whole 06/27/2012   Influenza, High Dose Seasonal PF 08/25/2012, 08/11/2016, 08/14/2017   Influenza,inj,Quad PF,6+ Mos 07/17/2014, 07/23/2015, 07/27/2016, 11/01/2018   Pneumococcal Conjugate-13 07/17/2014   Pneumococcal Polysaccharide-23 12/12/2008, 06/27/2012   Pneumococcal-Unspecified 06/27/2012   Tdap 06/29/2006, 02/07/2015    Past Medical History:  Diagnosis Date   Anxiety    Situatual   Asthmatic bronchitis    Basal cell carcinoma of breast 1990's?   "left side, burned it off" (06/07/2013)   Breast cancer ([place]) 11/05/2021   COPD (chronic obstructive pulmonary disease) (HCC)    GERD (gastroesophageal reflux disease)    H/O hiatal hernia    Hypertension    Monoallelic mutation of CHEK2 gene in female patient 12/12/2021   Osteoporosis    Schatzki's ring    Shortness of breath    "related to asthmatic bronchitis only" (06/07/2013)    Tobacco History: Social History   Tobacco Use  Smoking Status Former   Packs/day: 0.33   Years: 13.00   Total pack years: 4.29   Types: Cigarettes   Quit date: 11/27/1968   Years since quitting: 53.4  Smokeless Tobacco Never   Counseling given: Not Answered   Outpatient Medications Prior to Visit  Medication Sig Dispense Refill   albuterol (PROVENTIL) (2.5 MG/3ML) 0.083% nebulizer solution TAKE 3 MLS BY NEBULIZER EVERY6 HOURS AS NEEDED FOR WHEEZING OR SHORTNESS OF BREATH 75 mL 6   albuterol (VENTOLIN HFA) 108 (90 Base) MCG/ACT inhaler Inhale 2 puffs into the lungs every 6 (six) hours as needed for wheezing or shortness of  breath. 18 each 3   Ascorbic Acid (VITAMIN C PO) Take 1 tablet by mouth daily.     Budeson-Glycopyrrol-Formoterol (BREZTRI AEROSPHERE) 160-9-4.8 MCG/ACT AERO INHALE 2 PUFFS INTO THE LUNGS IN THE MORNING AND AT BEDTIME. 10.7 g 5   Calcium Carb-Cholecalciferol (CALCIUM 600 + D PO) Take 1 tablet by mouth every other day.      Cholecalciferol (VITAMIN D-3 PO) Take 1 capsule by mouth daily.     fluticasone (FLONASE) 50 MCG/ACT nasal spray Place 1 spray into both nostrils daily as needed for allergies or rhinitis.     hydrochlorothiazide (HYDRODIURIL) 25 MG tablet Take 25 mg by mouth daily as needed (swelling).     ketoconazole (NIZORAL) 2 % shampoo Apply 1 application  topically 2 (two) times a week. 120 mL 0   Multiple Vitamin (MULTIVITAMIN WITH MINERALS) TABS tablet Take 1 tablet by mouth daily.     ALPRAZolam (XANAX) 0.25 MG tablet Take 0.25 mg by mouth daily as needed. (Patient not taking: Reported on 04/24/2022)     predniSONE (DELTASONE) 10 MG tablet 4 tabs for 2 days, then 3 tabs for 2 days, 2  tabs for 2 days, then 1 tab for 2 days, then stop 20 tablet 0   tamoxifen (NOLVADEX) 20 MG tablet Take 1 tablet (20 mg total) by mouth daily. (Patient not taking: Reported on 04/24/2022) 30 tablet 3   No facility-administered medications prior to visit.     Review of Systems:   Constitutional: No weight loss or gain, night sweats, fevers, chills, fatigue, or lassitude. HEENT: No headaches, difficulty swallowing, tooth/dental problems, or sore throat. No sneezing, itching, ear ache, nasal congestion, or post nasal drip CV:  No chest pain, orthopnea, PND, swelling in lower extremities, anasarca, dizziness, palpitations, syncope Resp: +shortness of breath with exertion (increased); productive cough; wheezing.  No hemoptysis.  No chest wall deformity Skin: +rash. No lesions, ulcerations MSK:  No joint pain or swelling.  No decreased range of motion.  No back pain. Neuro: No dizziness or lightheadedness.   Psych: No depression or anxiety. Mood stable.     Physical Exam:  BP 120/84 (BP Location: Right Arm, Cuff Size: Large)   Pulse 86   Temp 98 F (36.7 C) (Oral)   Ht '5\' 7"'  (1.702 m)   Wt 188 lb (85.3 kg)   SpO2 98%   BMI 29.44 kg/m   GEN: Pleasant, interactive, well-nourished; in no acute distress. HEENT:  Normocephalic and atraumatic. PERRLA. Sclera white. Nasal turbinates pink, moist and patent bilaterally. No rhinorrhea present. Oropharynx pink and moist, without exudate or edema. No lesions, ulcerations, or postnasal drip.  NECK:  Supple w/ fair ROM. No JVD present. Normal carotid impulses w/o bruits. Thyroid symmetrical with no goiter or nodules palpated. No lymphadenopathy.   CV: RRR, no m/r/g, no peripheral edema. Pulses intact, +2 bilaterally. No cyanosis, pallor or clubbing. PULMONARY:  Unlabored, regular breathing. Diminished bases bilaterally; otherwise clear w/o wheezes/rales/rhonchi. No accessory muscle use. No dullness to percussion. GI: BS present and normoactive. Soft, non-tender to palpation. No organomegaly or masses detected. No CVA tenderness. MSK: No erythema, warmth or tenderness. Cap refil <2 sec all extrem. No deformities or joint swelling noted.  Neuro: A/Ox3. No focal deficits noted.   Skin: Warm, no lesions. Rash on right upper back and occipital scalp  Psych: Normal affect and behavior. Judgement and thought content appropriate.     Lab Results:  CBC    Component Value Date/Time   WBC 9.6 11/28/2021 1452   RBC 5.12 (H) 11/28/2021 1452   HGB 15.2 (H) 11/28/2021 1452   HCT 46.5 (H) 11/28/2021 1452   PLT 242 11/28/2021 1452   MCV 90.8 11/28/2021 1452   MCH 29.7 11/28/2021 1452   MCHC 32.7 11/28/2021 1452   RDW 13.0 11/28/2021 1452   LYMPHSABS 1.7 08/06/2021 0929   MONOABS 0.3 08/06/2021 0929   EOSABS 1.2 (H) 08/06/2021 0929   BASOSABS 0.1 08/06/2021 0929    BMET    Component Value Date/Time   NA 141 11/28/2021 1452   K 3.8 11/28/2021 1452    CL 105 11/28/2021 1452   CO2 27 11/28/2021 1452   GLUCOSE 92 11/28/2021 1452   BUN 18 11/28/2021 1452   CREATININE 0.87 11/28/2021 1452   CALCIUM 9.8 11/28/2021 1452   GFRNONAA >60 11/28/2021 1452   GFRAA >60 07/17/2017 2213    BNP No results found for: "BNP"   Imaging:  DG Chest 2 View  Result Date: 04/24/2022 CLINICAL DATA:  Productive cough. EXAM: CHEST - 2 VIEW COMPARISON:  11/11/2021 FINDINGS: Slightly coarse lung markings appear chronic. No new airspace disease or lung consolidation. Heart and  mediastinum are within normal limits. No acute bone abnormality. Trachea is midline. Negative for a pneumothorax. IMPRESSION: Chronic lung changes without acute findings. Electronically Signed   By: Markus Daft M.D.   On: 04/24/2022 15:03         Latest Ref Rng & Units 09/12/2021   10:53 AM 07/23/2015    9:49 AM  PFT Results  FVC-Pre L 1.92  2.51   FVC-Predicted Pre % 64  77   FVC-Post L 2.28  2.55   FVC-Predicted Post % 76  79   Pre FEV1/FVC % % 68  74   Post FEV1/FCV % % 75  76   FEV1-Pre L 1.31  1.85   FEV1-Predicted Pre % 58  76   FEV1-Post L 1.71  1.93   DLCO uncorrected ml/min/mmHg 17.02    DLCO UNC% % 80    DLCO corrected ml/min/mmHg 16.92    DLCO COR %Predicted % 79    DLVA Predicted % 122    TLC L 4.51    TLC % Predicted % 79    RV % Predicted % 100      No results found for: "NITRICOXIDE"      Assessment & Plan:   Stage 2 moderate COPD by GOLD classification ([place]) Resolved AECOPD. Appears to have recovered well. She has had multiple exacerbations over the last year and a half; was doing better this year with last exacerbation in January but then developed increased SOB and productive cough in [place], treated with prednisone and empiric doxy. Previous workup showed HRCT with very mild ILD and elevated eosinophils. RAST was negative. Continue triple therapy with Breztri and PRN albuterol. Recheck CBC at follow up - if eosinophils remain elevated, we will add  on singulair.   Patient Instructions  Continue Breztri 2 puffs Twice daily. Brush tongue and rinse mouth afterwards Continue Albuterol inhaler 2 puffs or 3 mL neb every 6 hours as needed for shortness of breath or wheezing. Notify if symptoms persist despite rescue inhaler/neb use.  Continue flonase 2 sprays each nostril daily   Try extra strength hydrocortisone cream on your rash since prednisone seemed to clear it up. If it makes if worse, stop use immediately. Call your primary care provider to follow your rash and discuss next steps  High resolution CT scan in September for 12 month follow up - someone will contact you for scheduling  Schedule pulmonary function testing in September   Follow up after PFTs and CT scan with Dr. Chase Caller or Alanson Aly. If symptoms do not improve or worsen, please contact office for sooner follow up or seek emergency care.    ILD (interstitial lung disease) (HCC) Mild ILD changes on previous HRCT. Indeterminate for UIP. Please see above plan.  Rash Rash developed while on Tamoxifen. She has since been taken off due to multiple side effects. She was tried on Ketoconazole shampoo and felt as though this worsened her rash. She did have improvement with doxycycline and prednisone courses and rash had resolved; now returned. Advised to try topical hydrocortisone and schedule f/u with PCP for further evaluation.   I spent 32 minutes of dedicated to the care of this patient on the date of this encounter to include pre-visit review of records, face-to-face time with the patient discussing conditions above, post visit ordering of testing, clinical documentation with the electronic health record, making appropriate referrals as documented, and communicating necessary findings to members of the patients care team.  Clayton Bibles, NP 05/08/2022  Pt aware and understands NP's role.

## 2022-05-08 NOTE — Assessment & Plan Note (Signed)
Resolved AECOPD. Appears to have recovered well. She has had multiple exacerbations over the last year and a half; was doing better this year with last exacerbation in January but then developed increased SOB and productive cough in [place], treated with prednisone and empiric doxy. Previous workup showed HRCT with very mild ILD and elevated eosinophils. RAST was negative. Continue triple therapy with Breztri and PRN albuterol. Recheck CBC at follow up - if eosinophils remain elevated, we will add on singulair.   Patient Instructions  Continue Breztri 2 puffs Twice daily. Brush tongue and rinse mouth afterwards Continue Albuterol inhaler 2 puffs or 3 mL neb every 6 hours as needed for shortness of breath or wheezing. Notify if symptoms persist despite rescue inhaler/neb use.  Continue flonase 2 sprays each nostril daily   Try extra strength hydrocortisone cream on your rash since prednisone seemed to clear it up. If it makes if worse, stop use immediately. Call your primary care provider to follow your rash and discuss next steps  High resolution CT scan in September for 12 month follow up - someone will contact you for scheduling  Schedule pulmonary function testing in September   Follow up after PFTs and CT scan with Dr. Chase Caller or Alanson Aly. If symptoms do not improve or worsen, please contact office for sooner follow up or seek emergency care.

## 2022-05-08 NOTE — Assessment & Plan Note (Signed)
Rash developed while on Tamoxifen. She has since been taken off due to multiple side effects. She was tried on Ketoconazole shampoo and felt as though this worsened her rash. She did have improvement with doxycycline and prednisone courses and rash had resolved; now returned. Advised to try topical hydrocortisone and schedule f/u with PCP for further evaluation.

## 2022-05-08 NOTE — Assessment & Plan Note (Addendum)
Mild ILD changes on previous HRCT. Indeterminate for UIP. Please see above plan.

## 2022-05-30 ENCOUNTER — Other Ambulatory Visit: Payer: Self-pay

## 2022-05-30 ENCOUNTER — Encounter: Payer: Self-pay | Admitting: Hematology and Oncology

## 2022-05-30 ENCOUNTER — Inpatient Hospital Stay: Payer: Medicare Other | Attending: Hematology and Oncology | Admitting: Hematology and Oncology

## 2022-05-30 VITALS — BP 185/98 | HR 75 | Temp 97.9°F | Resp 16 | Ht 67.0 in | Wt 187.0 lb

## 2022-05-30 DIAGNOSIS — Z170 Estrogen receptor positive status [ER+]: Secondary | ICD-10-CM | POA: Diagnosis not present

## 2022-05-30 DIAGNOSIS — I10 Essential (primary) hypertension: Secondary | ICD-10-CM | POA: Insufficient documentation

## 2022-05-30 DIAGNOSIS — Z90710 Acquired absence of both cervix and uterus: Secondary | ICD-10-CM | POA: Diagnosis not present

## 2022-05-30 DIAGNOSIS — Z806 Family history of leukemia: Secondary | ICD-10-CM | POA: Insufficient documentation

## 2022-05-30 DIAGNOSIS — Z8042 Family history of malignant neoplasm of prostate: Secondary | ICD-10-CM | POA: Diagnosis not present

## 2022-05-30 DIAGNOSIS — C50412 Malignant neoplasm of upper-outer quadrant of left female breast: Secondary | ICD-10-CM | POA: Diagnosis not present

## 2022-05-30 DIAGNOSIS — C50411 Malignant neoplasm of upper-outer quadrant of right female breast: Secondary | ICD-10-CM | POA: Insufficient documentation

## 2022-05-30 DIAGNOSIS — Z803 Family history of malignant neoplasm of breast: Secondary | ICD-10-CM | POA: Diagnosis not present

## 2022-05-30 DIAGNOSIS — Z87891 Personal history of nicotine dependence: Secondary | ICD-10-CM | POA: Insufficient documentation

## 2022-05-30 NOTE — Progress Notes (Signed)
[place] NOTE  Patient Care Team: Marda Stalker, PA-C as PCP - General (Family Medicine) Brand Males, MD as Consulting Physician (Pulmonary Disease)  CHIEF COMPLAINTS/PURPOSE OF CONSULTATION:  Newly diagnosed breast cancer  HISTORY OF PRESENTING ILLNESS:   Kelly Delgado 86 y.o. female is here because of recent diagnosis of right breast IDC I reviewed her records extensively and collaborated the history with the patient.  SUMMARY OF ONCOLOGIC HISTORY: Oncology History  Malignant neoplasm of upper-outer quadrant of right breast in female, estrogen receptor positive ([place])  11/25/2021 Initial Diagnosis   Malignant neoplasm of upper-outer quadrant of right breast in female, estrogen receptor positive ([place])   11/27/2021 Cancer Staging   Staging form: Breast, AJCC 8th Edition - Clinical stage from 11/27/2021: Stage IA (cT1c, cN0, cM0, G2, ER+, PR+, HER2-) - Signed by Hayden Pedro, PA-C on 11/27/2021 Stage prefix: Initial diagnosis Method of lymph node assessment: Clinical Histologic grading system: 3 grade system   12/05/2021 Surgery   Right breast lumpectomy showed invasive ductal carcinoma with extracellular mucin, 1.2 cm, grade 2.  Ductal carcinoma in situ.  All surgical margins negative for carcinoma, invasive carcinoma 0.2 cm from inferior margin lymph nodes not submitted.  Initial prognostic showed ER 100% positive strong staining, PR 100% positive strong staining, HER2 equivocal with IHC.  Negative with FISH ratio 1.28, Ki-67 1%    12/11/2021 Genetic Testing   Pathogenic variant detected in CHEK2 at c.1100delC ([id number]).  Variant of uncertain significance detected in MSH2 at  p.S87C (c.260C>G).  The report date is 12/11/2021.   The [employer]-Cancer+RNAinsight panel offered by [facility] includes sequencing and rearrangement analysis for the following 47 genes:  APC, ATM, AXIN2, BARD1, BMPR1A, BRCA1, BRCA2, BRIP1, CDH1, CDK4, CDKN2A,  CHEK2, DICER1, EPCAM, GREM1, HOXB13, MEN1, MLH1, MSH2, MSH3, MSH6, MUTYH, NBN, NF1, NF2, NTHL1, PALB2, PMS2, POLD1, POLE, PTEN, RAD51C, RAD51D, RECQL, RET, SDHA, SDHAF2, SDHB, SDHC, SDHD, SMAD4, SMARCA4, STK11, TP53, TSC1, TSC2, and VHL.  RNA data is routinely analyzed for use in variant interpretation for all genes.     Interval history  She is here for follow up on tamoxifen. She tried both 20 mg and 10 mg, she felt very tired and depressed, so discontinued it. She said she has a mild migraine this AM. She took a ibuprofen. She doesn't want to try any other anti estrogen therapy.  She is very clear on maintaining her quality of life.  She is willing to do mammograms and follow-up with [place] annually. Bone density showed osteoporosis. She tried some bisphosphonates long time ago, she couldn't tolerate it. Rest of the pertinent 10 point ROS reviewed and negative.  MEDICAL HISTORY:  Past Medical History:  Diagnosis Date   Anxiety    Situatual   Asthmatic bronchitis    Basal cell carcinoma of breast 1990's?   "left side, burned it off" (06/07/2013)   Breast cancer ([place]) 11/05/2021   COPD (chronic obstructive pulmonary disease) (HCC)    GERD (gastroesophageal reflux disease)    H/O hiatal hernia    Hypertension    Monoallelic mutation of CHEK2 gene in female patient 12/12/2021   Osteoporosis    Schatzki's ring    Shortness of breath    "related to asthmatic bronchitis only" (06/07/2013)    SURGICAL HISTORY: Past Surgical History:  Procedure Laterality Date   ABDOMINAL HYSTERECTOMY  06/28/1979   BREAST BIOPSY Right 11/05/2021   BREAST LUMPECTOMY WITH RADIOACTIVE SEED LOCALIZATION Right 12/05/2021   Procedure: RIGHT BREAST LUMPECTOMY WITH  RADIOACTIVE SEED LOCALIZATION;  Surgeon: Donnie Mesa, MD;  Location: [facility];  Service: General;  Laterality: Right;   CATARACT EXTRACTION W/ INTRAOCULAR LENS  IMPLANT, BILATERAL Bilateral 06/27/1989   DILATION AND CURETTAGE OF UTERUS  06/28/1979   "1"  (06/07/2013)   ESOPHAGEAL DILATION  10/27/2010   "just once" (06/07/2013)   HIP PINNING,CANNULATED Right 10/09/2014   Procedure: CANNULATED HIP PINNING;  Surgeon: Renette Butters, MD;  Location: [facility];  Service: Orthopedics;  Laterality: Right;   URETHRAL DIVERTICULUM REPAIR  06/27/1969    SOCIAL HISTORY: Social History   Socioeconomic History   Marital status: Married    Spouse name: Not on file   Number of children: Not on file   Years of education: Not on file   Highest education level: Not on file  Occupational History   Not on file  Tobacco Use   Smoking status: Former    Packs/day: 0.33    Years: 13.00    Total pack years: 4.29    Types: Cigarettes    Quit date: 11/27/1968    Years since quitting: 53.5   Smokeless tobacco: Never  Vaping Use   Vaping Use: Never used  Substance and Sexual Activity   Alcohol use: Yes   Drug use: No   Sexual activity: Yes  Other Topics Concern   Not on file  Social History Narrative   Not on file   Social Determinants of Health   Financial Resource Strain: Not on file  Food Insecurity: Not on file  Transportation Needs: Not on file  Physical Activity: Not on file  Stress: Not on file  Social Connections: Not on file  Intimate Partner Violence: Not At Risk (11/28/2021)   Humiliation, Afraid, Rape, and Kick questionnaire    Fear of Current or Ex-Partner: No    Emotionally Abused: No    Physically Abused: No    Sexually Abused: No    FAMILY HISTORY: Family History  Problem Relation Age of Onset   Cancer Mother    Stroke Mother    Hypertension Mother    Prostate cancer Father    Cancer Maternal Aunt        unknown type; dx after 40   Leukemia Maternal Uncle    Breast cancer Cousin        maternal female cousin; dx 50s   Breast cancer Cousin        maternal female cousin; dx 63s   Breast cancer Half-Brother 37    ALLERGIES:  is allergic to ezetimibe, statins, alendronate sodium, ibandronic acid, and spiriva [tiotropium  bromide monohydrate].  MEDICATIONS:  Current Outpatient Medications  Medication Sig Dispense Refill   albuterol (PROVENTIL) (2.5 MG/3ML) 0.083% nebulizer solution TAKE 3 MLS BY NEBULIZER EVERY6 HOURS AS NEEDED FOR WHEEZING OR SHORTNESS OF BREATH 75 mL 6   albuterol (VENTOLIN HFA) 108 (90 Base) MCG/ACT inhaler Inhale 2 puffs into the lungs every 6 (six) hours as needed for wheezing or shortness of breath. 18 each 3   Ascorbic Acid (VITAMIN C PO) Take 1 tablet by mouth daily.     Budeson-Glycopyrrol-Formoterol (BREZTRI AEROSPHERE) 160-9-4.8 MCG/ACT AERO INHALE 2 PUFFS INTO THE LUNGS IN THE MORNING AND AT BEDTIME. 10.7 g 5   Calcium Carb-Cholecalciferol (CALCIUM 600 + D PO) Take 1 tablet by mouth every other day.      Cholecalciferol (VITAMIN D-3 PO) Take 1 capsule by mouth daily.     fluticasone (FLONASE) 50 MCG/ACT nasal spray Place 1 spray into both nostrils daily  as needed for allergies or rhinitis.     hydrochlorothiazide (HYDRODIURIL) 25 MG tablet Take 25 mg by mouth daily as needed (swelling).     ketoconazole (NIZORAL) 2 % shampoo Apply 1 application  topically 2 (two) times a week. 120 mL 0   Multiple Vitamin (MULTIVITAMIN WITH MINERALS) TABS tablet Take 1 tablet by mouth daily.     No current facility-administered medications for this visit.     PHYSICAL EXAMINATION: ECOG PERFORMANCE STATUS: 0 - Asymptomatic  Vitals:   05/30/22 1135  BP: (!) 185/98  Pulse: 75  Resp: 16  Temp: 97.9 F (36.6 C)  SpO2: 96%     Filed Weights   05/30/22 1135  Weight: 187 lb (84.8 kg)    Physical Exam Constitutional:      Appearance: Normal appearance.  Chest:     Comments: Bilateral breast examined.  Postop changes.  Otherwise no palpable masses or regional adenopathy. Musculoskeletal:     Cervical back: Normal range of motion. No rigidity.  Lymphadenopathy:     Cervical: No cervical adenopathy.  Neurological:     Mental Status: She is alert.     LABORATORY DATA:  I have  reviewed the data as listed Lab Results  Component Value Date   WBC 9.6 11/28/2021   HGB 15.2 (H) 11/28/2021   HCT 46.5 (H) 11/28/2021   MCV 90.8 11/28/2021   PLT 242 11/28/2021   Lab Results  Component Value Date   NA 141 11/28/2021   K 3.8 11/28/2021   CL 105 11/28/2021   CO2 27 11/28/2021    RADIOGRAPHIC STUDIES: I have personally reviewed the radiological reports and agreed with the findings in the report.  ASSESSMENT AND PLAN:  Malignant neoplasm of upper-outer quadrant of right breast in female, estrogen receptor positive ([place]) This is a very pleasant 86 year old female patient with past medical history significant for COPD, hypertension referred to medical oncology for new diagnosis of right breast invasive ductal carcinoma noted on a screening mammogram.  During her initial visit, given her strong ER/PR positive small tumor with grade 2, we have discussed about proceeding with upfront surgery followed by discussion about adjuvant radiation and antiestrogen therapy. She declined adjuvant radiation.  She is now on antiestrogen with tamoxifen.  She tried tamoxifen for 3 weeks but she felt extremely fatigued hence discontinued.  We then tried a low-dose but she started feeling very emotional, crying a lot, depressed and hence decided to stop taking it.  She is not willing to try any follow-up antiestrogen therapy.  She would like to have her quality of life results.  She understands the benefits and risks.  She will continue annual mammograms and return to clinic with [place] once a year.  Physical examination today without any concerns. We will proceed with diagnostic mammogram, ordered for December 2023.   Self breast exam recommended.  Return to clinic in 1 year or sooner as needed.   Total time spent: 20 minutes counseling about her options for antiestrogen therapy, mechanism of action and adverse effects  All questions were answered. The patient knows to call the clinic with any  problems, questions or concerns.    Benay Pike, MD 05/30/22

## 2022-05-30 NOTE — Assessment & Plan Note (Addendum)
This is a very pleasant 86 year old female patient with past medical history significant for COPD, hypertension referred to medical oncology for new diagnosis of right breast invasive ductal carcinoma noted on a screening mammogram.  During her initial visit, given her strong ER/PR positive small tumor with grade 2, we have discussed about proceeding with upfront surgery followed by discussion about adjuvant radiation and antiestrogen therapy. She declined adjuvant radiation.  She is now on antiestrogen with tamoxifen.  She tried tamoxifen for 3 weeks but she felt extremely fatigued hence discontinued.  We then tried a low-dose but she started feeling very emotional, crying a lot, depressed and hence decided to stop taking it.  She is not willing to try any follow-up antiestrogen therapy.  She would like to have her quality of life results.  She understands the benefits and risks.  She will continue annual mammograms and return to clinic with [place] once a year.  Physical examination today without any concerns. We will proceed with diagnostic mammogram, ordered for December 2023.   Self breast exam recommended.  Return to clinic in 1 year or sooner as needed.

## 2022-06-03 ENCOUNTER — Ambulatory Visit: Payer: Medicare Other | Admitting: Acute Care

## 2022-07-03 ENCOUNTER — Other Ambulatory Visit: Payer: Self-pay | Admitting: Internal Medicine

## 2022-07-03 DIAGNOSIS — R0602 Shortness of breath: Secondary | ICD-10-CM

## 2022-07-04 NOTE — Telephone Encounter (Signed)
Hey PCCs!   This patient was supposed to have  HRCT this month. It looks like someone forgot to order the scan. Can you all help with getting this scheduled possibly this month?

## 2022-07-10 NOTE — Telephone Encounter (Signed)
GI scheduled this from the [facility] on 9/11.  Pt was called to schedule.  Will route back to triage to close out MyChart message.

## 2022-07-28 ENCOUNTER — Ambulatory Visit
Admission: RE | Admit: 2022-07-28 | Discharge: 2022-07-28 | Disposition: A | Discharge status: Home / Self Care | Payer: Medicare Other | Source: Ambulatory Visit | Attending: Nurse Practitioner | Admitting: Nurse Practitioner

## 2022-07-28 DIAGNOSIS — R0602 Shortness of breath: Secondary | ICD-10-CM | POA: Diagnosis not present

## 2022-07-28 DIAGNOSIS — R911 Solitary pulmonary nodule: Secondary | ICD-10-CM | POA: Diagnosis not present

## 2022-07-28 DIAGNOSIS — I700 Atherosclerosis of aorta: Secondary | ICD-10-CM | POA: Diagnosis not present

## 2022-07-28 DIAGNOSIS — Z853 Personal history of malignant neoplasm of breast: Secondary | ICD-10-CM | POA: Diagnosis not present

## 2022-08-01 NOTE — Progress Notes (Signed)
Please notify patient that her HRCT chest was stable, which is good news. Changes are likely just chronic post infectious/inflammatory. They are mild. Still a chance she has some underlying interstitial lung disease, but this is not favored given the appearance/stability. We will continue to monitor movig forward. Thanks!

## 2022-08-15 ENCOUNTER — Ambulatory Visit (INDEPENDENT_AMBULATORY_CARE_PROVIDER_SITE_OTHER): Payer: Medicare Other | Admitting: Internal Medicine

## 2022-08-15 ENCOUNTER — Encounter: Payer: Self-pay | Admitting: Internal Medicine

## 2022-08-15 ENCOUNTER — Ambulatory Visit: Payer: Medicare Other | Admitting: Internal Medicine

## 2022-08-15 VITALS — BP 124/70 | HR 72 | Temp 98.0°F | Ht 67.0 in | Wt 188.0 lb

## 2022-08-15 DIAGNOSIS — R918 Other nonspecific abnormal finding of lung field: Secondary | ICD-10-CM

## 2022-08-15 DIAGNOSIS — Z7185 Encounter for immunization safety counseling: Secondary | ICD-10-CM

## 2022-08-15 DIAGNOSIS — D721 Eosinophilia, unspecified: Secondary | ICD-10-CM | POA: Diagnosis not present

## 2022-08-15 DIAGNOSIS — J441 Chronic obstructive pulmonary disease with (acute) exacerbation: Secondary | ICD-10-CM | POA: Diagnosis not present

## 2022-08-15 DIAGNOSIS — J449 Chronic obstructive pulmonary disease, unspecified: Secondary | ICD-10-CM

## 2022-08-15 LAB — PULMONARY FUNCTION TEST
DL/VA % pred: 96 %
DL/VA: 3.82 ml/min/mmHg/L
DLCO cor % pred: 74 %
DLCO cor: 15.3 ml/min/mmHg
DLCO unc % pred: 74 %
DLCO unc: 15.3 ml/min/mmHg
FEF 25-75 Post: 1.59 L/sec
FEF 25-75 Pre: 0.99 L/sec
FEF2575-%Change-Post: 59 %
FEF2575-%Pred-Post: 115 %
FEF2575-%Pred-Pre: 72 %
FEV1-%Change-Post: 12 %
FEV1-%Pred-Post: 81 %
FEV1-%Pred-Pre: 72 %
FEV1-Post: 1.71 L
FEV1-Pre: 1.52 L
FEV1FVC-%Change-Post: 8 %
FEV1FVC-%Pred-Pre: 98 %
FEV6-%Change-Post: 4 %
FEV6-%Pred-Post: 82 %
FEV6-%Pred-Pre: 79 %
FEV6-Post: 2.2 L
FEV6-Pre: 2.11 L
FEV6FVC-%Pred-Post: 105 %
FEV6FVC-%Pred-Pre: 105 %
FVC-%Change-Post: 4 %
FVC-%Pred-Post: 78 %
FVC-%Pred-Pre: 75 %
FVC-Post: 2.2 L
FVC-Pre: 2.11 L
Post FEV1/FVC ratio: 78 %
Post FEV6/FVC ratio: 100 %
Pre FEV1/FVC ratio: 72 %
Pre FEV6/FVC Ratio: 100 %
RV % pred: 97 %
RV: 2.54 L
TLC % pred: 83 %
TLC: 4.62 L

## 2022-08-15 LAB — CBC WITH DIFFERENTIAL/PLATELET
Basophils Absolute: 0.1 10*3/uL (ref 0.0–0.1)
Basophils Relative: 0.6 % (ref 0.0–3.0)
Eosinophils Absolute: 0.6 10*3/uL (ref 0.0–0.7)
Eosinophils Relative: 6.8 % — ABNORMAL HIGH (ref 0.0–5.0)
HCT: 43.1 % (ref 36.0–46.0)
Hemoglobin: 14.3 g/dL (ref 12.0–15.0)
Lymphocytes Relative: 34 % (ref 12.0–46.0)
Lymphs Abs: 2.8 10*3/uL (ref 0.7–4.0)
MCHC: 33.1 g/dL (ref 30.0–36.0)
MCV: 89.9 fl (ref 78.0–100.0)
Monocytes Absolute: 0.4 10*3/uL (ref 0.1–1.0)
Monocytes Relative: 4.3 % (ref 3.0–12.0)
Neutro Abs: 4.5 10*3/uL (ref 1.4–7.7)
Neutrophils Relative %: 54.3 % (ref 43.0–77.0)
Platelets: 197 10*3/uL (ref 150.0–400.0)
RBC: 4.79 Mil/uL (ref 3.87–5.11)
RDW: 14 % (ref 11.5–15.5)
WBC: 8.3 10*3/uL (ref 4.0–10.5)

## 2022-08-15 NOTE — Addendum Note (Signed)
Addended by: Lorretta Harp on: 08/15/2022 01:28 PM   Modules accepted: Orders

## 2022-08-15 NOTE — Patient Instructions (Addendum)
Stage 2 moderate COPD by GOLD classification (HCC) COPD, frequent exacerbations (HCC) Eosinophilia, unspecified type  - current stabl diseae -You have had a recent history in the last 1-2 years of frequent flareups which could be driven due to stress or inflammatory cells such as eosinophils in the lung  Plan  - check cbc with diff (recheck eos tthat was high a year ago) -We discussed options about doing a research protocol of subcutaneous injection for frequent flareups versus taking a tablet call Roflumilast versus doing Dupixent injection  -We took a shared decision making to continue the status quo - continue breztril scheduled with albuterol as need  Pulmonary infiltrates -concerning for interstitial lung abnormalities  -October 2023 pulmonary function test and CT scan stable  Plan  - Continued observation -Repeat spirometry and DLCO in 4-6 months  Vaccine counseling  -Respect deferral on flu shot, RSV vaccine and also COVID-vaccine  Followup -4 to 6 months or sooner if needed

## 2022-08-15 NOTE — Progress Notes (Signed)
OV 07/23/2015  Chief Complaint  Patient presents with   Follow-up    Pt here to discuss CT results and PFT results. Pt states breathing is better since last visit. She is now walking 2 miles three times a week     Follow-up Gold stage II COPD: Currently only on Symbicort. She stopped Spiriva due to itching. She also stopped [place] in past due to cost issues. With Symbicort she is doing really well. She is walking 2 miles a week. Her effort tolerance is improved. She is hardly ever symptomatic. FEV1 today 1.85 L/76% and a ratio of 74 showing very mild obstruction. There are no new issues. She will have a flu shot today. We discussed switching to a long-acting anticholinergic associated with long-acting beta agonist and skipping inhaled steroidal given recent history of hip fracture but she's not interested to change due to cost issues and because she's currently doing well  Multiple lung nodules: She had CT scan of the chest 08/03/2015: She has multiple lung nodules largest 4 mm. This is all stable since October 2014 completing near 2 year stability. She is currently 86 years of age and therefore will not qualify for future low-dose lung cancer screening CT   OV 04/30/2016  Chief Complaint  Patient presents with   Follow-up    breathing has been doing well.  no concerns.CAT Score: 11    Follow-up moderate COPD he is a 9 month routine follow-up. She is doing well. No urgent care visits. No emergency room visits. No prednisone use. No new medical problems diagnosis. Few days [place] the [place] and now she has a scratchy throat but several weeks ago she had a cold and did not flareup in the COPD exacerbation. She wants to keep an eye on her cold. She's not interested in inhaler change. She likes her Symbicort. She has enough refills and will call us if needed. She's got good quality of life.   OV 02/09/2017  Chief Complaint  Patient presents with   Follow-up    Pt states she  feels the Symbicort 160 helps better than the 80, she is currently on the 80 dose. Pt c/o throat tickle, hoarseness, and SOB with over activity.  Pt denies chest congestion, CP/tightness.     Follow-up moderate COPD  Since last visit I dropped her Symbicort to the lower dose. She's had 2 exacerbations since then. She also believes that the lower dose Symbicort is not controlling her COPD as well but she's not fully sure if it is a steroid effect on just an individual variation depending on the season. In December she does not want to go up on the steroid dose to a full dose Symbicort. In the past. Event caused itching so this time she wants to hold off on any anticholinergic but if she continues to feel that lower dose Symbicort is not controlling a COPD well then she'll be open to adding a different anticholinergic other than Spiriva going up on the steroid dose. Otherwise doing well. The spring season though is causing some postnasal drainage and increased cough and ticklish throat.   OV 08/14/2017  Chief Complaint  Patient presents with   Follow-up    Pt states that she has been doing good. Pt is currently on presnisone due to arthritis but has two days left of that. Was a little SOB but the prednisone she is currently on is helping with that.   Follow-up moderate COPD  Last seen April 2018. Normally does 9 month follow-up. But at last visit. Drop the steroid dose and therefore she is here in 6 months. COPD stable no exacerbations. Recently shehad neck spasm and is currently finishing a prednisone taper given by orthopedics and this is actually helping his COPD symptoms. COPD cat score is 7 and only shows mild symptom burden. She wants flu shot. No new issues. Chest x-ray September 2018 per report is clear   OV 05/18/2018  Chief Complaint  Patient presents with   Follow-up    Pt states breathing has been worse with the humidity and hot weather. States she has been having to use rescue  inhaler at least twice a day. Pt also states she has been a little more SOB recently, has an occ. cough with clear mucus, but denies any CP.    Follow-up moderate COPD  This is a routine follow-up. She normally does 9 month follow-ups. Last visit October 2018 periods in the interim she's continued to do well. COPD cat score is 6 and slightly better/same compared to the before. She does daily exercises at [facility] which helps. Recently with humidity she is dealing with some increased postnasal drip for which she takes nasal steroids as needed. In the interim has been no medical issues are ER visits or urgent care visits or prednisone use or new medical diagnoses. Last chest x-ray September 2018 clear      OV 06/14/2020  Subjective:  Patient ID: Shyna Mezo, female , DOB: 1936/07/03 , age 64 y.o. , MRN: [id number] , [address]: [address] [place] [id number]   06/14/2020 -   Chief Complaint  Patient presents with   Follow-up    Productive cough with clear phlegm   Follow-up moderate COPD.  HPI Malya Posa 86 y.o. -last seen by myself just over 2 years ago in July 2019.  Then in January 2020 she had a COPD exacerbation and seen by nurse practitioner.  Then after that the COVID-19 pandemic struck.  Since then she has been isolating.  We have not been able to see her till today.  She says she is doing well.  She is on Symbicort.  She wants refills.  COPD CAT score is stable.  No interim medical issues no ER visits.  She normally gets vaccines.  However she is reluctant to get the Covid vaccine because of lack of long-term side effects.  She recalls the thalidomide disaster that happened when it was 5 years into the drug and people discovered the side effects.  I acknowledge that we do not have long-term side effect profile on Covid vaccine because of the short duration of the pandemic.  She is really good at social distancing and masking.  We discussed Regeneron monoclonal antibodies as a  way to mitigate her risk in terms of both prophylaxis and treating infection.       OV 03/14/2021  Subjective:  Patient ID: [name], female , DOB: 05-Aug-1936 , age 54 y.o. , MRN: [id number] , [address]: [address] [place] [id number] PCP Marda Stalker, PA-C Patient Care Team: Marda Stalker, PA-C as PCP - General (Family Medicine) Brand Males, MD as Consulting Physician (Pulmonary Disease)  This Provider for this visit: Treatment Team:  Attending Provider: Brand Males, MD    03/14/2021 -   Chief Complaint  Patient presents with   Follow-up    Pt states she was doing okay since last visit until about 1 week ago due to having problems from  pollen and states she was having more problems with SOB and also states that she has been coughing which is also keeping her up at night.     HPI Devani Lehnert 86 y.o. -COPD follow-up.  In this visit she says she continues to use her Symbicort.  She brings her husband with her.  She normally exercises at the [facility].  She is not vaccinated against COVID and will not get vaccinated.  She says that she is not coming to close contact with anyone.  No clusters.  Other than when she exercises she is masking.  She avoids people.  However she got exposed to pollen late last week.  Then approximately on Friday, Mar 08, 2021 she worked in the garden with some hanging buds.  Then starting Sunday, Mar 10, 2021 she started getting cough with yellow sputum.  She sounds congested which she says is no sinus drainage.  She feels tired she is more short of breath.  There might be some associated increase in wheezing.  No fever.  The same day she checked her COVID rapid antigen test at home and was negative.  Husband is not sick.  She is not vaccinated against COVID no other sick contacts.  She does not think it is COVID but is willing to get tested with PCR.       OV 07/17/2021  Subjective:  Patient ID: Ytzel Rondinelli, female , DOB:  Mar 01, 1936 , age 86 y.o. , MRN: [id number] , ADDRESS: [place] [address] PCP Marda Stalker, PA-C Patient Care Team: Marda Stalker, PA-C as PCP - General (Family Medicine) Brand Males, MD as Consulting Physician (Pulmonary Disease)  This Provider for this visit: Treatment Team:  Attending Provider: Brand Males, MD    07/17/2021 -   Chief Complaint  Patient presents with   Follow-up    Pt states she had covid about 2 weeks ago. States she is tired all the time and feels like she cannot get her strength back. States she has been coughing some and also has had some increased SOB.   Gold stage II COPD on Symbicort   HPI Nnenna Hodges 85 y.o. -she has Gold stage II COPD on Symbicort.  Last visit May 2022.  Had refused COVID-vaccine.  After that couple of for telephone calls for COPD exacerbation treated with prednisone.  Most recently towards the end of August 2022.  Then on 06/27/2021 we did high-resolution CT chest.  A suggestion of early ILD.  Then several days later around Labor Day 2022 developed COVID .  She did not call us for any antiviral.  She rested and then missed her daughter's wedding and then subsequently got better.  Currently having some residual cough fatigue and shortness of breath and occasional white sputum.  Things are getting better but she feels prednisone could help her.  Today the first day out of the house.  No leg swelling.  No hemoptysis.  No fever or chills. CLINICAL DATA:  86 year old female with history of dyspnea on exertion. Increasing shortness of breath since May 2022. Currently on fourth round of prednisone treatment. History of COPD. Evaluate for interstitial lung disease.   EXAM: CT CHEST WITHOUT CONTRAST   TECHNIQUE: Multidetector CT imaging of the chest was performed following the standard protocol without intravenous contrast. High resolution imaging of the lungs, as well as inspiratory and expiratory  imaging, was performed.   COMPARISON:  Chest CT 07/04/2015.   FINDINGS: Cardiovascular: Heart size is borderline enlarged. There  is no significant pericardial fluid, thickening or pericardial calcification. Aortic atherosclerosis. No definite coronary artery calcifications. Mild calcifications of the mitral annulus.   Mediastinum/Nodes: No pathologically enlarged mediastinal or hilar lymph nodes. Please note that accurate exclusion of hilar adenopathy is limited on noncontrast CT scans. Esophagus is unremarkable in appearance. No axillary lymphadenopathy.   Lungs/Pleura: High-resolution images demonstrate areas of mild septal thickening and severe thickening of the peribronchovascular interstitium with some linear areas of architectural distortion and volume loss, most evident in the right middle lobe and inferior segment of the lingula. No significant regions of traction bronchiectasis or honeycombing. Inspiratory and expiratory imaging demonstrates moderate air trapping indicative of small airways disease. No acute consolidative airspace disease. No pleural effusions. Small calcified granulomas are present. No other suspicious appearing pulmonary nodules or masses are noted.   Upper Abdomen: Aortic atherosclerosis.   Musculoskeletal: There are no aggressive appearing lytic or blastic lesions noted in the visualized portions of the skeleton.   IMPRESSION: 1. The appearance of the lungs may suggest very early or mild interstitial lung disease, with a spectrum of findings considered indeterminate for usual interstitial pneumonia (UIP) per current ATS guidelines, as detailed above. If there is persistent clinical concern for interstitial lung disease, repeat high-resolution chest CT would be recommended in 12 months to assess for temporal changes in the appearance of the lung parenchyma. 2. Aortic atherosclerosis.   Aortic Atherosclerosis (ICD10-I70.0).     Electronically  Signed   By: Vinnie Langton M.D.   On: 06/27/2021 15:40      OV 08/30/2021  Subjective:  Patient ID: Anyra Gelder, female , DOB: [birth year] , age 73 y.o. , MRN: [id number] , ADDRESS: [place] [address] PCP Marda Stalker, PA-C Patient Care Team: Marda Stalker, PA-C as PCP - General (Family Medicine) Brand Males, MD as Consulting Physician (Pulmonary Disease)  This Provider for this visit: Treatment Team:  Attending Provider: Brand Males, MD    08/30/2021 -   Chief Complaint  Patient presents with   Follow-up    Patient has not been feeling good for the last 2 weeks. She called 911 this morning for her breathing and they gave her a breathing treatment. Lots of wheezing, increased shortness of breath. Productive Cough with yellow sputum    Gold stage II COPD on Symbicort  Possible early ILD on CT scan of the chest early September September 2022 pre-COVID COVID  Labor Day 2022 history of outpatient COVID  Multiple COPD exacerbations in 2022 As of HPI Jesenya Vigeant 85 y.o. -last visit 17 July 2021.  At that time we gave her prednisone 5 days.  Then around August 05, 2021 she called and we had to give another prednisone.  Because of repeated COPD exacerbations did IgE this was normal.  Her blood eosinophils was high at 1200 cells per cubic millimeter.  (In 2014 it was 600 cells but then subsequently became 0 cells].  Her rheumatoid factor is slightly positive.  Rest of the autoimmune is normal.  She now tells me that the last that she finished prednisone taper she started on cholesterol medication ZETIA.Marland Kitchen  She is not sure of the exact date but she suspects this was around a few weeks ago.  Then 3 days into it she started feeling short of breath.  She stopped it but she feels respiratory exacerbation is worse she feels is because of his diarrhea.  She is continue to wheeze and have chest tightness and cough no sputum production.  She  called 911 today.  They came to home and gave albuterol nebulizer.  They did not take her to the ER.  She decided to keep this appointment.  She has upcoming PFT appointment and clinic visit in a couple weeks.  She is really frustrated with repeated exacerbations.  She is wondering what is going on.  Today there is audible wheezing but she was not using accessory muscles.  Her PFT appointment is pending.  COPD CAT score shows significant deterioration.  See below.   OV 09/12/2021  Subjective:  Patient ID: Anadelia Paolucci, female , DOB: 05-27-1936 , age 49 y.o. , MRN: [id number] , ADDRESS: [place] [address] PCP Marda Stalker, PA-C Patient Care Team: Marda Stalker, PA-C as PCP - General (Family Medicine) Brand Males, MD as Consulting Physician (Pulmonary Disease)  This Provider for this visit: Treatment Team:  Attending Provider: Brand Males, MD   09/12/2021 -   Chief Complaint  Patient presents with   Follow-up    Judithann Sauger is working better     HPI Serenitie Winiarski 85 y.o. -returns for follow-up.  Seen earlier in the month for COPD exacerbation and repeated exacerbations.  Given prednisone and also given Breztri.  We are looking for other causes for multiple repeated exacerbations.  I wanted RAST allergy panel to be done 2 weeks after she finishes prednisone.  She only prednisone prednisone a few days ago.  She is going to wait and get it.  After the recent steroid burst she is feeling better.  She did have repeat pulmonary function testing it shows a slight decline compared to 2016.  Nevertheless she is feeling better with the [facility].  She is beginning to go back to the [facility] and start exercising.  She is now walking half a mile [baseline 2 miles].  She is okay with approach of watchful waiting at this point.  She did a walking desaturation test and did not desaturate.  She walked 185 feet x 3 laps in the office.    CT Chest data  No results  found.   02/25/2022: OV with Groce,NP for follow-up.  She was treated for a flareup in January with doxycycline and prednisone course.  She had good response to treatment.  She was then diagnosed with breast cancer in February.  At [place], reported that she has been doing okay.  She does feel like she has some increased wheezing and shortness of breath which she has been having to use her rescue inhaler more for.  Feels like this is related to allergies.  Denies any fever and secretions are clear.  Treated with prednisone taper.  Continued Flonase for allergies and advised adding on Claritin or other over-the-counter antihistamine for allergies.  Recommended to call if she had any changes in her secretions.  Continued on triple therapy with Breztri and as needed albuterol.  04/24/2022: OV with Cobb NP for acute visit.  She reports that she developed increased cough and shortness of breath at the beginning of the week.  She has also been wheezing more.  Her cough is productive with yellow sputum.  She has been using her nebs a couple times a day, which do help with her breathing and wheezing  She denies any fevers, night sweats, hemoptysis, anorexia, weight loss, lower extremity edema.  She continues on [place] and has been using her albuterol more recently.  She recently came off tamoxifen.  She had been having ongoing issues while being on it including fatigue  and depressed mood.  She then developed a rash and decided she did not want to be on any longer.  She has plans to follow-up with oncology in August to determine next steps. Treated for AECOPD with empiric doxy and prednisone taper. Continue Breztri and prn albuterol. CXR without superimposed infection/acute process.   05/08/2022: Today - follow up Patient presents today for follow up after being treated for AECOPD with prednisone taper and doxy course. She reports feeling significantly better today. Feels like the medications really helped her. Her breathing  has returned to her baseline and her cough is minimal, non-productive. She is ready to get back to the gym and start exercising regularly again. She denies any fevers, hemoptysis, night sweats, wheezing, weight loss. She still has been fighting the rash on the back of her head and on her upper back. It was originally thought to be related to Tamoxifen and/or possibly fungal in nature. She has since stopped Tamoxifen. Her oncologist prescribed ketoconazole shampoo, which she felt made the rash worse. She told me today that the rash completely went away when she was on the doxy and prednisone. It returned about 2-3 days later. She describes it as red and itchy, occasionally is flaky. Denies any exudate, warmth or open lesions.   OV 08/15/2022  Subjective:  Patient ID: Kevona Caren Macadam, female , DOB: 08-Feb-1936 , age 54 y.o. , MRN: [id number] , ADDRESS: [place] [id number] PCP Marda Stalker, PA-C Patient Care Team: Marda Stalker, PA-C as PCP - General (Family Medicine) Brand Males, MD as Consulting Physician (Pulmonary Disease)  This Provider for this visit: Treatment Team:  Attending Provider: Brand Males, MD  [place] positive RF - 47 in Oct 22 (other serology negative)  NEg Quant Gold Oct 2022  Gold stage II COPD on Symbicort  Possible early ILD on CT scan of the chest early September September 2022 pre-COVID COVID  Labor Day 2022 history of outpatient COVID  Multiple COPD exacerbations    08/15/2022 -   Chief Complaint  Patient presents with   Follow-up    PFT performed today. Pt states she has been doing well since last visit and denies any complaints.     HPI Maize G Cypert 86 y.o. -returns for follow-up.  At this point in time she is on triple inhaler therapy with albuterol as needed.  She has no longer had COPD exacerbation since summer 2023.  She says that because of stress and other issues such as breast cancer in February 2023 and  a fall in May 2023 she was having frequent flareups.  She says since then she has learned to control her stress and the COPD exacerbation is better.  A year ago she had blood eosinophils that was significantly elevated.  She is agreeable to check it but we discussed options of Roflumilast versus enrolling in a research study versus standard of care Dupixent therapy.  She prefers to maintain the status quo.  She is suspected to have ILD.  I personally visualized the CT chest from October 2023.  She has interstitial lung abnormalities.  Her pulmonary function test and CT scan that I personally visualized are all stable.  We discussed her respiratory vaccines and she does not want to take any.    CT Chest data  No results found.    PFT     Latest Ref Rng & Units 08/15/2022   11:55 AM 09/12/2021   10:53 AM 07/23/2015    9:49 AM  PFT Results  FVC-Pre L 2.11  P 1.92  2.51   FVC-Predicted Pre % 75  P 64  77   FVC-Post L 2.20  P 2.28  2.55   FVC-Predicted Post % 78  P 76  79   Pre FEV1/FVC % % 72  P 68  74   Post FEV1/FCV % % 78  P 75  76   FEV1-Pre L 1.52  P 1.31  1.85   FEV1-Predicted Pre % 72  P 58  76   FEV1-Post L 1.71  P 1.71  1.93   DLCO uncorrected ml/min/mmHg 15.30  P 17.02    DLCO UNC% % 74  P 80    DLCO corrected ml/min/mmHg 15.30  P 16.92    DLCO COR %Predicted % 74  P 79    DLVA Predicted % 96  P 122    TLC L 4.62  P 4.51    TLC % Predicted % 83  P 79    RV % Predicted % 97  P 100      P Preliminary result       has a past medical history of Anxiety, Asthmatic bronchitis, Basal cell carcinoma of breast (1990's?), Breast cancer (HCC) (11/05/2021), COPD (chronic obstructive pulmonary disease) (HCC), GERD (gastroesophageal reflux disease), H/O hiatal hernia, Hypertension, 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   reports that she quit smoking about 53 years ago. Her smoking use included  cigarettes. She has a 4.29 pack-year smoking history. She has never used smokeless tobacco.  Past Surgical History:  Procedure Laterality Date   ABDOMINAL HYSTERECTOMY  06/28/1979   BREAST BIOPSY Right 11/05/2021   BREAST LUMPECTOMY WITH RADIOACTIVE SEED LOCALIZATION Right 12/05/2021   Procedure: RIGHT BREAST LUMPECTOMY WITH RADIOACTIVE SEED LOCALIZATION;  Surgeon: Donnie Mesa, MD;  Location: [facility];  Service: General;  Laterality: Right;   CATARACT EXTRACTION W/ INTRAOCULAR LENS  IMPLANT, BILATERAL Bilateral 06/27/1989   DILATION AND CURETTAGE OF UTERUS  06/28/1979   "1" (06/07/2013)   ESOPHAGEAL DILATION  10/27/2010   "just once" (06/07/2013)   HIP PINNING,CANNULATED Right 10/09/2014   Procedure: CANNULATED HIP PINNING;  Surgeon: Renette Butters, MD;  Location: [facility];  Service: Orthopedics;  Laterality: Right;   URETHRAL DIVERTICULUM REPAIR  06/27/1969    Allergies  Allergen Reactions   Ezetimibe Shortness Of Breath   Statins Other (See Comments)    Increased LFT's   Alendronate Sodium     felt sick   Ibandronic Acid     felt sick   Spiriva [Tiotropium Bromide Monohydrate] Itching    Immunization History  Administered Date(s) Administered   Influenza Split 06/26/2009, 09/05/2010, 07/17/2014, 07/23/2015, 08/14/2017   Influenza Whole 06/27/2012   Influenza, High Dose Seasonal PF 08/25/2012, 08/11/2016, 08/14/2017   Influenza,inj,Quad PF,6+ Mos 07/17/2014, 07/23/2015, 07/27/2016, 11/01/2018   Pneumococcal Conjugate-13 07/17/2014   Pneumococcal Polysaccharide-23 12/12/2008, 06/27/2012   Pneumococcal-Unspecified 06/27/2012   Tdap 06/29/2006, 02/07/2015    Family History  Problem Relation Age of Onset   Cancer Mother    Stroke Mother    Hypertension Mother    Prostate cancer Father    Cancer Maternal Aunt        unknown type; dx after 100   Leukemia Maternal Uncle    Breast cancer Cousin        maternal female cousin; dx 71s   Breast cancer Cousin        maternal  female cousin; dx  40s   Breast cancer Half-Brother 10     Current Outpatient Medications:    albuterol (PROVENTIL) (2.5 MG/3ML) 0.083% nebulizer solution, TAKE 3 MLS BY NEBULIZER EVERY6 HOURS AS NEEDED FOR WHEEZING OR SHORTNESS OF BREATH, Disp: 75 mL, Rfl: 6   albuterol (VENTOLIN HFA) 108 (90 Base) MCG/ACT inhaler, Inhale 2 puffs into the lungs every 6 (six) hours as needed for wheezing or shortness of breath., Disp: 18 each, Rfl: 3   Ascorbic Acid (VITAMIN C PO), Take 1 tablet by mouth daily., Disp: , Rfl:    BREZTRI AEROSPHERE 160-9-4.8 MCG/ACT AERO, INHALE 2 PUFFS INTO THE LUNGS IN THE MORNING AND AT BEDTIME., Disp: 10.7 each, Rfl: 5   Calcium Carb-Cholecalciferol (CALCIUM 600 + D PO), Take 1 tablet by mouth every other day. , Disp: , Rfl:    Cholecalciferol (VITAMIN D-3 PO), Take 1 capsule by mouth daily., Disp: , Rfl:    fluticasone (FLONASE) 50 MCG/ACT nasal spray, Place 1 spray into both nostrils daily as needed for allergies or rhinitis., Disp: , Rfl:    hydrochlorothiazide (HYDRODIURIL) 25 MG tablet, Take 25 mg by mouth daily as needed (swelling)., Disp: , Rfl:    ketoconazole (NIZORAL) 2 % shampoo, Apply 1 application  topically 2 (two) times a week., Disp: 120 mL, Rfl: 0   Multiple Vitamin (MULTIVITAMIN WITH MINERALS) TABS tablet, Take 1 tablet by mouth daily., Disp: , Rfl:       Objective:   Vitals:   08/15/22 1304  BP: 124/70  Pulse: 72  Temp: 98 F (36.7 C)  TempSrc: Oral  SpO2: 97%  Weight: 188 lb (85.3 kg)  Height: '5\' 7"'  (1.702 m)    Estimated body mass index is 29.44 kg/m as calculated from the following:   Height as of this encounter: '5\' 7"'  (1.702 m).   Weight as of this encounter: 188 lb (85.3 kg).  '@WEIGHTCHANGE' @  Filed Weights   08/15/22 1304  Weight: 188 lb (85.3 kg)     Physical Exam   General: No distress. Looks well Neuro: Alert and Oriented x 3. GCS 15. Speech normal Psych: Pleasant Resp:  Barrel Chest - no.  Wheeze - no, Crackles - no, No  overt respiratory distress CVS: Normal heart sounds. Murmurs - no Ext: Stigmata of Connective Tissue Disease - no HEENT: Normal upper airway. PEERL +. No post nasal drip        Assessment:       ICD-10-CM   1. Stage 2 moderate COPD by GOLD classification (HCC)  J44.9 CBC with Differential/Platelet    2. COPD, frequent exacerbations ([place])  J44.1     3. Eosinophilia, unspecified type  D72.10     4. Pulmonary infiltrates  R91.8     5. Vaccine counseling  Z71.85          Plan:     Patient Instructions  Stage 2 moderate COPD by GOLD classification ([place]) COPD, frequent exacerbations ([place]) Eosinophilia, unspecified type  - current stabl diseae -You have had a recent history in the last 1-2 years of frequent flareups which could be driven due to stress or inflammatory cells such as eosinophils in the lung  Plan  - check cbc with diff (recheck eos tthat was high a year ago) -We discussed options about doing a research protocol of subcutaneous injection for frequent flareups versus taking a tablet call Roflumilast versus doing Dupixent injection  -We took a shared decision making to continue the status quo - continue breztril scheduled with albuterol as  need  Pulmonary infiltrates -concerning for interstitial lung abnormalities  -October 2023 pulmonary function test and CT scan stable  Plan  - Continued observation -Repeat spirometry and DLCO in 4-6 months  Vaccine counseling  -Respect deferral on flu shot, RSV vaccine and also COVID-vaccine  Followup -4 to 6 months or sooner if needed    SIGNATURE    Dr. Brand Males, M.D., F.C.C.P,  Pulmonary and Critical Care Medicine Staff Physician, [place] Director - Interstitial Lung Disease  Program  [facility] [place] [place], [place], [address]  Pager: [phone], If no answer or between  15:00h - 7:00h: call [phone] Telephone: [phone]  1:25 PM 08/15/2022

## 2022-08-15 NOTE — Progress Notes (Signed)
PFT done today. 

## 2022-08-25 DIAGNOSIS — E7800 Pure hypercholesterolemia, unspecified: Secondary | ICD-10-CM | POA: Diagnosis not present

## 2022-08-25 DIAGNOSIS — K219 Gastro-esophageal reflux disease without esophagitis: Secondary | ICD-10-CM | POA: Diagnosis not present

## 2022-08-25 DIAGNOSIS — Z0000 Encounter for general adult medical examination without abnormal findings: Secondary | ICD-10-CM | POA: Diagnosis not present

## 2022-08-25 DIAGNOSIS — J45909 Unspecified asthma, uncomplicated: Secondary | ICD-10-CM | POA: Diagnosis not present

## 2022-08-25 DIAGNOSIS — J449 Chronic obstructive pulmonary disease, unspecified: Secondary | ICD-10-CM | POA: Diagnosis not present

## 2022-08-25 DIAGNOSIS — M810 Age-related osteoporosis without current pathological fracture: Secondary | ICD-10-CM | POA: Diagnosis not present

## 2022-08-25 DIAGNOSIS — I10 Essential (primary) hypertension: Secondary | ICD-10-CM | POA: Diagnosis not present

## 2022-08-28 DIAGNOSIS — E7800 Pure hypercholesterolemia, unspecified: Secondary | ICD-10-CM | POA: Diagnosis not present

## 2022-09-12 DIAGNOSIS — M25551 Pain in right hip: Secondary | ICD-10-CM | POA: Diagnosis not present

## 2022-09-12 DIAGNOSIS — M545 Low back pain, unspecified: Secondary | ICD-10-CM | POA: Diagnosis not present

## 2022-09-30 DIAGNOSIS — M545 Low back pain, unspecified: Secondary | ICD-10-CM | POA: Diagnosis not present

## 2022-09-30 DIAGNOSIS — M6281 Muscle weakness (generalized): Secondary | ICD-10-CM | POA: Diagnosis not present

## 2022-09-30 DIAGNOSIS — M5136 Other intervertebral disc degeneration, lumbar region: Secondary | ICD-10-CM | POA: Diagnosis not present

## 2022-10-06 ENCOUNTER — Ambulatory Visit
Admission: RE | Admit: 2022-10-06 | Discharge: 2022-10-06 | Disposition: A | Discharge status: Home / Self Care | Payer: Medicare Other | Source: Ambulatory Visit | Attending: Hematology and Oncology | Admitting: Hematology and Oncology

## 2022-10-06 DIAGNOSIS — R928 Other abnormal and inconclusive findings on diagnostic imaging of breast: Secondary | ICD-10-CM | POA: Diagnosis not present

## 2022-10-06 DIAGNOSIS — Z853 Personal history of malignant neoplasm of breast: Secondary | ICD-10-CM | POA: Diagnosis not present

## 2022-10-06 DIAGNOSIS — Z170 Estrogen receptor positive status [ER+]: Secondary | ICD-10-CM

## 2022-10-07 DIAGNOSIS — M6281 Muscle weakness (generalized): Secondary | ICD-10-CM | POA: Diagnosis not present

## 2022-10-07 DIAGNOSIS — M545 Low back pain, unspecified: Secondary | ICD-10-CM | POA: Diagnosis not present

## 2022-10-07 DIAGNOSIS — M5136 Other intervertebral disc degeneration, lumbar region: Secondary | ICD-10-CM | POA: Diagnosis not present

## 2022-10-21 DIAGNOSIS — M6281 Muscle weakness (generalized): Secondary | ICD-10-CM | POA: Diagnosis not present

## 2022-10-21 DIAGNOSIS — M5136 Other intervertebral disc degeneration, lumbar region: Secondary | ICD-10-CM | POA: Diagnosis not present

## 2022-10-21 DIAGNOSIS — M545 Low back pain, unspecified: Secondary | ICD-10-CM | POA: Diagnosis not present

## 2022-10-24 DIAGNOSIS — M5136 Other intervertebral disc degeneration, lumbar region: Secondary | ICD-10-CM | POA: Diagnosis not present

## 2022-11-25 ENCOUNTER — Other Ambulatory Visit: Payer: Self-pay | Admitting: Gastroenterology

## 2022-11-25 DIAGNOSIS — R945 Abnormal results of liver function studies: Secondary | ICD-10-CM

## 2022-11-25 DIAGNOSIS — R7989 Other specified abnormal findings of blood chemistry: Secondary | ICD-10-CM | POA: Diagnosis not present

## 2022-12-02 ENCOUNTER — Other Ambulatory Visit: Payer: Self-pay | Admitting: Internal Medicine

## 2022-12-16 ENCOUNTER — Ambulatory Visit
Admission: RE | Admit: 2022-12-16 | Discharge: 2022-12-16 | Disposition: A | Discharge status: Home / Self Care | Payer: Medicare Other | Source: Ambulatory Visit | Attending: Gastroenterology | Admitting: Gastroenterology

## 2022-12-16 DIAGNOSIS — K760 Fatty (change of) liver, not elsewhere classified: Secondary | ICD-10-CM | POA: Diagnosis not present

## 2022-12-16 DIAGNOSIS — R945 Abnormal results of liver function studies: Secondary | ICD-10-CM

## 2022-12-16 DIAGNOSIS — R7989 Other specified abnormal findings of blood chemistry: Secondary | ICD-10-CM

## 2022-12-30 ENCOUNTER — Encounter: Payer: Self-pay | Admitting: Internal Medicine

## 2022-12-30 ENCOUNTER — Ambulatory Visit (INDEPENDENT_AMBULATORY_CARE_PROVIDER_SITE_OTHER): Payer: Medicare Other | Admitting: Internal Medicine

## 2022-12-30 ENCOUNTER — Ambulatory Visit: Payer: Medicare Other | Admitting: Internal Medicine

## 2022-12-30 VITALS — BP 128/78 | HR 74 | Ht 68.0 in | Wt 182.6 lb

## 2022-12-30 DIAGNOSIS — J441 Chronic obstructive pulmonary disease with (acute) exacerbation: Secondary | ICD-10-CM | POA: Diagnosis not present

## 2022-12-30 DIAGNOSIS — J449 Chronic obstructive pulmonary disease, unspecified: Secondary | ICD-10-CM | POA: Diagnosis not present

## 2022-12-30 DIAGNOSIS — R918 Other nonspecific abnormal finding of lung field: Secondary | ICD-10-CM | POA: Diagnosis not present

## 2022-12-30 DIAGNOSIS — D721 Eosinophilia, unspecified: Secondary | ICD-10-CM

## 2022-12-30 NOTE — Addendum Note (Signed)
Addended by: Alvin Critchley on: 12/30/2022 04:22 PM   Modules accepted: Orders

## 2022-12-30 NOTE — Progress Notes (Signed)
Spiro/DLCO performed today.  

## 2022-12-30 NOTE — Progress Notes (Addendum)
OV 07/23/2015  Chief Complaint  Patient presents with   Follow-up    Pt here to discuss CT results and PFT results. Pt states breathing is better since last visit. She is now walking 2 miles three times a week     Follow-up Gold stage II COPD: Currently only on Symbicort. She stopped Spiriva due to itching. She also stopped [place] in past due to cost issues. With Symbicort she is doing really well. She is walking 2 miles a week. Her effort tolerance is improved. She is hardly ever symptomatic. FEV1 today 1.85 L/76% and a ratio of 74 showing very mild obstruction. There are no new issues. She will have a flu shot today. We discussed switching to a long-acting anticholinergic associated with long-acting beta agonist and skipping inhaled steroidal given recent history of hip fracture but she's not interested to change due to cost issues and because she's currently doing well  Multiple lung nodules: She had CT scan of the chest 08/03/2015: She has multiple lung nodules largest 4 mm. This is all stable since October 2014 completing near 2 year stability. She is currently 87 years of age and therefore will not qualify for future low-dose lung cancer screening CT   OV 04/30/2016  Chief Complaint  Patient presents with   Follow-up    breathing has been doing well.  no concerns.CAT Score: 11    Follow-up moderate COPD he is a 9 month routine follow-up. She is doing well. No urgent care visits. No emergency room visits. No prednisone use. No new medical problems diagnosis. Few [place] the [place] and now she has a scratchy throat but several weeks ago she had a cold and did not flareup in the COPD exacerbation. She wants to keep an eye on her cold. She's not interested in inhaler change. She likes her Symbicort. She has enough refills and will call us if needed. She's got good quality of life.   OV 02/09/2017  Chief Complaint  Patient presents with   Follow-up    Pt states  she feels the Symbicort 160 helps better than the 80, she is currently on the 80 dose. Pt c/o throat tickle, hoarseness, and SOB with over activity.  Pt denies chest congestion, CP/tightness.     Follow-up moderate COPD  Since last visit I dropped her Symbicort to the lower dose. She's had 2 exacerbations since then. She also believes that the lower dose Symbicort is not controlling her COPD as well but she's not fully sure if it is a steroid effect on just an individual variation depending on the season. In December she does not want to go up on the steroid dose to a full dose Symbicort. In the past. Event caused itching so this time she wants to hold off on any anticholinergic but if she continues to feel that lower dose Symbicort is not controlling a COPD well then she'll be open to adding a different anticholinergic other than Spiriva going up on the steroid dose. Otherwise doing well. The spring season though is causing some postnasal drainage and increased cough and ticklish throat.   OV 08/14/2017  Chief Complaint  Patient presents with   Follow-up    Pt states that she has been doing good. Pt is currently on presnisone due to arthritis but has two days left of that. Was a little SOB but the prednisone she is currently on is helping with that.   Follow-up moderate COPD  Last seen April 2018. Normally does 9 month follow-up. But at last visit. Drop the steroid dose and therefore she is here in 6 months. COPD stable no exacerbations. Recently shehad neck spasm and is currently finishing a prednisone taper given by orthopedics and this is actually helping his COPD symptoms. COPD cat score is 7 and only shows mild symptom burden. She wants flu shot. No new issues. Chest x-ray September 2018 per report is clear   OV 05/18/2018  Chief Complaint  Patient presents with   Follow-up    Pt states breathing has been worse with the humidity and hot weather. States she has been having to use  rescue inhaler at least twice a day. Pt also states she has been a little more SOB recently, has an occ. cough with clear mucus, but denies any CP.    Follow-up moderate COPD  This is a routine follow-up. She normally does 9 month follow-ups. Last visit October 2018 periods in the interim she's continued to do well. COPD cat score is 6 and slightly better/same compared to the before. She does daily exercises at [facility] which helps. Recently with humidity she is dealing with some increased postnasal drip for which she takes nasal steroids as needed. In the interim has been no medical issues are ER visits or urgent care visits or prednisone use or new medical diagnoses. Last chest x-ray September 2018 clear      OV 06/14/2020  Subjective:  Patient ID: Kelly Delgado, female , DOB: 02/17/1936 , age 21 y.o. , MRN: [id number] , AD[address]: [address] [place] [id number]   06/14/2020 -   Chief Complaint  Patient presents with   Follow-up    Productive cough with clear phlegm   Follow-up moderate COPD.  HPI Kelly Delgado 87 y.o. -last seen by myself just over 2 years ago in July 2019.  Then in January 2020 she had a COPD exacerbation and seen by nurse practitioner.  Then after that the COVID-19 pandemic struck.  Since then she has been isolating.  We have not been able to see her till today.  She says she is doing well.  She is on Symbicort.  She wants refills.  COPD CAT score is stable.  No interim medical issues no ER visits.  She normally gets vaccines.  However she is reluctant to get the Covid vaccine because of lack of long-term side effects.  She recalls the thalidomide disaster that happened when it was 5 years into the drug and people discovered the side effects.  I acknowledge that we do not have long-term side effect profile on Covid vaccine because of the short duration of the pandemic.  She is really good at social distancing and masking.  We discussed Regeneron monoclonal antibodies  as a way to mitigate her risk in terms of both prophylaxis and treating infection.       OV 03/14/2021  Subjective:  Patient ID: Kelly Delgado, female , DOB: 22-Jun-1936 , age 62 y.o. , MRN: [id number] , AD[address]: [address] [place] [id number] PCP Marda Stalker, PA-C Patient Care Team: Marda Stalker, PA-C as PCP - General (Family Medicine) Brand Males, MD as Consulting Physician (Pulmonary Disease)  This Provider for this visit: Treatment Team:  Attending Provider: Brand Males, MD    03/14/2021 -   Chief Complaint  Patient presents with   Follow-up    Pt states she was doing okay since last visit until about 1 week ago due to having problems from  pollen and states she was having more problems with SOB and also states that she has been coughing which is also keeping her up at night.     HPI Kelly Delgado 87 y.o. -COPD follow-up.  In this visit she says she continues to use her Symbicort.  She brings her husband with her.  She normally exercises at the [facility].  She is not vaccinated against COVID and will not get vaccinated.  She says that she is not coming to close contact with anyone.  No clusters.  Other than when she exercises she is masking.  She avoids people.  However she got exposed to pollen late last week.  Then approximately on Friday, Mar 08, 2021 she worked in the garden with some hanging buds.  Then starting Sunday, Mar 10, 2021 she started getting cough with yellow sputum.  She sounds congested which she says is no sinus drainage.  She feels tired she is more short of breath.  There might be some associated increase in wheezing.  No fever.  The same day she checked her COVID rapid antigen test at home and was negative.  Husband is not sick.  She is not vaccinated against COVID no other sick contacts.  She does not think it is COVID but is willing to get tested with PCR.       OV 07/17/2021  Subjective:  Patient ID: Kelly Delgado, female ,  DOB: 09-04-1936 , age 78 y.o. , MRN: [id number] , ADDRESS: [place] [address] PCP Marda Stalker, PA-C Patient Care Team: Marda Stalker, PA-C as PCP - General (Family Medicine) Brand Males, MD as Consulting Physician (Pulmonary Disease)  This Provider for this visit: Treatment Team:  Attending Provider: Brand Males, MD    07/17/2021 -   Chief Complaint  Patient presents with   Follow-up    Pt states she had covid about 2 weeks ago. States she is tired all the time and feels like she cannot get her strength back. States she has been coughing some and also has had some increased SOB.   Gold stage II COPD on Symbicort   HPI Franceska Sherburn 87 y.o. -she has Gold stage II COPD on Symbicort.  Last visit May 2022.  Had refused COVID-vaccine.  After that couple of for telephone calls for COPD exacerbation treated with prednisone.  Most recently towards the end of August 2022.  Then on 06/27/2021 we did high-resolution CT chest.  A suggestion of early ILD.  Then several days later around Labor Day 2022 developed COVID .  She did not call us for any antiviral.  She rested and then missed her daughter's wedding and then subsequently got better.  Currently having some residual cough fatigue and shortness of breath and occasional white sputum.  Things are getting better but she feels prednisone could help her.  Today the first day out of the house.  No leg swelling.  No hemoptysis.  No fever or chills. CLINICAL DATA:  87 year old female with history of dyspnea on exertion. Increasing shortness of breath since May 2022. Currently on fourth round of prednisone treatment. History of COPD. Evaluate for interstitial lung disease.   EXAM: CT CHEST WITHOUT CONTRAST   TECHNIQUE: Multidetector CT imaging of the chest was performed following the standard protocol without intravenous contrast. High resolution imaging of the lungs, as well as inspiratory and expiratory  imaging, was performed.   COMPARISON:  Chest CT 07/04/2015.   FINDINGS: Cardiovascular: Heart size is borderline enlarged. There  is no significant pericardial fluid, thickening or pericardial calcification. Aortic atherosclerosis. No definite coronary artery calcifications. Mild calcifications of the mitral annulus.   Mediastinum/Nodes: No pathologically enlarged mediastinal or hilar lymph nodes. Please note that accurate exclusion of hilar adenopathy is limited on noncontrast CT scans. Esophagus is unremarkable in appearance. No axillary lymphadenopathy.   Lungs/Pleura: High-resolution images demonstrate areas of mild septal thickening and severe thickening of the peribronchovascular interstitium with some linear areas of architectural distortion and volume loss, most evident in the right middle lobe and inferior segment of the lingula. No significant regions of traction bronchiectasis or honeycombing. Inspiratory and expiratory imaging demonstrates moderate air trapping indicative of small airways disease. No acute consolidative airspace disease. No pleural effusions. Small calcified granulomas are present. No other suspicious appearing pulmonary nodules or masses are noted.   Upper Abdomen: Aortic atherosclerosis.   Musculoskeletal: There are no aggressive appearing lytic or blastic lesions noted in the visualized portions of the skeleton.   IMPRESSION: 1. The appearance of the lungs may suggest very early or mild interstitial lung disease, with a spectrum of findings considered indeterminate for usual interstitial pneumonia (UIP) per current ATS guidelines, as detailed above. If there is persistent clinical concern for interstitial lung disease, repeat high-resolution chest CT would be recommended in 12 months to assess for temporal changes in the appearance of the lung parenchyma. 2. Aortic atherosclerosis.   Aortic Atherosclerosis (ICD10-I70.0).     Electronically  Signed   By: Vinnie Langton M.D.   On: 06/27/2021 15:40      OV 08/30/2021  Subjective:  Patient ID: Kelly Delgado, female , DOB: [birth year] , age 73 y.o. , MRN: [id number] , ADDRESS: [place] [address] PCP Marda Stalker, PA-C Patient Care Team: Marda Stalker, PA-C as PCP - General (Family Medicine) Brand Males, MD as Consulting Physician (Pulmonary Disease)  This Provider for this visit: Treatment Team:  Attending Provider: Brand Males, MD    08/30/2021 -   Chief Complaint  Patient presents with   Follow-up    Patient has not been feeling good for the last 2 weeks. She called 911 this morning for her breathing and they gave her a breathing treatment. Lots of wheezing, increased shortness of breath. Productive Cough with yellow sputum    Gold stage II COPD on Symbicort  Possible early ILD on CT scan of the chest early September September 2022 pre-COVID COVID  Labor Day 2022 history of outpatient COVID  Multiple COPD exacerbations in 2022 As of HPI Kelly Delgado 87 y.o. -last visit 17 July 2021.  At that time we gave her prednisone 5 days.  Then around August 05, 2021 she called and we had to give another prednisone.  Because of repeated COPD exacerbations did IgE this was normal.  Her blood eosinophils was high at 1200 cells per cubic millimeter.  (In 2014 it was 600 cells but then subsequently became 0 cells].  Her rheumatoid factor is slightly positive.  Rest of the autoimmune is normal.  She now tells me that the last that she finished prednisone taper she started on cholesterol medication ZETIA.Marland Kitchen  She is not sure of the exact date but she suspects this was around a few weeks ago.  Then 3 days into it she started feeling short of breath.  She stopped it but she feels respiratory exacerbation is worse she feels is because of his diarrhea.  She is continue to wheeze and have chest tightness and cough no sputum production.  She  called 911 today.  They came to home and gave albuterol nebulizer.  They did not take her to the ER.  She decided to keep this appointment.  She has upcoming PFT appointment and clinic visit in a couple weeks.  She is really frustrated with repeated exacerbations.  She is wondering what is going on.  Today there is audible wheezing but she was not using accessory muscles.  Her PFT appointment is pending.  COPD CAT score shows significant deterioration.  See below.   OV 09/12/2021  Subjective:  Patient ID: Kelly Delgado, female , DOB: 05-27-1936 , age 49 y.o. , MRN: [id number] , ADDRESS: [place] [address] PCP Marda Stalker, PA-C Patient Care Team: Marda Stalker, PA-C as PCP - General (Family Medicine) Brand Males, MD as Consulting Physician (Pulmonary Disease)  This Provider for this visit: Treatment Team:  Attending Provider: Brand Males, MD   09/12/2021 -   Chief Complaint  Patient presents with   Follow-up    Kelly Delgado is working better     HPI Kelly Delgado 87 y.o. -returns for follow-up.  Seen earlier in the month for COPD exacerbation and repeated exacerbations.  Given prednisone and also given Breztri.  We are looking for other causes for multiple repeated exacerbations.  I wanted RAST allergy panel to be done 2 weeks after she finishes prednisone.  She only prednisone prednisone a few days ago.  She is going to wait and get it.  After the recent steroid burst she is feeling better.  She did have repeat pulmonary function testing it shows a slight decline compared to 2016.  Nevertheless she is feeling better with the [facility].  She is beginning to go back to the [facility] and start exercising.  She is now walking half a mile [baseline 2 miles].  She is okay with approach of watchful waiting at this point.  She did a walking desaturation test and did not desaturate.  She walked 185 feet x 3 laps in the office.    CT Chest data  No results  found.   02/25/2022: OV with Groce,NP for follow-up.  She was treated for a flareup in January with doxycycline and prednisone course.  She had good response to treatment.  She was then diagnosed with breast cancer in February.  At [place], reported that she has been doing okay.  She does feel like she has some increased wheezing and shortness of breath which she has been having to use her rescue inhaler more for.  Feels like this is related to allergies.  Denies any fever and secretions are clear.  Treated with prednisone taper.  Continued Flonase for allergies and advised adding on Claritin or other over-the-counter antihistamine for allergies.  Recommended to call if she had any changes in her secretions.  Continued on triple therapy with Breztri and as needed albuterol.  04/24/2022: OV with Cobb NP for acute visit.  She reports that she developed increased cough and shortness of breath at the beginning of the week.  She has also been wheezing more.  Her cough is productive with yellow sputum.  She has been using her nebs a couple times a day, which do help with her breathing and wheezing  She denies any fevers, night sweats, hemoptysis, anorexia, weight loss, lower extremity edema.  She continues on [place] and has been using her albuterol more recently.  She recently came off tamoxifen.  She had been having ongoing issues while being on it including fatigue  and depressed mood.  She then developed a rash and decided she did not want to be on any longer.  She has plans to follow-up with oncology in August to determine next steps. Treated for AECOPD with empiric doxy and prednisone taper. Continue Breztri and prn albuterol. CXR without superimposed infection/acute process.   05/08/2022: Today - follow up Patient presents today for follow up after being treated for AECOPD with prednisone taper and doxy course. She reports feeling significantly better today. Feels like the medications really helped her. Her breathing  has returned to her baseline and her cough is minimal, non-productive. She is ready to get back to the gym and start exercising regularly again. She denies any fevers, hemoptysis, night sweats, wheezing, weight loss. She still has been fighting the rash on the back of her head and on her upper back. It was originally thought to be related to Tamoxifen and/or possibly fungal in nature. She has since stopped Tamoxifen. Her oncologist prescribed ketoconazole shampoo, which she felt made the rash worse. She told me today that the rash completely went away when she was on the doxy and prednisone. It returned about 2-3 days later. She describes it as red and itchy, occasionally is flaky. Denies any exudate, warmth or open lesions.   OV 08/15/2022  Subjective:  Patient ID: Kelly Delgado, female , DOB: 14-Apr-1936 , age 40 y.o. , MRN: [id number] , ADDRESS: [place] [address] PCP Marda Stalker, PA-C Patient Care Team: Marda Stalker, PA-C as PCP - General (Family Medicine) Brand Males, MD as Consulting Physician (Pulmonary Disease)  This Provider for this visit: Treatment Team:  Attending Provider: Brand Males, MD    08/15/2022 -   Chief Complaint  Patient presents with   Follow-up    PFT performed today. Pt states she has been doing well since last visit and denies any complaints.     HPI Kelly Delgado 86 y.o. -returns for follow-up.  At this point in time she is on triple inhaler therapy with albuterol as needed.  She has no longer had COPD exacerbation since summer 2023.  She says that because of stress and other issues such as breast cancer in February 2023 and a fall in May 2023 she was having frequent flareups.  She says since then she has learned to control her stress and the COPD exacerbation is better.  A year ago she had blood eosinophils that was significantly elevated.  She is agreeable to check it but we discussed options of Roflumilast versus  enrolling in a research study versus standard of care Dupixent therapy.  She prefers to maintain the status quo.  She is suspected to have ILD.  I personally visualized the CT chest from October 2023.  She has interstitial lung abnormalities.  Her pulmonary function test and CT scan that I personally visualized are all stable.  We discussed her respiratory vaccines and she does not want to take any.    OV 12/30/2022  Subjective:  Patient ID: Kelly Delgado, female , DOB: 1936/10/21 , age 39 y.o. , MRN: [id number] , ADDRESS: [place] [address] PCP Marda Stalker, PA-C Patient Care Team: Marda Stalker, PA-C as PCP - General (Family Medicine) Brand Males, MD as Consulting Physician (Pulmonary Disease)  This Provider for this visit: Treatment Team:  Attending Provider: Brand Males, MD   Gold stage II COPD on Symbicort  -Although March 2024 CT scan close normal. Multiple COPD exacerbations  - eos 600  in oct 2023  ILA v Possible early ILD on CT scan of the chest early September September 2022 pre-COVID COVID  - Labor Day 2022 history of outpatient COVID   Borderlne positive RF - 47 in Oct 22 (other serology negative)  NEg [place] Gold Oct 2022  12/30/2022 -   Chief Complaint  Patient presents with   Follow-up    PFT     HPI Kelly Delgado 86 y.o. -returns for 5-24-monthfollow-up.  She continues to do well.  No flareups and COPD since going on Breztri and also the summer 2023.  She attributes this to a lower level of stress in her life after the breast cancer surgery.  She believes the exacerbations was related to the fall and breast cancer issues.  Currently [facility]  Then she takes a brief pause and is increased herself to 1 mile exertion.  The current symptom scores are listed below.  She does not want to do any respiratory vaccines.  She did have pulmonary function test today and is essentially  close to normal.  No new emergency room visits.  No hospitalizations.  No big medication changes.   SYMPTOM SCALE - 12/30/2022  Current weight   O2 use ra  Shortness of Breath 0 -> 5 scale with 5 being worst (score 6 If unable to do)  At rest 0  Simple tasks - showers, clothes change, eating, shaving 3  Household (dishes, doing bed, laundry) 4  Shopping 4  Walking level at own pace 2  Walking up Stairs 4  Total (30-36) Dyspnea Score 15  How bad is your cough? 0  How bad is your fatigue 2  How bad is nausea 0  How bad is vomiting?  0  How bad is diarrhea? 0  How bad is anxiety? 0  How bad is depression 0  Any chronic pain - if so where and how bad 0       CT Chest data - HRCT 07/28/22   Narrative & Impression  CLINICAL DATA:  87year old female with history of shortness of breath and dyspnea on exertion. Pulmonary nodule. History of right-sided breast cancer status post lumpectomy. * Tracking Code: BO *   EXAM: CT CHEST WITHOUT CONTRAST   TECHNIQUE: Multidetector CT imaging of the chest was performed following the standard protocol without intravenous contrast. High resolution imaging of the lungs, as well as inspiratory and expiratory imaging, was performed.   RADIATION DOSE REDUCTION: This exam was performed according to the departmental dose-optimization program which includes automated exposure control, adjustment of the mA and/or kV according to patient size and/or use of iterative reconstruction technique.   COMPARISON:  High-resolution chest CT 06/27/2021.   FINDINGS: Cardiovascular: Heart size is normal. There is no significant pericardial fluid, thickening or pericardial calcification. Atherosclerotic calcifications in the thoracic aorta. No definite coronary artery calcifications. Mild calcifications of the mitral annulus.   Mediastinum/Nodes: No pathologically enlarged mediastinal or hilar lymph nodes. Please note that accurate exclusion of hilar  adenopathy is limited on noncontrast CT scans. Esophagus is unremarkable in appearance. No axillary lymphadenopathy.   Lungs/Pleura: High-resolution images demonstrate minimal septal thickening, thickening of the peribronchovascular interstitium and regional areas of architectural distortion in the lung bases bilaterally, stable compared to the prior study. No subpleural reticulation, traction bronchiectasis or honeycombing is noted. Inspiratory and expiratory imaging demonstrates moderate air trapping indicative of small airways disease. No acute consolidative airspace disease. No pleural effusions.  3 mm pulmonary nodule in the periphery of the right lower lobe (axial image 87 of series 8), unchanged in retrospect compared to the prior study. No other definite suspicious appearing pulmonary nodules or masses are noted.   Upper Abdomen: Aortic atherosclerosis.   Musculoskeletal: There are no aggressive appearing lytic or blastic lesions noted in the visualized portions of the skeleton.   IMPRESSION: 1. Overall, the appearance of the lungs is stable compared to the prior study, with a spectrum of findings that may simply represent areas of mild chronic post infectious or inflammatory scarring. Findings are once again categorized as indeterminate for usual interstitial pneumonia (UIP) per current ATS guidelines, however, given the stability of the findings and the overall appearance, interstitial lung disease is not excluded, but not strongly favored. 2. Aortic atherosclerosis.   Aortic Atherosclerosis (ICD10-I70.0).     Electronically Signed   By: Vinnie Langton M.D.   On: 07/29/2022 08:45      No results found.   Latest Reference Range & Units 08/08/07 10:25 06/07/13 03:24 09/09/13 07:41 10/08/14 18:50 11/24/16 10:25 08/06/21 09:29 08/15/22 13:26  Eosinophils Absolute 0.0 - 0.7 K/uL 0.1 0.0 0.6 0.7 0.0 1.2 (H) 0.6  (H): Data is abnormally high  PFT     Latest Ref  Rng & Units 08/15/2022   11:55 AM 09/12/2021   10:53 AM 07/23/2015    9:49 AM  PFT Results  FVC-Pre L 2.11  1.92  2.51   FVC-Predicted Pre % 75  64  77   FVC-Post L 2.20  2.28  2.55   FVC-Predicted Post % 78  76  79   Pre FEV1/FVC % % 72  68  74   Post FEV1/FCV % % 78  75  76   FEV1-Pre L 1.52  1.31  1.85   FEV1-Predicted Pre % 72  58  76   FEV1-Post L 1.71  1.71  1.93   DLCO uncorrected ml/min/mmHg 15.30  17.02    DLCO UNC% % 74  80    DLCO corrected ml/min/mmHg 15.30  16.92    DLCO COR %Predicted % 74  79    DLVA Predicted % 96  122    TLC L 4.62  4.51    TLC % Predicted % 83  79    RV % Predicted % 97  100         has a past medical history of Anxiety, Asthmatic bronchitis, Basal cell carcinoma of breast (1990's?), Breast cancer (HCC) (11/05/2021), COPD (chronic obstructive pulmonary disease) ([place]), GERD (gastroesophageal reflux disease), H/O hiatal hernia, Hypertension, 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   reports that she quit smoking about 54 years ago. Her smoking use included cigarettes. She has a 4.29 pack-year smoking history. She has never used smokeless tobacco.  Past Surgical History:  Procedure Laterality Date   ABDOMINAL HYSTERECTOMY  06/28/1979   BREAST BIOPSY Right 11/05/2021   BREAST LUMPECTOMY WITH RADIOACTIVE SEED LOCALIZATION Right 12/05/2021   Procedure: RIGHT BREAST LUMPECTOMY WITH RADIOACTIVE SEED LOCALIZATION;  Surgeon: Donnie Mesa, MD;  Location: [facility];  Service: General;  Laterality: Right;   CATARACT EXTRACTION W/ INTRAOCULAR LENS  IMPLANT, BILATERAL Bilateral 06/27/1989   DILATION AND CURETTAGE OF UTERUS  06/28/1979   "1" (06/07/2013)   ESOPHAGEAL DILATION  10/27/2010   "just once" (06/07/2013)   HIP PINNING,CANNULATED Right 10/09/2014   Procedure: CANNULATED HIP PINNING;  Surgeon: Renette Butters, MD;  Location[facility]  OR;  Service: Orthopedics;  Laterality: Right;    URETHRAL DIVERTICULUM REPAIR  06/27/1969    Allergies  Allergen Reactions   Ezetimibe Shortness Of Breath   Statins Other (See Comments)    Increased LFT's   Alendronate Sodium     felt sick   Ibandronic Acid     felt sick   Spiriva [Tiotropium Bromide Monohydrate] Itching    Immunization History  Administered Date(s) Administered   Influenza Split 06/26/2009, 09/05/2010, 07/17/2014, 07/23/2015, 08/14/2017   Influenza Whole 06/27/2012   Influenza, High Dose Seasonal PF 08/25/2012, 08/11/2016, 08/14/2017   Influenza,inj,Quad PF,6+ Mos 07/17/2014, 07/23/2015, 07/27/2016, 11/01/2018   Pneumococcal Conjugate-13 07/17/2014   Pneumococcal Polysaccharide-23 12/12/2008, 06/27/2012   Pneumococcal-Unspecified 06/27/2012   Tdap 06/29/2006, 02/07/2015    Family History  Problem Relation Age of Onset   Cancer Mother    Stroke Mother    Hypertension Mother    Prostate cancer Father    Cancer Maternal Aunt        unknown type; dx after 5   Leukemia Maternal Uncle    Breast cancer Cousin        maternal female cousin; dx 71s   Breast cancer Cousin        maternal female cousin; dx 45s   Breast cancer Half-Brother 43     Current Outpatient Medications:    albuterol (PROVENTIL) (2.5 MG/3ML) 0.083% nebulizer solution, TAKE 3 MLS BY NEBULIZER EVERY6 HOURS AS NEEDED FOR WHEEZING OR SHORTNESS OF BREATH, Disp: 75 mL, Rfl: 6   albuterol (VENTOLIN HFA) 108 (90 Base) MCG/ACT inhaler, INHALE 2 PUFFS INTO LUNGS EVERY 6 HRS AS NEEDED FOR WHEEZING/SHORTNESS OF BREATH *NEEDS APPOINTMENT, Disp: 18 each, Rfl: 3   Ascorbic Acid (VITAMIN C PO), Take 1 tablet by mouth daily., Disp: , Rfl:    BREZTRI AEROSPHERE 160-9-4.8 MCG/ACT AERO, INHALE 2 PUFFS INTO THE LUNGS IN THE MORNING AND AT BEDTIME., Disp: 10.7 each, Rfl: 5   Calcium Carb-Cholecalciferol (CALCIUM 600 + D PO), Take 1 tablet by mouth every other day. , Disp: , Rfl:    Cholecalciferol (VITAMIN D-3 PO), Take 1 capsule by mouth daily., Disp:  , Rfl:    fluticasone (FLONASE) 50 MCG/ACT nasal spray, Place 1 spray into both nostrils daily as needed for allergies or rhinitis., Disp: , Rfl:    hydrochlorothiazide (HYDRODIURIL) 25 MG tablet, Take 25 mg by mouth daily as needed (swelling)., Disp: , Rfl:    Multiple Vitamin (MULTIVITAMIN WITH MINERALS) TABS tablet, Take 1 tablet by mouth daily., Disp: , Rfl:       Objective:   Vitals:   12/30/22 1524  BP: 128/78  Pulse: 74  SpO2: 97%  Weight: 182 lb 9.6 oz (82.8 kg)  Height: '5\' 8"'$  (1.727 m)    Estimated body mass index is 27.76 kg/m as calculated from the following:   Height as of this encounter: '5\' 8"'$  (1.727 m).   Weight as of this encounter: 182 lb 9.6 oz (82.8 kg).  '@WEIGHTCHANGE'$ @  [employer]   12/30/22 1524  Weight: 182 lb 9.6 oz (82.8 kg)     Physical Exam  General: No distress. Looks well Neuro: Alert and Oriented x 3. GCS 15. Speech normal Psych: Pleasant Resp:  Barrel Chest - no.  Wheeze - no, Crackles - no, No overt respiratory distress CVS: Normal heart sounds. Murmurs - no Ext: Stigmata of Connective Tissue Disease - no HEENT: Normal upper airway. PEERL +. No post nasal drip  Assessment:       ICD-10-CM   1. Stage 2 moderate COPD by GOLD classification ([place])  J44.9     2. COPD, frequent exacerbations ([place])  J44.1     3. Eosinophilia, unspecified type  D72.10     4. Pulmonary infiltrates  R91.8          Plan:     Patient Instructions  Stage 2 moderate COPD by GOLD classification (HCC) COPD, frequent exacerbations (HCC) Eosinophilia, unspecified type  - current stabl diseae -You have had a recent history in the last 1-2 years of frequent flareups which could be driven due to stress or inflammatory cells such as eosinophils in the lung - But glad no flare up snce summer 2023 with BREZTRI and improved stress and exercise  Plan  -  continue breztril scheduled with albuterol as need  Pulmonary infiltrates -concerning for  interstitial lung abnormalities  -October 2023 pulmonary CT scan stable x 1 yer - PFT normal March 2024  Plan  - Continued observation -Repeat spirometry and DLCO in 12 months - will not do another CT chest unless clinically indicated   Followup -6 months or sooner if needed    SIGNATURE    Dr. Brand Males, M.D., F.C.C.P,  Pulmonary and Critical Care Medicine Staff Physician, [facility] Director - Interstitial Lung Disease  Program  [facility] [place], [place], [address]  Pager: [phone], If no answer or between  15:00h - 7:00h: call [phone] Telephone: [phone]  3:45 PM 12/30/2022

## 2022-12-30 NOTE — Patient Instructions (Signed)
Spiro/DLCO performed today.  

## 2022-12-30 NOTE — Patient Instructions (Addendum)
Stage 2 moderate COPD by GOLD classification (HCC) COPD, frequent exacerbations (HCC) Eosinophilia, unspecified type  - current stabl diseae -You have had a recent history in the last 1-2 years of frequent flareups which could be driven due to stress or inflammatory cells such as eosinophils in the lung - But glad no flare up snce summer 2023 with BREZTRI and improved stress and exercise  Plan  -  continue breztril scheduled with albuterol as need - can look at weaning at followup  Pulmonary infiltrates -concerning for interstitial lung abnormalities  -October 2023 pulmonary CT scan stable x 1 yer - PFT normal March 2024  Plan  - Continued observation -Repeat spirometry and DLCO in 12 months - will not do another CT chest unless clinically indicated   Followup -6 months or sooner if needed

## 2023-01-05 DIAGNOSIS — H353221 Exudative age-related macular degeneration, left eye, with active choroidal neovascularization: Secondary | ICD-10-CM | POA: Diagnosis not present

## 2023-01-05 DIAGNOSIS — H353113 Nonexudative age-related macular degeneration, right eye, advanced atrophic without subfoveal involvement: Secondary | ICD-10-CM | POA: Diagnosis not present

## 2023-01-06 DIAGNOSIS — H353221 Exudative age-related macular degeneration, left eye, with active choroidal neovascularization: Secondary | ICD-10-CM | POA: Diagnosis not present

## 2023-01-09 ENCOUNTER — Other Ambulatory Visit: Payer: Self-pay | Admitting: *Deleted

## 2023-01-09 MED ORDER — BREZTRI AEROSPHERE 160-9-4.8 MCG/ACT IN AERO
2.0000 | INHALATION_SPRAY | Freq: Two times a day (BID) | RESPIRATORY_TRACT | 3 refills | Status: DC
Start: 2023-01-09 — End: 2024-02-15

## 2023-01-22 LAB — PULMONARY FUNCTION TEST
DL/VA % pred: 109 %
DL/VA: 4.32 ml/min/mmHg/L
DLCO cor % pred: 82 %
DLCO cor: 16.94 ml/min/mmHg
DLCO unc % pred: 82 %
DLCO unc: 16.94 ml/min/mmHg
FEF 25-75 Pre: 1.34 L/sec
FEF2575-%Pred-Pre: 97 %
FEV1-%Pred-Pre: 80 %
FEV1-Pre: 1.68 L
FEV1FVC-%Pred-Pre: 105 %
FEV6-%Pred-Pre: 82 %
FEV6-Pre: 2.18 L
FEV6FVC-%Pred-Pre: 105 %
FVC-%Pred-Pre: 77 %
FVC-Pre: 2.18 L
Pre FEV1/FVC ratio: 77 %
Pre FEV6/FVC Ratio: 100 %

## 2023-02-04 DIAGNOSIS — H353221 Exudative age-related macular degeneration, left eye, with active choroidal neovascularization: Secondary | ICD-10-CM | POA: Diagnosis not present

## 2023-03-04 DIAGNOSIS — H353221 Exudative age-related macular degeneration, left eye, with active choroidal neovascularization: Secondary | ICD-10-CM | POA: Diagnosis not present

## 2023-03-11 DIAGNOSIS — K219 Gastro-esophageal reflux disease without esophagitis: Secondary | ICD-10-CM | POA: Diagnosis not present

## 2023-03-11 DIAGNOSIS — J449 Chronic obstructive pulmonary disease, unspecified: Secondary | ICD-10-CM | POA: Diagnosis not present

## 2023-03-11 DIAGNOSIS — M810 Age-related osteoporosis without current pathological fracture: Secondary | ICD-10-CM | POA: Diagnosis not present

## 2023-03-11 DIAGNOSIS — E785 Hyperlipidemia, unspecified: Secondary | ICD-10-CM | POA: Diagnosis not present

## 2023-03-11 DIAGNOSIS — I10 Essential (primary) hypertension: Secondary | ICD-10-CM | POA: Diagnosis not present

## 2023-03-29 IMAGING — MG MM DIGITAL DIAGNOSTIC UNILAT*R* W/ TOMO W/ CAD
8 series · 8 of 20 positions shown · non-contrast
Comparison: Previous exam(s).

CLINICAL DATA: 85-year-old female for further evaluation of
possible RIGHT breast asymmetry and new RIGHT breast calcifications
on screening mammogram.

EXAM:
DIGITAL DIAGNOSTIC UNILATERAL RIGHT MAMMOGRAM WITH TOMOSYNTHESIS AND
CAD; ULTRASOUND RIGHT BREAST LIMITED
TECHNIQUE: Right digital diagnostic mammography and breast tomosynthesis was
performed. The images were evaluated with computer-aided detection.;
Targeted ultrasound examination of the right breast was performed

[R ML]
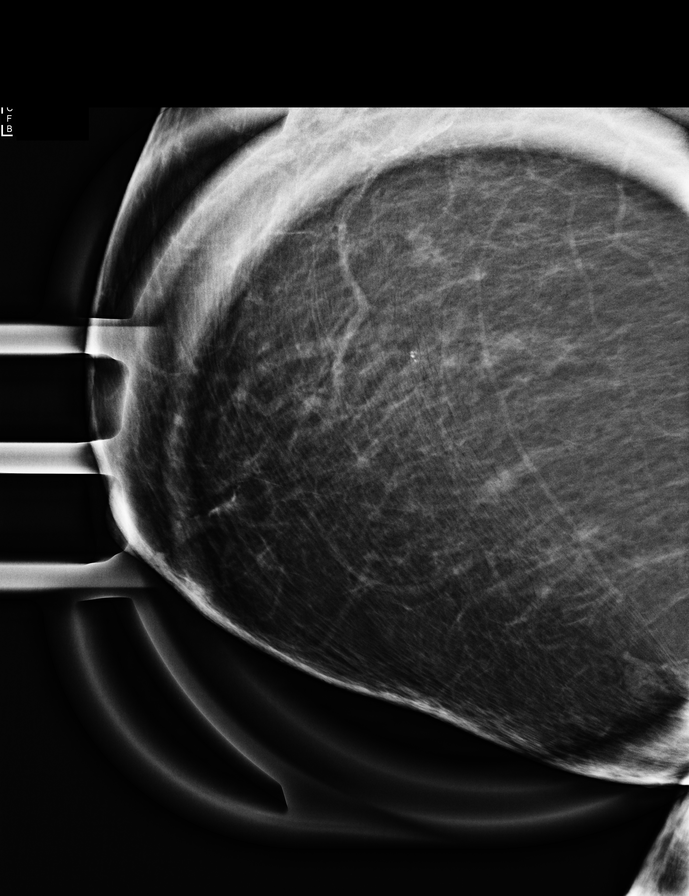

[R CC]
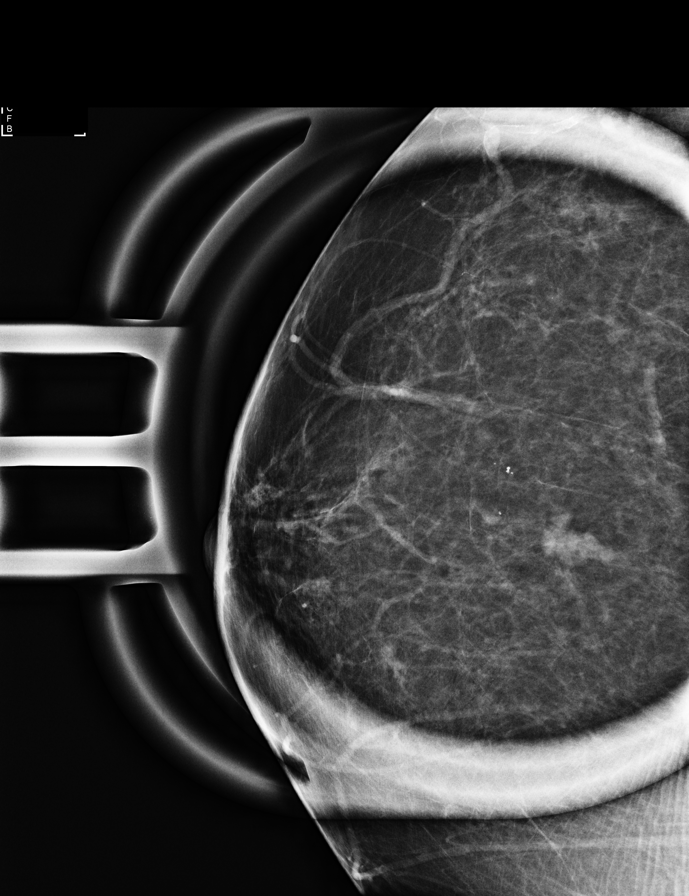

[R MLO synth-2D]
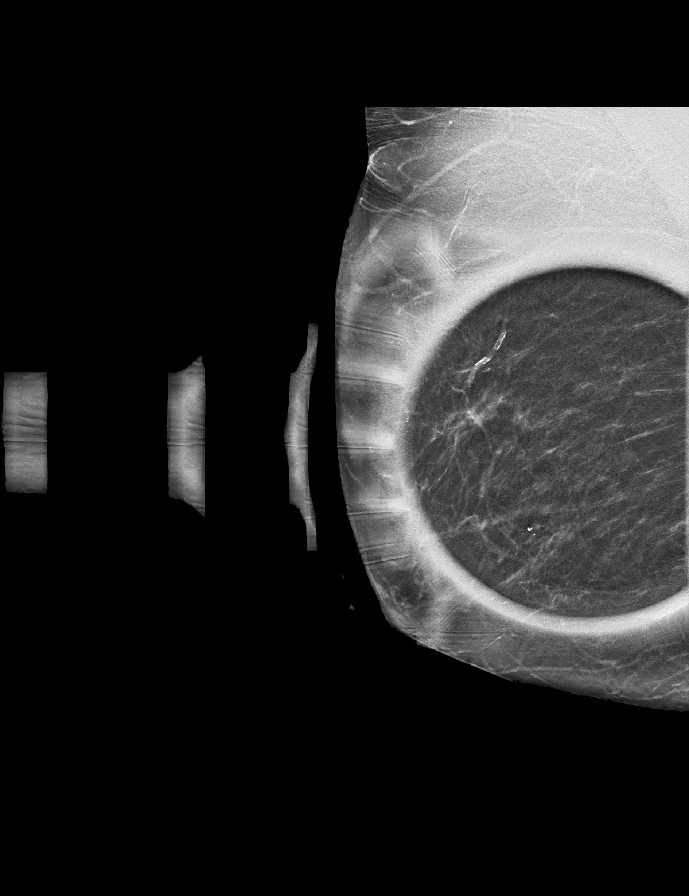

[R CC synth-2D]
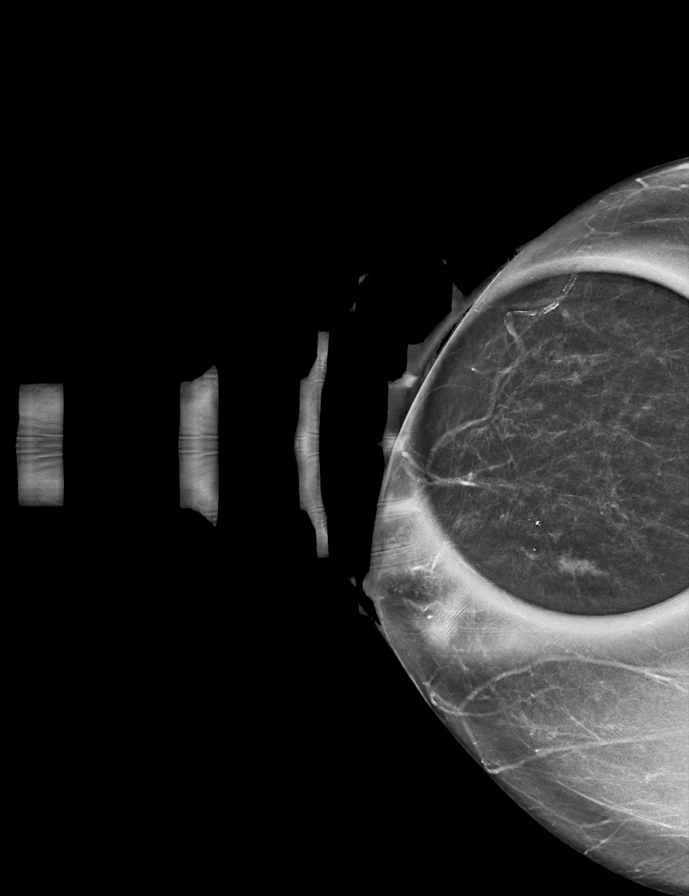

[R ML synth-2D]
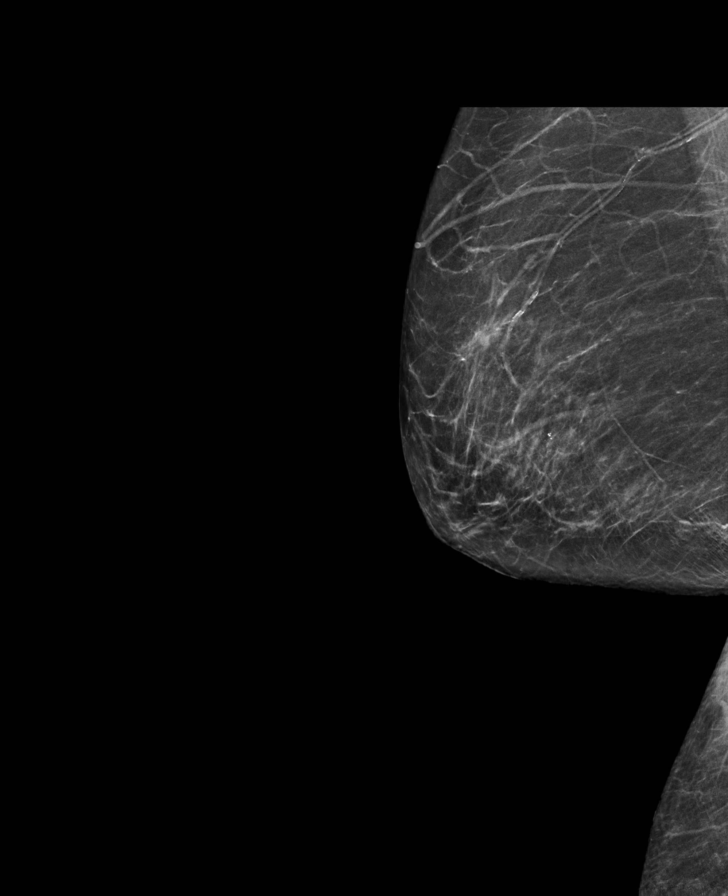

[R MLO tomo · tomo slice 28/55.0]
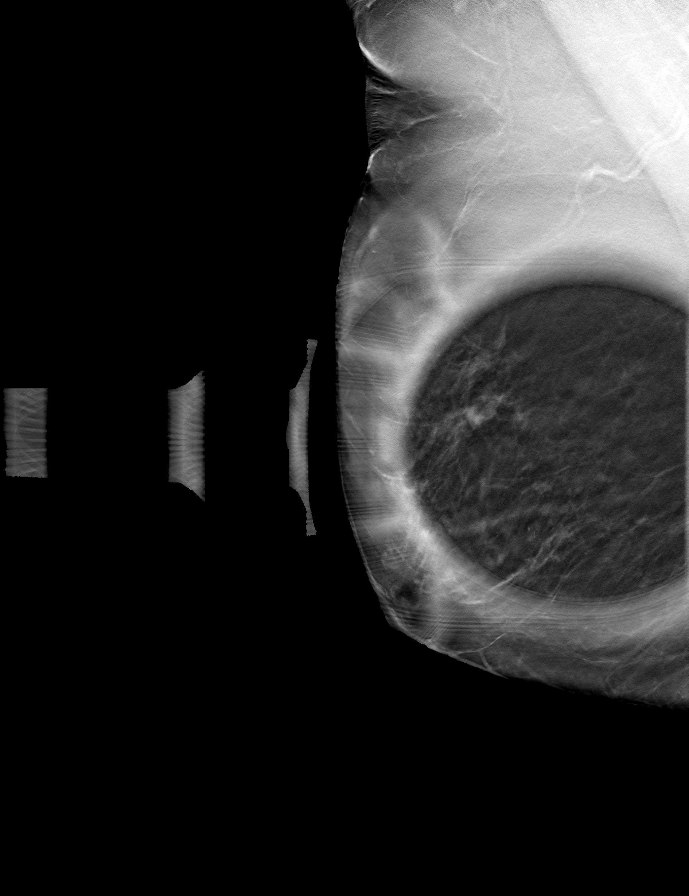

[R ML tomo · tomo slice 33/66.0]
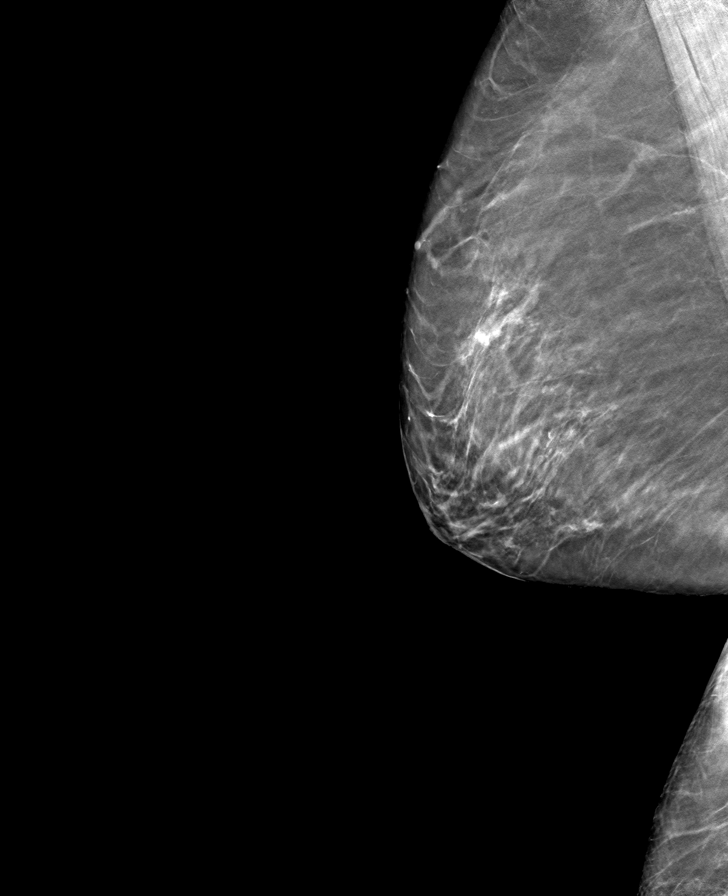

[R CC tomo · tomo slice 23/46.0]
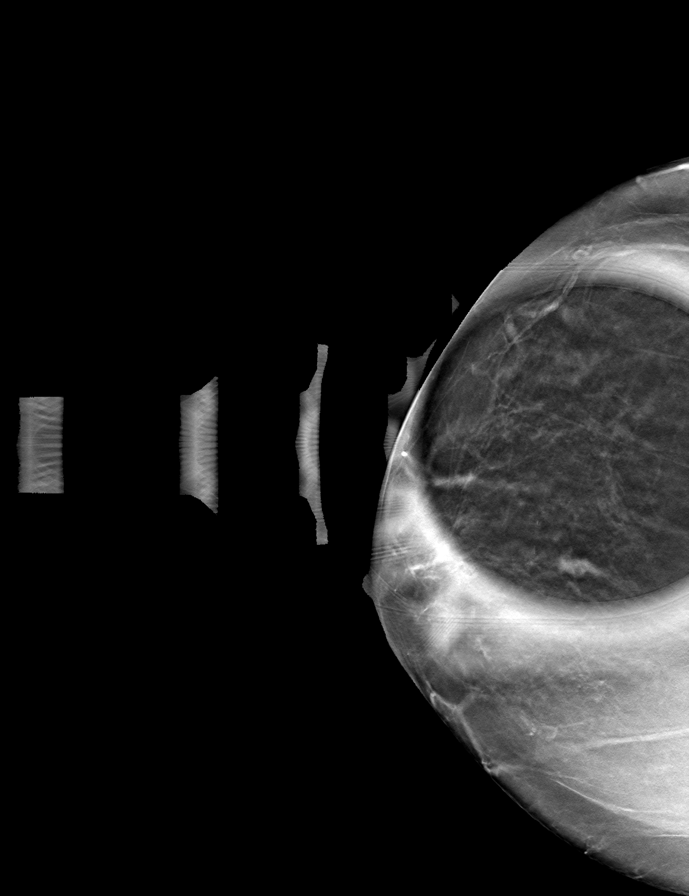

[8 of 20 positions shown; findings below may reference images not displayed]

ACR Breast Density Category b: There are scattered areas of
fibroglandular density.
FINDINGS: Full field, spot compression and magnification views of the RIGHT
breast are performed.

A persistent focal asymmetry is identified within the UPPER RIGHT
breast, middle depth.

A 0.7 cm loose group of 4-5 coarse calcifications is identified
within the slightly UPPER OUTER RIGHT breast.

Targeted ultrasound of the RIGHT breast is performed, showing a
x 0.5 x 1.1 cm partially circumscribed partially indistinct
heterogeneous mass with possible cystic spaces at the 12 o'clock
position of the RIGHT breast 5 cm from the nipple, corresponding to
the mammographic asymmetry.

No abnormal appearing RIGHT axillary lymph nodes are noted.
IMPRESSION: 1. Indeterminate 1.1 cm UPPER RIGHT breast mass. Tissue sampling is
recommended.
2. 0.7 cm group of likely benign UPPER-OUTER RIGHT breast
calcifications. If biopsy of the UPPER RIGHT breast mass
demonstrates atypia or malignancy, then 3D/stereotactic biopsy of
these calcifications is recommended as these are new. Otherwise
six-month follow-up is recommended.
3. No abnormal appearing RIGHT axillary lymph nodes.

RECOMMENDATION:
Ultrasound-guided biopsy of UPPER RIGHT breast mass, which will be
scheduled.

RIGHT diagnostic mammogram with magnification views in 6 months of
UPPER-OUTER RIGHT breast calcifications. If UPPER RIGHT breast mass
biopsy demonstrates atypia or malignancy, then 3D/stereotactic
biopsy of these calcifications would be recommended.

I have discussed the findings and recommendations with the patient.
If applicable, a reminder letter will be sent to the patient
regarding the next appointment.

BI-RADS CATEGORY  4: Suspicious.

## 2023-04-22 DIAGNOSIS — H353221 Exudative age-related macular degeneration, left eye, with active choroidal neovascularization: Secondary | ICD-10-CM | POA: Diagnosis not present

## 2023-05-09 IMAGING — MG MM BREAST SURGICAL SPECIMEN
1 series · 1 of 1 positions shown · non-contrast
Comparison: Previous exam(s).

CLINICAL DATA: 85-year-old with biopsy-proven grade 2 invasive
ductal carcinoma with extracellular mucin associated with the ribbon
shaped tissue marking clip. Radioactive seed localization was
performed yesterday in anticipation of today's lumpectomy.

EXAM:
SPECIMEN RADIOGRAPH OF THE RIGHT BREAST

[R]
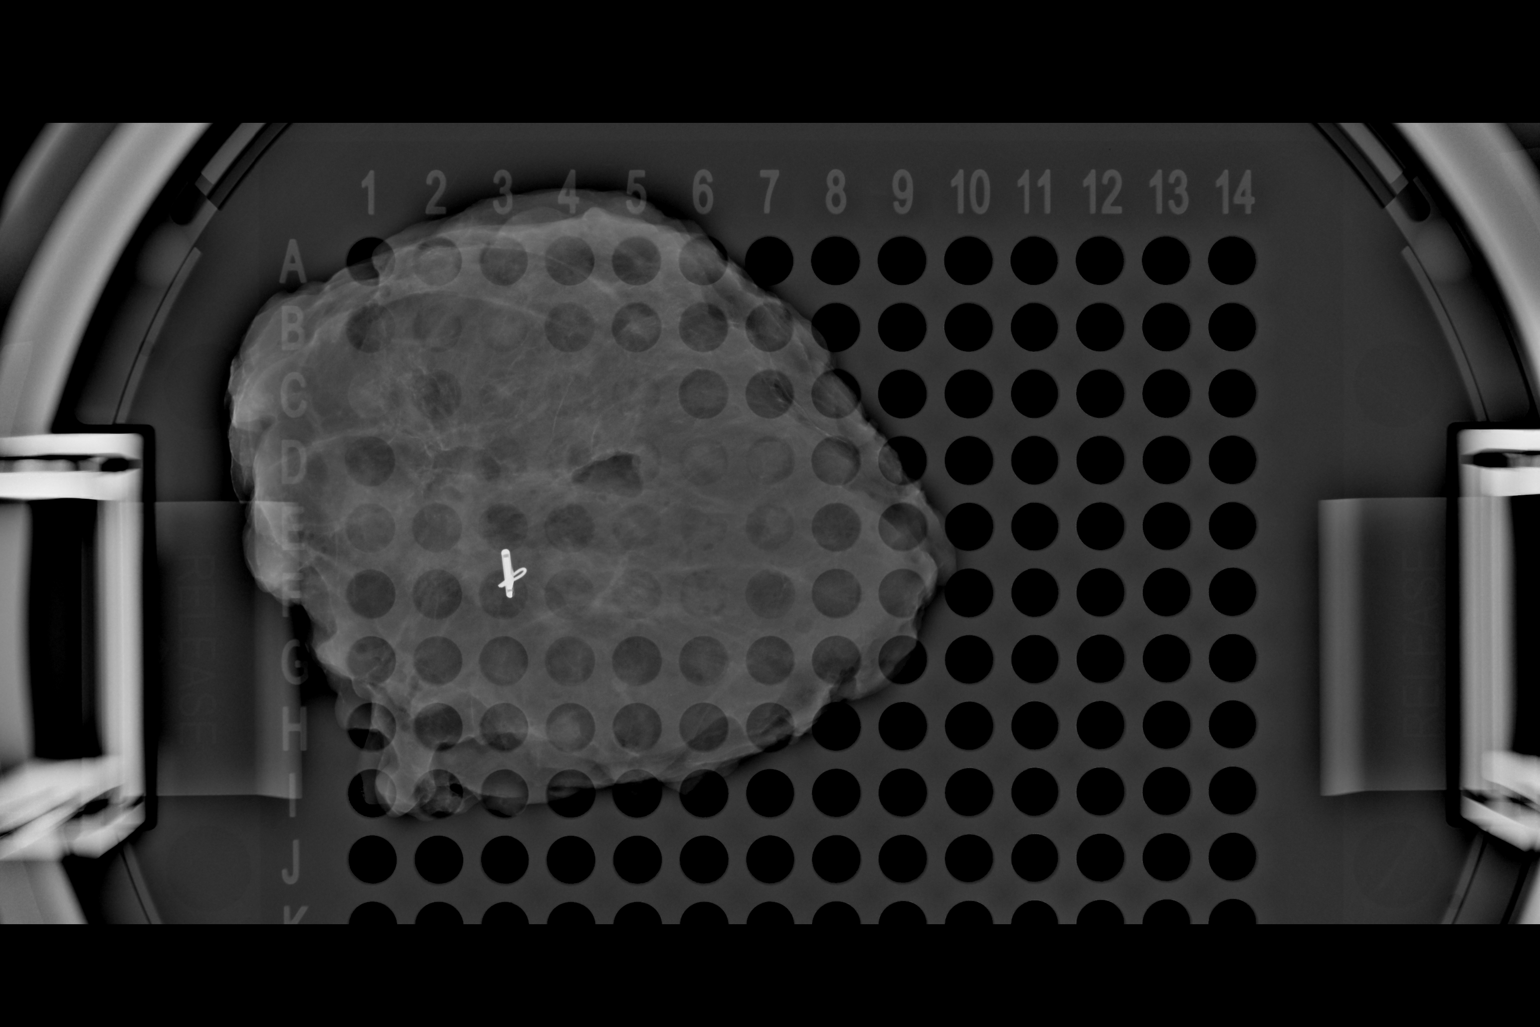

[1 of 1 positions shown; findings below may reference images not displayed]

FINDINGS: Status post excision of the RIGHT breast. The radioactive seed and
the ribbon shaped biopsy marker clip are present, completely intact,
and are marked for pathology. This was discussed with the operating
room nurse by telephone on 12/05/2021 at [DATE] a.m.
IMPRESSION: Specimen radiograph of the RIGHT breast.

## 2023-06-02 ENCOUNTER — Ambulatory Visit: Payer: Medicare Other | Admitting: Hematology and Oncology

## 2023-06-10 DIAGNOSIS — H353221 Exudative age-related macular degeneration, left eye, with active choroidal neovascularization: Secondary | ICD-10-CM | POA: Diagnosis not present

## 2023-07-29 NOTE — Progress Notes (Unsigned)
[facility] CONSULT NOTE  Patient Care Team: Jarrett Soho, PA-C as PCP - General (Family Medicine) Kalman Shan, MD as Consulting Physician (Pulmonary Disease)  CHIEF COMPLAINTS/PURPOSE OF CONSULTATION:  Newly diagnosed breast cancer  HISTORY OF PRESENTING ILLNESS:   Kelly Delgado 87 y.o. female is here because of recent diagnosis of right breast IDC I reviewed her records extensively and collaborated the history with the patient.  SUMMARY OF ONCOLOGIC HISTORY: Oncology History  Malignant neoplasm of upper-outer quadrant of right breast in female, estrogen receptor positive (HCC)  11/25/2021 Initial Diagnosis   Malignant neoplasm of upper-outer quadrant of right breast in female, estrogen receptor positive (HCC)   11/27/2021 Cancer Staging   Staging form: Breast, AJCC 8th Edition - Clinical stage from 11/27/2021: Stage IA (cT1c, cN0, cM0, G2, ER+, PR+, HER2-) - Signed by Ronny Bacon, PA-C on 11/27/2021 Stage prefix: Initial diagnosis Method of lymph node assessment: Clinical Histologic grading system: 3 grade system   12/05/2021 Surgery   Right breast lumpectomy showed invasive ductal carcinoma with extracellular mucin, 1.2 cm, grade 2.  Ductal carcinoma in situ.  All surgical margins negative for carcinoma, invasive carcinoma 0.2 cm from inferior margin lymph nodes not submitted.  Initial prognostic showed ER 100% positive strong staining, PR 100% positive strong staining, HER2 equivocal with IHC.  Negative with FISH ratio 1.28, Ki-67 1%    12/11/2021 Genetic Testing   Pathogenic variant detected in CHEK2 at c.1100delC ([id number]).  Variant of uncertain significance detected in MSH2 at  p.S87C (c.260C>G).  The report date is 12/11/2021.   The [employer]-Cancer+RNAinsight panel offered by [employer] includes sequencing and rearrangement analysis for the following 47 genes:  APC, ATM, AXIN2, BARD1, BMPR1A, BRCA1, BRCA2, BRIP1, CDH1, CDK4, CDKN2A,  CHEK2, DICER1, EPCAM, GREM1, HOXB13, MEN1, MLH1, MSH2, MSH3, MSH6, MUTYH, NBN, NF1, NF2, NTHL1, PALB2, PMS2, POLD1, POLE, PTEN, RAD51C, RAD51D, RECQL, RET, SDHA, SDHAF2, SDHB, SDHC, SDHD, SMAD4, SMARCA4, STK11, TP53, TSC1, TSC2, and VHL.  RNA data is routinely analyzed for use in variant interpretation for all genes.     Interval history  She is here for follow up on tamoxifen. She tried both 20 mg and 10 mg, she felt very tired and depressed, so discontinued it.  The patient, with a history of breast cancer, presents for a routine follow-up. She reports feeling 'really well' and has been active, traveling, and going to the gym daily. She has lost thirty-five pounds intentionally after being diagnosed with fatty liver disease. She has been checking her breasts regularly and has not noticed any new lumps. The surgical scar is 'still kind of bumpy' but is fading and is less tender than before. She denies any changes in bowel movements or urination, and has not had any falls since last year.  Bone density showed osteoporosis. She tried some bisphosphonates long time ago, she couldn't tolerate it. Rest of the pertinent 10 point ROS reviewed and negative.  MEDICAL HISTORY:  Past Medical History:  Diagnosis Date   Anxiety    Situatual   Asthmatic bronchitis    Basal cell carcinoma of breast 1990's?   "left side, burned it off" (06/07/2013)   Breast cancer (HCC) 11/05/2021   COPD (chronic obstructive pulmonary disease) (HCC)    GERD (gastroesophageal reflux disease)    H/O hiatal hernia    Hypertension    Monoallelic mutation of CHEK2 gene in female patient 12/12/2021   Osteoporosis    Schatzki's ring    Shortness of breath    "  related to asthmatic bronchitis only" (06/07/2013)    SURGICAL HISTORY: Past Surgical History:  Procedure Laterality Date   ABDOMINAL HYSTERECTOMY  06/28/1979   BREAST BIOPSY Right 11/05/2021   BREAST LUMPECTOMY WITH RADIOACTIVE SEED LOCALIZATION Right 12/05/2021    Procedure: RIGHT BREAST LUMPECTOMY WITH RADIOACTIVE SEED LOCALIZATION;  Surgeon: Manus Rudd, MD;  Location: [facility] OR;  Service: General;  Laterality: Right;   CATARACT EXTRACTION W/ INTRAOCULAR LENS  IMPLANT, BILATERAL Bilateral 06/27/1989   DILATION AND CURETTAGE OF UTERUS  06/28/1979   "1" (06/07/2013)   ESOPHAGEAL DILATION  10/27/2010   "just once" (06/07/2013)   HIP PINNING,CANNULATED Right 10/09/2014   Procedure: CANNULATED HIP PINNING;  Surgeon: Sheral Apley, MD;  Location: [facility] OR;  Service: Orthopedics;  Laterality: Right;   URETHRAL DIVERTICULUM REPAIR  06/27/1969    SOCIAL HISTORY: Social History   Socioeconomic History   Marital status: Married    Spouse name: Not on file   Number of children: Not on file   Years of education: Not on file   Highest education level: Not on file  Occupational History   Not on file  Tobacco Use   Smoking status: Former    Current packs/day: 0.00    Average packs/day: 0.3 packs/day for 13.0 years (4.3 ttl pk-yrs)    Types: Cigarettes    Start date: 11/28/1955    Quit date: 11/27/1968    Years since quitting: 54.7   Smokeless tobacco: Never  Vaping Use   Vaping status: Never Used  Substance and Sexual Activity   Alcohol use: Yes   Drug use: No   Sexual activity: Yes  Other Topics Concern   Not on file  Social History Narrative   Not on file   Social Determinants of Health   Financial Resource Strain: Not on file  Food Insecurity: Not on file  Transportation Needs: Not on file  Physical Activity: Not on file  Stress: Not on file  Social Connections: Not on file  Intimate Partner Violence: Not At Risk (11/28/2021)   Humiliation, Afraid, Rape, and Kick questionnaire    Fear of Current or Ex-Partner: No    Emotionally Abused: No    Physically Abused: No    Sexually Abused: No    FAMILY HISTORY: Family History  Problem Relation Age of Onset   Cancer Mother    Stroke Mother    Hypertension Mother    Prostate cancer Father     Cancer Maternal Aunt        unknown type; dx after 19   Leukemia Maternal Uncle    Breast cancer Cousin        maternal female cousin; dx 4s   Breast cancer Cousin        maternal female cousin; dx 78s   Breast cancer Half-Brother 51    ALLERGIES:  is allergic to ezetimibe, statins, alendronate sodium, ibandronic acid, and spiriva [tiotropium bromide monohydrate].  MEDICATIONS:  Current Outpatient Medications  Medication Sig Dispense Refill   albuterol (PROVENTIL) (2.5 MG/3ML) 0.083% nebulizer solution TAKE 3 MLS BY NEBULIZER EVERY6 HOURS AS NEEDED FOR WHEEZING OR SHORTNESS OF BREATH 75 mL 6   albuterol (VENTOLIN HFA) 108 (90 Base) MCG/ACT inhaler INHALE 2 PUFFS INTO LUNGS EVERY 6 HRS AS NEEDED FOR WHEEZING/SHORTNESS OF BREATH *NEEDS APPOINTMENT 18 each 3   Ascorbic Acid (VITAMIN C PO) Take 1 tablet by mouth daily.     Budeson-Glycopyrrol-Formoterol (BREZTRI AEROSPHERE) 160-9-4.8 MCG/ACT AERO Inhale 2 puffs into the lungs in the morning and at  bedtime. 32.1 g 3   Calcium Carb-Cholecalciferol (CALCIUM 600 + D PO) Take 1 tablet by mouth every other day.      Cholecalciferol (VITAMIN D-3 PO) Take 1 capsule by mouth daily.     fluticasone (FLONASE) 50 MCG/ACT nasal spray Place 1 spray into both nostrils daily as needed for allergies or rhinitis.     hydrochlorothiazide (HYDRODIURIL) 25 MG tablet Take 25 mg by mouth daily as needed (swelling).     Multiple Vitamin (MULTIVITAMIN WITH MINERALS) TABS tablet Take 1 tablet by mouth daily.     No current facility-administered medications for this visit.     PHYSICAL EXAMINATION: ECOG PERFORMANCE STATUS: 0 - Asymptomatic  Vitals:   07/30/23 1135  BP: 129/87  Pulse: 77  Resp: 16  Temp: (!) 97 F (36.1 C)  SpO2: 99%      Filed Weights   07/30/23 1135  Weight: 152 lb 14.4 oz (69.4 kg)     Physical Exam Constitutional:      Appearance: Normal appearance.  Chest:     Comments: Bilateral breast examined.  Postop changes.   Otherwise no palpable masses or regional adenopathy. Musculoskeletal:     Cervical back: Normal range of motion. No rigidity.  Lymphadenopathy:     Cervical: No cervical adenopathy.  Neurological:     Mental Status: She is alert.     LABORATORY DATA:  I have reviewed the data as listed Lab Results  Component Value Date   WBC 8.3 08/15/2022   HGB 14.3 08/15/2022   HCT 43.1 08/15/2022   MCV 89.9 08/15/2022   PLT 197.0 08/15/2022   Lab Results  Component Value Date   NA 141 11/28/2021   K 3.8 11/28/2021   CL 105 11/28/2021   CO2 27 11/28/2021    RADIOGRAPHIC STUDIES: I have personally reviewed the radiological reports and agreed with the findings in the report.  ASSESSMENT AND PLAN:   Malignant neoplasm of upper-outer quadrant of right breast in female, estrogen receptor positive (HCC) This is a very pleasant 87 year old female patient with past medical history significant for COPD, hypertension referred to medical oncology for new diagnosis of right breast invasive ductal carcinoma noted on a screening mammogram.  She declined adjuvant radiation.  She tried tamoxifen, couldn't tolerate it, didn't want to continue it, hence she is on observation alone.  Post-Breast Surgery No new lumps or pain. Scar is fading and less tender. -Continue self-breast exams and report any changes. -Mammogram due in December, ordered.  Fatty Liver Significant weight loss achieved and maintained, contributing to improved liver health. -Continue current lifestyle modifications including regular exercise and healthy diet.  General Health Maintenance -Order mammogram for first or second week of December. -Schedule follow-up appointment in six months. If patient resumes visits with breast surgeon, annual follow-up will suffice.     Total time spent: 20 minutes counseling about her options for antiestrogen therapy, mechanism of action and adverse effects  All questions were answered. The  patient knows to call the clinic with any problems, questions or concerns.    Rachel Moulds, MD 07/30/23

## 2023-07-29 NOTE — Assessment & Plan Note (Signed)
This is a very pleasant 87 year old female patient with past medical history significant for COPD, hypertension referred to medical oncology for new diagnosis of right breast invasive ductal carcinoma noted on a screening mammogram.  She declined adjuvant radiation.  She tried tamoxifen, couldn't tolerate it, didn't want to continue it, hence she is on observation alone.  Post-Breast Surgery No new lumps or pain. Scar is fading and less tender. -Continue self-breast exams and report any changes. -Mammogram due in December, ordered.  Fatty Liver Significant weight loss achieved and maintained, contributing to improved liver health. -Continue current lifestyle modifications including regular exercise and healthy diet.  General Health Maintenance -Order mammogram for first or second week of December. -Schedule follow-up appointment in six months. If patient resumes visits with breast surgeon, annual follow-up will suffice.

## 2023-07-30 ENCOUNTER — Inpatient Hospital Stay: Payer: Medicare Other | Attending: Hematology and Oncology | Admitting: Hematology and Oncology

## 2023-07-30 VITALS — BP 129/87 | HR 77 | Temp 97.0°F | Resp 16 | Wt 152.9 lb

## 2023-07-30 DIAGNOSIS — Z803 Family history of malignant neoplasm of breast: Secondary | ICD-10-CM | POA: Diagnosis not present

## 2023-07-30 DIAGNOSIS — Z90710 Acquired absence of both cervix and uterus: Secondary | ICD-10-CM | POA: Insufficient documentation

## 2023-07-30 DIAGNOSIS — Z87891 Personal history of nicotine dependence: Secondary | ICD-10-CM | POA: Diagnosis not present

## 2023-07-30 DIAGNOSIS — K760 Fatty (change of) liver, not elsewhere classified: Secondary | ICD-10-CM | POA: Diagnosis not present

## 2023-07-30 DIAGNOSIS — Z8042 Family history of malignant neoplasm of prostate: Secondary | ICD-10-CM | POA: Insufficient documentation

## 2023-07-30 DIAGNOSIS — Z806 Family history of leukemia: Secondary | ICD-10-CM | POA: Diagnosis not present

## 2023-07-30 DIAGNOSIS — Z170 Estrogen receptor positive status [ER+]: Secondary | ICD-10-CM | POA: Insufficient documentation

## 2023-07-30 DIAGNOSIS — C50411 Malignant neoplasm of upper-outer quadrant of right female breast: Secondary | ICD-10-CM | POA: Insufficient documentation

## 2023-08-05 DIAGNOSIS — H353221 Exudative age-related macular degeneration, left eye, with active choroidal neovascularization: Secondary | ICD-10-CM | POA: Diagnosis not present

## 2023-09-01 DIAGNOSIS — Z789 Other specified health status: Secondary | ICD-10-CM | POA: Diagnosis not present

## 2023-09-01 DIAGNOSIS — R899 Unspecified abnormal finding in specimens from other organs, systems and tissues: Secondary | ICD-10-CM | POA: Diagnosis not present

## 2023-09-01 DIAGNOSIS — C50411 Malignant neoplasm of upper-outer quadrant of right female breast: Secondary | ICD-10-CM | POA: Diagnosis not present

## 2023-09-01 DIAGNOSIS — M810 Age-related osteoporosis without current pathological fracture: Secondary | ICD-10-CM | POA: Diagnosis not present

## 2023-09-01 DIAGNOSIS — Z0000 Encounter for general adult medical examination without abnormal findings: Secondary | ICD-10-CM | POA: Diagnosis not present

## 2023-09-01 DIAGNOSIS — Z170 Estrogen receptor positive status [ER+]: Secondary | ICD-10-CM | POA: Diagnosis not present

## 2023-09-01 DIAGNOSIS — Z6823 Body mass index (BMI) 23.0-23.9, adult: Secondary | ICD-10-CM | POA: Diagnosis not present

## 2023-09-01 DIAGNOSIS — E785 Hyperlipidemia, unspecified: Secondary | ICD-10-CM | POA: Diagnosis not present

## 2023-09-01 DIAGNOSIS — I700 Atherosclerosis of aorta: Secondary | ICD-10-CM | POA: Diagnosis not present

## 2023-09-01 DIAGNOSIS — J449 Chronic obstructive pulmonary disease, unspecified: Secondary | ICD-10-CM | POA: Diagnosis not present

## 2023-09-01 DIAGNOSIS — K7689 Other specified diseases of liver: Secondary | ICD-10-CM | POA: Diagnosis not present

## 2023-10-06 ENCOUNTER — Ambulatory Visit: Payer: Medicare Other | Admitting: Internal Medicine

## 2023-10-07 ENCOUNTER — Ambulatory Visit: Payer: Medicare Other | Admitting: Internal Medicine

## 2023-10-07 DIAGNOSIS — H353221 Exudative age-related macular degeneration, left eye, with active choroidal neovascularization: Secondary | ICD-10-CM | POA: Diagnosis not present

## 2023-10-07 DIAGNOSIS — H353211 Exudative age-related macular degeneration, right eye, with active choroidal neovascularization: Secondary | ICD-10-CM | POA: Diagnosis not present

## 2023-10-09 ENCOUNTER — Ambulatory Visit
Admission: RE | Admit: 2023-10-09 | Discharge: 2023-10-09 | Disposition: A | Discharge status: Home / Self Care | Payer: Medicare Other | Source: Ambulatory Visit | Attending: Hematology and Oncology

## 2023-10-09 DIAGNOSIS — Z170 Estrogen receptor positive status [ER+]: Secondary | ICD-10-CM

## 2023-10-09 DIAGNOSIS — Z853 Personal history of malignant neoplasm of breast: Secondary | ICD-10-CM | POA: Diagnosis not present

## 2023-11-04 ENCOUNTER — Other Ambulatory Visit: Payer: Self-pay | Admitting: Internal Medicine

## 2023-11-04 DIAGNOSIS — K760 Fatty (change of) liver, not elsewhere classified: Secondary | ICD-10-CM | POA: Diagnosis not present

## 2023-11-04 DIAGNOSIS — K7689 Other specified diseases of liver: Secondary | ICD-10-CM | POA: Diagnosis not present

## 2023-11-04 DIAGNOSIS — R945 Abnormal results of liver function studies: Secondary | ICD-10-CM

## 2023-11-06 ENCOUNTER — Ambulatory Visit
Admission: RE | Admit: 2023-11-06 | Discharge: 2023-11-06 | Disposition: A | Discharge status: Home / Self Care | Payer: Medicare Other | Source: Ambulatory Visit | Attending: Internal Medicine

## 2023-11-06 DIAGNOSIS — R945 Abnormal results of liver function studies: Secondary | ICD-10-CM

## 2023-11-06 DIAGNOSIS — K760 Fatty (change of) liver, not elsewhere classified: Secondary | ICD-10-CM

## 2023-11-11 DIAGNOSIS — K7689 Other specified diseases of liver: Secondary | ICD-10-CM | POA: Diagnosis not present

## 2023-11-16 NOTE — Progress Notes (Unsigned)
OV 07/23/2015  Chief Complaint  Patient presents with   Follow-up    Pt here to discuss CT results and PFT results. Pt states breathing is better since last visit. She is now walking 2 miles three times a week     Follow-up Gold stage II COPD: Currently only on Symbicort. She stopped Spiriva due to itching. She also stopped [place] in past due to cost issues. With Symbicort she is doing really well. She is walking 2 miles a week. Her effort tolerance is improved. She is hardly ever symptomatic. FEV1 today 1.85 L/76% and a ratio of 74 showing very mild obstruction. There are no new issues. She will have a flu shot today. We discussed switching to a long-acting anticholinergic associated with long-acting beta agonist and skipping inhaled steroidal given recent history of hip fracture but she's not interested to change due to cost issues and because she's currently doing well  Multiple lung nodules: She had CT scan of the chest 08/03/2015: She has multiple lung nodules largest 4 mm. This is all stable since October 2014 completing near 2 year stability. She is currently 88 years of age and therefore will not qualify for future low-dose lung cancer screening CT   OV 04/30/2016  Chief Complaint  Patient presents with   Follow-up    breathing has been doing well.  no concerns.CAT Score: 11    Follow-up moderate COPD he is a 9 month routine follow-up. She is doing well. No urgent care visits. No emergency room visits. No prednisone use. No new medical problems diagnosis. Few [place] and now she has a scratchy throat but several weeks ago she had a cold and did not flareup in the COPD exacerbation. She wants to keep an eye on her cold. She's not interested in inhaler change. She likes her Symbicort. She has enough refills and will call us if needed. She's got good quality of life.   OV 02/09/2017  Chief Complaint  Patient presents with   Follow-up    Pt states  she feels the Symbicort 160 helps better than the 80, she is currently on the 80 dose. Pt c/o throat tickle, hoarseness, and SOB with over activity.  Pt denies chest congestion, CP/tightness.     Follow-up moderate COPD  Since last visit I dropped her Symbicort to the lower dose. She's had 2 exacerbations since then. She also believes that the lower dose Symbicort is not controlling her COPD as well but she's not fully sure if it is a steroid effect on just an individual variation depending on the season. In December she does not want to go up on the steroid dose to a full dose Symbicort. In the past. Event caused itching so this time she wants to hold off on any anticholinergic but if she continues to feel that lower dose Symbicort is not controlling a COPD well then she'll be open to adding a different anticholinergic other than Spiriva going up on the steroid dose. Otherwise doing well. The spring season though is causing some postnasal drainage and increased cough and ticklish throat.   OV 08/14/2017  Chief Complaint  Patient presents with   Follow-up    Pt states that she has been doing good. Pt is currently on presnisone due to arthritis but has two days left of that. Was a little SOB but the prednisone she is currently on is helping with that.   Follow-up moderate COPD  Last seen April 2018. Normally does 9 month follow-up. But at last visit. Drop the steroid dose and therefore she is here in 6 months. COPD stable no exacerbations. Recently shehad neck spasm and is currently finishing a prednisone taper given by orthopedics and this is actually helping his COPD symptoms. COPD cat score is 7 and only shows mild symptom burden. She wants flu shot. No new issues. Chest x-ray September 2018 per report is clear   OV 05/18/2018  Chief Complaint  Patient presents with   Follow-up    Pt states breathing has been worse with the humidity and hot weather. States she has been having to use  rescue inhaler at least twice a day. Pt also states she has been a little more SOB recently, has an occ. cough with clear mucus, but denies any CP.    Follow-up moderate COPD  This is a routine follow-up. She normally does 9 month follow-ups. Last visit October 2018 periods in the interim she's continued to do well. COPD cat score is 6 and slightly better/same compared to the before. She does daily exercises at [facility] which helps. Recently with humidity she is dealing with some increased postnasal drip for which she takes nasal steroids as needed. In the interim has been no medical issues are ER visits or urgent care visits or prednisone use or new medical diagnoses. Last chest x-ray September 2018 clear      OV 06/14/2020  Subjective:  Patient ID: Lavell Maragh, female , DOB: [birth year] , age 6 y.o. , MRN: [id number] , AD[address]: [address] [place] [address]   06/14/2020 -   Chief Complaint  Patient presents with   Follow-up    Productive cough with clear phlegm   Follow-up moderate COPD.  HPI Phyllistine Shutters 88 y.o. -last seen by myself just over 2 years ago in July 2019.  Then in January 2020 she had a COPD exacerbation and seen by nurse practitioner.  Then after that the COVID-19 pandemic struck.  Since then she has been isolating.  We have not been able to see her till today.  She says she is doing well.  She is on Symbicort.  She wants refills.  COPD CAT score is stable.  No interim medical issues no ER visits.  She normally gets vaccines.  However she is reluctant to get the Covid vaccine because of lack of long-term side effects.  She recalls the thalidomide disaster that happened when it was 5 years into the drug and people discovered the side effects.  I acknowledge that we do not have long-term side effect profile on Covid vaccine because of the short duration of the pandemic.  She is really good at social distancing and masking.  We discussed Regeneron monoclonal antibodies  as a way to mitigate her risk in terms of both prophylaxis and treating infection.       OV 03/14/2021  Subjective:  Patient ID: Deasiah Bulkley, female , DOB: 06-01-1936 , age 65 y.o. , MRN: [id number] , AD[address]ESS: [address] [place] [address] PCP Jarrett Soho, PA-C Patient Care Team: Jarrett Soho, PA-C as PCP - General (Family Medicine) Kalman Shan, MD as Consulting Physician (Pulmonary Disease)  This Provider for this visit: Treatment Team:  Attending Provider: Kalman Shan, MD    03/14/2021 -   Chief Complaint  Patient presents with   Follow-up    Pt states she was doing okay since last visit until about 1 week ago due to having problems from  pollen and states she was having more problems with SOB and also states that she has been coughing which is also keeping her up at night.     HPI Estell Sypher 88 y.o. -COPD follow-up.  In this visit she says she continues to use her Symbicort.  She brings her husband with her.  She normally exercises at the [facility]  She is not vaccinated against COVID and will not get vaccinated.  She says that she is not coming to close contact with anyone.  No clusters.  Other than when she exercises she is masking.  She avoids people.  However she got exposed to pollen late last week.  Then approximately on Friday, Mar 08, 2021 she worked in the garden with some hanging buds.  Then starting Sunday, Mar 10, 2021 she started getting cough with yellow sputum.  She sounds congested which she says is no sinus drainage.  She feels tired she is more short of breath.  There might be some associated increase in wheezing.  No fever.  The same day she checked her COVID rapid antigen test at home and was negative.  Husband is not sick.  She is not vaccinated against COVID no other sick contacts.  She does not think it is COVID but is willing to get tested with PCR.       OV 07/17/2021  Subjective:  Patient ID: Tayjah Raben, female ,  DOB: 05-01-1936 , age 51 y.o. , MRN: [id number] , ADDRESS: [address] [place] [address] PCP Jarrett Soho, PA-C Patient Care Team: Jarrett Soho, PA-C as PCP - General (Family Medicine) Kalman Shan, MD as Consulting Physician (Pulmonary Disease)  This Provider for this visit: Treatment Team:  Attending Provider: Kalman Shan, MD    07/17/2021 -   Chief Complaint  Patient presents with   Follow-up    Pt states she had covid about 2 weeks ago. States she is tired all the time and feels like she cannot get her strength back. States she has been coughing some and also has had some increased SOB.   Gold stage II COPD on Symbicort   HPI Saraia Madill 88 y.o. -she has Gold stage II COPD on Symbicort.  Last visit May 2022.  Had refused COVID-vaccine.  After that couple of for telephone calls for COPD exacerbation treated with prednisone.  Most recently towards the end of August 2022.  Then on 06/27/2021 we did high-resolution CT chest.  A suggestion of early ILD.  Then several days later around Labor Day 2022 developed COVID .  She did not call us for any antiviral.  She rested and then missed her daughter's wedding and then subsequently got better.  Currently having some residual cough fatigue and shortness of breath and occasional white sputum.  Things are getting better but she feels prednisone could help her.  Today the first day out of the house.  No leg swelling.  No hemoptysis.  No fever or chills. CLINICAL DATA:  88 year old female with history of dyspnea on exertion. Increasing shortness of breath since May 2022. Currently on fourth round of prednisone treatment. History of COPD. Evaluate for interstitial lung disease.   EXAM: CT CHEST WITHOUT CONTRAST   TECHNIQUE: Multidetector CT imaging of the chest was performed following the standard protocol without intravenous contrast. High resolution imaging of the lungs, as well as inspiratory and expiratory  imaging, was performed.   COMPARISON:  Chest CT 07/04/2015.   FINDINGS: Cardiovascular: Heart size is borderline enlarged. There  is no significant pericardial fluid, thickening or pericardial calcification. Aortic atherosclerosis. No definite coronary artery calcifications. Mild calcifications of the mitral annulus.   Mediastinum/Nodes: No pathologically enlarged mediastinal or hilar lymph nodes. Please note that accurate exclusion of hilar adenopathy is limited on noncontrast CT scans. Esophagus is unremarkable in appearance. No axillary lymphadenopathy.   Lungs/Pleura: High-resolution images demonstrate areas of mild septal thickening and severe thickening of the peribronchovascular interstitium with some linear areas of architectural distortion and volume loss, most evident in the right middle lobe and inferior segment of the lingula. No significant regions of traction bronchiectasis or honeycombing. Inspiratory and expiratory imaging demonstrates moderate air trapping indicative of small airways disease. No acute consolidative airspace disease. No pleural effusions. Small calcified granulomas are present. No other suspicious appearing pulmonary nodules or masses are noted.   Upper Abdomen: Aortic atherosclerosis.   Musculoskeletal: There are no aggressive appearing lytic or blastic lesions noted in the visualized portions of the skeleton.   IMPRESSION: 1. The appearance of the lungs may suggest very early or mild interstitial lung disease, with a spectrum of findings considered indeterminate for usual interstitial pneumonia (UIP) per current ATS guidelines, as detailed above. If there is persistent clinical concern for interstitial lung disease, repeat high-resolution chest CT would be recommended in 12 months to assess for temporal changes in the appearance of the lung parenchyma. 2. Aortic atherosclerosis.   Aortic Atherosclerosis (ICD10-I70.0).     Electronically  Signed   By: Trudie Reed M.D.   On: 06/27/2021 15:40      OV 08/30/2021  Subjective:  Patient ID: Iviana Rusconi, female , DOB: Dec 31, 1935 , age 73 y.o. , MRN: [id number] , ADDRESS: [address] [place] [address] PCP Jarrett Soho, PA-C Patient Care Team: Jarrett Soho, PA-C as PCP - General (Family Medicine) Kalman Shan, MD as Consulting Physician (Pulmonary Disease)  This Provider for this visit: Treatment Team:  Attending Provider: Kalman Shan, MD    08/30/2021 -   Chief Complaint  Patient presents with   Follow-up    Patient has not been feeling good for the last 2 weeks. She called 911 this morning for her breathing and they gave her a breathing treatment. Lots of wheezing, increased shortness of breath. Productive Cough with yellow sputum    Gold stage II COPD on Symbicort  Possible early ILD on CT scan of the chest early September September 2022 pre-COVID COVID  Labor Day 2022 history of outpatient COVID  Multiple COPD exacerbations in 2022 As of HPI Kim Pardy 88 y.o. -last visit 17 July 2021.  At that time we gave her prednisone 5 days.  Then around August 05, 2021 she called and we had to give another prednisone.  Because of repeated COPD exacerbations did IgE this was normal.  Her blood eosinophils was high at 1200 cells per cubic millimeter.  (In 2014 it was 600 cells but then subsequently became 0 cells].  Her rheumatoid factor is slightly positive.  Rest of the autoimmune is normal.  She now tells me that the last that she finished prednisone taper she started on cholesterol medication ZETIA.Marland Kitchen  She is not sure of the exact date but she suspects this was around a few weeks ago.  Then 3 days into it she started feeling short of breath.  She stopped it but she feels respiratory exacerbation is worse she feels is because of his diarrhea.  She is continue to wheeze and have chest tightness and cough no sputum production.  She  called 911 today.  They came to home and gave albuterol nebulizer.  They did not take her to the ER.  She decided to keep this appointment.  She has upcoming PFT appointment and clinic visit in a couple weeks.  She is really frustrated with repeated exacerbations.  She is wondering what is going on.  Today there is audible wheezing but she was not using accessory muscles.  Her PFT appointment is pending.  COPD CAT score shows significant deterioration.  See below.   OV 09/12/2021  Subjective:  Patient ID: Leliana Low, female , DOB: [birth year] , age 69 y.o. , MRN: [id number] , ADDRESS: [address] [place] [address] PCP Jarrett Soho, PA-C Patient Care Team: Jarrett Soho, PA-C as PCP - General (Family Medicine) Kalman Shan, MD as Consulting Physician (Pulmonary Disease)  This Provider for this visit: Treatment Team:  Attending Provider: Kalman Shan, MD   09/12/2021 -   Chief Complaint  Patient presents with   Follow-up    Markus Daft is working better     HPI Faviola Winning 88 y.o. -returns for follow-up.  Seen earlier in the month for COPD exacerbation and repeated exacerbations.  Given prednisone and also given Breztri.  We are looking for other causes for multiple repeated exacerbations.  I wanted RAST allergy panel to be done 2 weeks after she finishes prednisone.  She only prednisone prednisone a few days ago.  She is going to wait and get it.  After the recent steroid burst she is feeling better.  She did have repeat pulmonary function testing it shows a slight decline compared to 2016.  Nevertheless she is feeling better with the [facility].  She is beginning to go back to the [facility] and start exercising.  She is now walking half a mile [baseline 2 miles].  She is okay with approach of watchful waiting at this point.  She did a walking desaturation test and did not desaturate.  She walked 185 feet x 3 laps in the office.    CT Chest data  No results  found.   02/25/2022: OV with Groce,NP for follow-up.  She was treated for a flareup in January with doxycycline and prednisone course.  She had good response to treatment.  She was then diagnosed with breast cancer in February.  At OV, reported that she has been doing okay.  She does feel like she has some increased wheezing and shortness of breath which she has been having to use her rescue inhaler more for.  Feels like this is related to allergies.  Denies any fever and secretions are clear.  Treated with prednisone taper.  Continued Flonase for allergies and advised adding on Claritin or other over-the-counter antihistamine for allergies.  Recommended to call if she had any changes in her secretions.  Continued on triple therapy with Breztri and as needed albuterol.  04/24/2022: OV with Cobb NP for acute visit.  She reports that she developed increased cough and shortness of breath at the beginning of the week.  She has also been wheezing more.  Her cough is productive with yellow sputum.  She has been using her nebs a couple times a day, which do help with her breathing and wheezing  She denies any fevers, night sweats, hemoptysis, anorexia, weight loss, lower extremity edema.  She continues on [place] and has been using her albuterol more recently.  She recently came off tamoxifen.  She had been having ongoing issues while being on it including fatigue  and depressed mood.  She then developed a rash and decided she did not want to be on any longer.  She has plans to follow-up with oncology in August to determine next steps. Treated for AECOPD with empiric doxy and prednisone taper. Continue Breztri and prn albuterol. CXR without superimposed infection/acute process.   05/08/2022: Today - follow up Patient presents today for follow up after being treated for AECOPD with prednisone taper and doxy course. She reports feeling significantly better today. Feels like the medications really helped her. Her breathing  has returned to her baseline and her cough is minimal, non-productive. She is ready to get back to the gym and start exercising regularly again. She denies any fevers, hemoptysis, night sweats, wheezing, weight loss. She still has been fighting the rash on the back of her head and on her upper back. It was originally thought to be related to Tamoxifen and/or possibly fungal in nature. She has since stopped Tamoxifen. Her oncologist prescribed ketoconazole shampoo, which she felt made the rash worse. She told me today that the rash completely went away when she was on the doxy and prednisone. It returned about 2-3 days later. She describes it as red and itchy, occasionally is flaky. Denies any exudate, warmth or open lesions.   OV 08/15/2022  Subjective:  Patient ID: Keylah Reather Converse, female , DOB: [birth year] , age 88 y.o. , MRN: [id number] , ADDRESS: [address] [place] [address] PCP Jarrett Soho, PA-C Patient Care Team: Jarrett Soho, PA-C as PCP - General (Family Medicine) Kalman Shan, MD as Consulting Physician (Pulmonary Disease)  This Provider for this visit: Treatment Team:  Attending Provider: Kalman Shan, MD    08/15/2022 -   Chief Complaint  Patient presents with   Follow-up    PFT performed today. Pt states she has been doing well since last visit and denies any complaints.     HPI Camyla G Newill 88 y.o. -returns for follow-up.  At this point in time she is on triple inhaler therapy with albuterol as needed.  She has no longer had COPD exacerbation since summer 2023.  She says that because of stress and other issues such as breast cancer in February 2023 and a fall in May 2023 she was having frequent flareups.  She says since then she has learned to control her stress and the COPD exacerbation is better.  A year ago she had blood eosinophils that was significantly elevated.  She is agreeable to check it but we discussed options of Roflumilast versus  enrolling in a research study versus standard of care Dupixent therapy.  She prefers to maintain the status quo.  She is suspected to have ILD.  I personally visualized the CT chest from October 2023.  She has interstitial lung abnormalities.  Her pulmonary function test and CT scan that I personally visualized are all stable.  We discussed her respiratory vaccines and she does not want to take any.    OV 12/30/2022  Subjective:  Patient ID: Bayle Reather Converse, female , DOB: 1936-02-11 , age 63 y.o. , MRN: [id number] , ADDRESS: [address] [place] [address] PCP Jarrett Soho, PA-C Patient Care Team: Jarrett Soho, PA-C as PCP - General (Family Medicine) Kalman Shan, MD as Consulting Physician (Pulmonary Disease)  This Provider for this visit: Treatment Team:  Attending Provider: Kalman Shan, MD   12/30/2022 -   Chief Complaint  Patient presents with   Follow-up    PFT  HPI Saree G Mosley 88 y.o. -returns for 5-73-month follow-up.  She continues to do well.  No flareups and COPD since going on Breztri and also the summer 2023.  She attributes this to a lower level of stress in her life after the breast cancer surgery.  She believes the exacerbations was related to the fall and breast cancer issues.  Currently able to walk half mile without stopping at the [facility].  Then she takes a brief pause and is increased herself to 1 mile exertion.  The current symptom scores are listed below.  She does not want to do any respiratory vaccines.  She did have pulmonary function test today and is essentially close to normal.  No new emergency room visits.  No hospitalizations.  No big medication changes.      CT Chest data - HRCT 07/28/22   Narrative & Impression  CLINICAL DATA:  88 year old female with history of shortness of breath and dyspnea on exertion. Pulmonary nodule. History of right-sided breast cancer status post lumpectomy. * Tracking Code: BO *    EXAM: CT CHEST WITHOUT CONTRAST   TECHNIQUE: Multidetector CT imaging of the chest was performed following the standard protocol without intravenous contrast. High resolution imaging of the lungs, as well as inspiratory and expiratory imaging, was performed.   RADIATION DOSE REDUCTION: This exam was performed according to the departmental dose-optimization program which includes automated exposure control, adjustment of the mA and/or kV according to patient size and/or use of iterative reconstruction technique.   COMPARISON:  High-resolution chest CT 06/27/2021.   FINDINGS: Cardiovascular: Heart size is normal. There is no significant pericardial fluid, thickening or pericardial calcification. Atherosclerotic calcifications in the thoracic aorta. No definite coronary artery calcifications. Mild calcifications of the mitral annulus.   Mediastinum/Nodes: No pathologically enlarged mediastinal or hilar lymph nodes. Please note that accurate exclusion of hilar adenopathy is limited on noncontrast CT scans. Esophagus is unremarkable in appearance. No axillary lymphadenopathy.   Lungs/Pleura: High-resolution images demonstrate minimal septal thickening, thickening of the peribronchovascular interstitium and regional areas of architectural distortion in the lung bases bilaterally, stable compared to the prior study. No subpleural reticulation, traction bronchiectasis or honeycombing is noted. Inspiratory and expiratory imaging demonstrates moderate air trapping indicative of small airways disease. No acute consolidative airspace disease. No pleural effusions. 3 mm pulmonary nodule in the periphery of the right lower lobe (axial image 87 of series 8), unchanged in retrospect compared to the prior study. No other definite suspicious appearing pulmonary nodules or masses are noted.   Upper Abdomen: Aortic atherosclerosis.   Musculoskeletal: There are no aggressive appearing lytic or  blastic lesions noted in the visualized portions of the skeleton.   IMPRESSION: 1. Overall, the appearance of the lungs is stable compared to the prior study, with a spectrum of findings that may simply represent areas of mild chronic post infectious or inflammatory scarring. Findings are once again categorized as indeterminate for usual interstitial pneumonia (UIP) per current ATS guidelines, however, given the stability of the findings and the overall appearance, interstitial lung disease is not excluded, but not strongly favored. 2. Aortic atherosclerosis.   Aortic Atherosclerosis (ICD10-I70.0).     Electronically Signed   By: Trudie Reed M.D.   On: 07/29/2022 08:45      OV 11/17/2023  Subjective:  Patient ID: Talyah Reather Converse, female , DOB: 1935-12-09 , age 55 y.o. , MRN: [id number] , AD[address]ESS: [address] [place] [address] PCP Jarrett Soho, PA-C Patient Care Team: Delena Serve,  Rulon Eisenmenger as PCP - General (Family Medicine) Kalman Shan, MD as Consulting Physician (Pulmonary Disease)  This Provider for this visit: Treatment Team:  Attending Provider: Kalman Shan, MD    Gold stage II COPD on Symbicort  -Although March 2024 CT scan close normal.  Multiple COPD exacerbations  - eos 600 in oct 2023  ILA v Possible early ILD on CT scan of the chest early September September 2022 pre-COVID COVID  - Labor Day 2022 history of outpatient COVID  = not interestedin CT chest as of spring 2024   Borderlne positive RF - 47 in Oct 22 (other serology negative)  NEg Sharene Butters Gold Oct 2022  11/17/2023 -   Chief Complaint  Patient presents with   Follow-up     HPI Kylene G Fielding 87 y.o. -presents for follow-up.  She said she was supposed to see me in 6 months but because of scheduling issues and a friend just not getting back to her it has now been 10 months.  She is no loss a lot of weight because of report of fatty liver.  She is intentionally  losing weight.  She is now walking 1 and half miles 4 to 5 days a week.  She has no shortness of breath.  She has very mild cough and a cold air now but she is definitely not having exacerbation.  Markus Daft is working really well for her.  She will not do the flu shot and she last visit said she is not interested in CT scans.   SYMPTOM SCALE - 12/30/2022 11/17/2023   Current weight    O2 use ra   Shortness of Breath 0 -> 5 scale with 5 being worst (score 6 If unable to do)   At rest 0   Simple tasks - showers, clothes change, eating, shaving 3 0  Household (dishes, doing bed, laundry) 4 0  Shopping 4 1  Walking level at own pace 2 0  Walking up Stairs 4 1  Total (30-36) Dyspnea Score 15 2  How bad is your cough? 0 0  How bad is your fatigue 2 0  How bad is nausea 0 00  How bad is vomiting?  0 0  How bad is diarrhea? 0 0  How bad is anxiety? 0   How bad is depression 0 0  Any chronic pain - if so where and how bad 0 0    PFT     Latest Ref Rng & Units 12/30/2022    2:26 PM 08/15/2022   11:55 AM 09/12/2021   10:53 AM 07/23/2015    9:49 AM  ILD indicators  FVC-Pre L 2.18  2.11  1.92  2.51   FVC-Predicted Pre % 77  75  64  77   FVC-Post L  2.20  2.28  2.55   FVC-Predicted Post %  78  76  79   TLC L  4.62  4.51    TLC Predicted %  83  79    DLCO uncorrected ml/min/mmHg 16.94  15.30  17.02    DLCO UNC %Pred % 82  74  80    DLCO Corrected ml/min/mmHg 16.94  15.30  16.92    DLCO COR %Pred % 82  74  79        LAB RESULTS last 96 hours No results found.  LAB RESULTS last 90 days No results found for this or any previous visit (from the past 2160 hours).  has a past medical history of Anxiety, Asthmatic bronchitis, Basal cell carcinoma of breast (1990's?), Breast cancer (HCC) (11/05/2021), COPD (chronic obstructive pulmonary disease) (HCC), GERD (gastroesophageal reflux disease), H/O hiatal hernia, Hypertension, Monoallelic mutation of CHEK2 gene in female patient  (12/12/2021), Osteoporosis, Schatzki's ring, and Shortness of breath.   reports that she quit smoking about 55 years ago. Her smoking use included cigarettes. She started smoking about 68 years ago. She has a 4.3 pack-year smoking history. She has never used smokeless tobacco.  Past Surgical History:  Procedure Laterality Date   ABDOMINAL HYSTERECTOMY  06/28/1979   BREAST BIOPSY Right 11/05/2021   BREAST LUMPECTOMY WITH RADIOACTIVE SEED LOCALIZATION Right 12/05/2021   Procedure: RIGHT BREAST LUMPECTOMY WITH RADIOACTIVE SEED LOCALIZATION;  Surgeon: Manus Rudd, MD;  Location: [facility] OR;  Service: General;  Laterality: Right;   CATARACT EXTRACTION W/ INTRAOCULAR LENS  IMPLANT, BILATERAL Bilateral 06/27/1989   DILATION AND CURETTAGE OF UTERUS  06/28/1979   "1" (06/07/2013)   ESOPHAGEAL DILATION  10/27/2010   "just once" (06/07/2013)   HIP PINNING,CANNULATED Right 10/09/2014   Procedure: CANNULATED HIP PINNING;  Surgeon: Sheral Apley, MD;  Location: [facility] OR;  Service: Orthopedics;  Laterality: Right;   URETHRAL DIVERTICULUM REPAIR  06/27/1969    Allergies  Allergen Reactions   Ezetimibe Shortness Of Breath   Statins Other (See Comments)    Increased LFT's   Alendronate Sodium     felt sick   Ibandronic Acid     felt sick   Spiriva [Tiotropium Bromide Monohydrate] Itching    Immunization History  Administered Date(s) Administered   Influenza Split 06/26/2009, 09/05/2010, 07/17/2014, 07/23/2015, 08/14/2017   Influenza Whole 06/27/2012   Influenza, High Dose Seasonal PF 08/25/2012, 08/11/2016, 08/14/2017   Influenza,inj,Quad PF,6+ Mos 07/17/2014, 07/23/2015, 07/27/2016, 11/01/2018   Pneumococcal Conjugate-13 07/17/2014   Pneumococcal Polysaccharide-23 12/12/2008, 06/27/2012   Pneumococcal-Unspecified 06/27/2012   Tdap 06/29/2006, 02/07/2015    Family History  Problem Relation Age of Onset   Cancer Mother    Stroke Mother    Hypertension Mother    Prostate cancer Father     Cancer Maternal Aunt        unknown type; dx after 2   Leukemia Maternal Uncle    Breast cancer Cousin        maternal female cousin; dx 41s   Breast cancer Cousin        maternal female cousin; dx 37s   Breast cancer Half-Brother 67     Current Outpatient Medications:    albuterol (PROVENTIL) (2.5 MG/3ML) 0.083% nebulizer solution, TAKE 3 MLS BY NEBULIZER EVERY6 HOURS AS NEEDED FOR WHEEZING OR SHORTNESS OF BREATH, Disp: 75 mL, Rfl: 6   albuterol (VENTOLIN HFA) 108 (90 Base) MCG/ACT inhaler, INHALE 2 PUFFS INTO LUNGS EVERY 6 HRS AS NEEDED FOR WHEEZING/SHORTNESS OF BREATH *NEEDS APPOINTMENT, Disp: 18 each, Rfl: 3   Ascorbic Acid (VITAMIN C PO), Take 1 tablet by mouth daily., Disp: , Rfl:    Budeson-Glycopyrrol-Formoterol (BREZTRI AEROSPHERE) 160-9-4.8 MCG/ACT AERO, Inhale 2 puffs into the lungs in the morning and at bedtime., Disp: 32.1 g, Rfl: 3   Calcium Carb-Cholecalciferol (CALCIUM 600 + D PO), Take 1 tablet by mouth every other day. , Disp: , Rfl:    Cholecalciferol (VITAMIN D-3 PO), Take 1 capsule by mouth daily., Disp: , Rfl:    fluticasone (FLONASE) 50 MCG/ACT nasal spray, Place 1 spray into both nostrils daily as needed for allergies or rhinitis., Disp: , Rfl:    hydrochlorothiazide (HYDRODIURIL) 25 MG  tablet, Take 25 mg by mouth daily as needed (swelling)., Disp: , Rfl:    Multiple Vitamin (MULTIVITAMIN WITH MINERALS) TABS tablet, Take 1 tablet by mouth daily., Disp: , Rfl:       Objective:   Vitals:   11/17/23 1344  BP: 116/70  Pulse: 91  SpO2: 98%  Weight: 149 lb 3.2 oz (67.7 kg)  Height: 5\' 7"  (1.702 m)    Estimated body mass index is 23.37 kg/m as calculated from the following:   Height as of this encounter: 5\' 7"  (1.702 m).   Weight as of this encounter: 149 lb 3.2 oz (67.7 kg).  @WEIGHTCHANGE @  Filed Weights   11/17/23 1344  Weight: 149 lb 3.2 oz (67.7 kg)     Physical Exam   General: No distress. Looks well O2 at rest: no Cane present: no Sitting  in wheel chair: no Frail: no Obese: no Neuro: Alert and Oriented x 3. GCS 15. Speech normal Psych: Pleasant Resp:  Barrel Chest - no.  Wheeze - no, Crackles - no, No overt respiratory distress CVS: Normal heart sounds. Murmurs - no Ext: Stigmata of Connective Tissue Disease - no HEENT: Normal upper airway. PEERL +. No post nasal drip        Assessment:       ICD-10-CM   1. Stage 2 moderate COPD by GOLD classification (HCC)  J44.9     2. Eosinophilia, unspecified type  D72.10          Plan:     Patient Instructions  Stage 2 moderate COPD by GOLD classification (HCC) Eosinophilia, unspecified type  - current stabl diseae -- But glad no flare up snce summer 2023 with BREZTRI and improved stress and exercise  Plan  -  continue breztril scheduled with albuterol as need -do warm up before exercuse - respect decline flu shot     Followup -9 months or sooner if needed   FOLLOWUP Return in about 9 months (around 08/16/2024) for 15 min visit, copd, with Dr Marchelle Gearing.    SIGNATURE    Dr. Kalman Shan, M.D., F.C.C.P,  Pulmonary and Critical Care Medicine Staff Physician, [facility] Center Director - Interstitial Lung Disease  Program  Pulmonary Fibrosis [facility] Network at [facility] [place], [place], [address]  Pager: [phone], If no answer or between  15:00h - 7:00h: call [phone] Telephone: [phone]  2:06 PM 11/17/2023

## 2023-11-16 NOTE — Patient Instructions (Signed)
Stage 2 moderate COPD by GOLD classification (HCC) COPD, frequent exacerbations (HCC) Eosinophilia, unspecified type  - current stabl diseae -You have had a recent history in the last 1-2 years of frequent flareups which could be driven due to stress or inflammatory cells such as eosinophils in the lung - But glad no flare up snce summer 2023 with BREZTRI and improved stress and exercise  Plan  -  continue breztril scheduled with albuterol as need - can look at weaning at followup  Pulmonary infiltrates -concerning for interstitial lung abnormalities  -October 2023 pulmonary CT scan stable x 1 yer - PFT normal March 2024  Plan  - Continued observation -Repeat spirometry and DLCO in 12 months - will not do another CT chest unless clinically indicated   Followup -6 months or sooner if needed

## 2023-11-17 ENCOUNTER — Ambulatory Visit: Payer: Medicare Other | Admitting: Internal Medicine

## 2023-11-17 VITALS — BP 116/70 | HR 91 | Ht 67.0 in | Wt 149.2 lb

## 2023-11-17 DIAGNOSIS — D721 Eosinophilia, unspecified: Secondary | ICD-10-CM

## 2023-11-17 DIAGNOSIS — J449 Chronic obstructive pulmonary disease, unspecified: Secondary | ICD-10-CM

## 2023-12-02 DIAGNOSIS — H353231 Exudative age-related macular degeneration, bilateral, with active choroidal neovascularization: Secondary | ICD-10-CM | POA: Diagnosis not present

## 2023-12-26 DIAGNOSIS — B349 Viral infection, unspecified: Secondary | ICD-10-CM | POA: Diagnosis not present

## 2023-12-29 ENCOUNTER — Encounter: Payer: Self-pay | Admitting: Internal Medicine

## 2023-12-29 DIAGNOSIS — J441 Chronic obstructive pulmonary disease with (acute) exacerbation: Secondary | ICD-10-CM

## 2023-12-30 MED ORDER — PREDNISONE 10 MG PO TABS: ORAL_TABLET | ORAL | 1 refills | Status: AC

## 2023-12-30 NOTE — Telephone Encounter (Signed)
   Please take prednisone 40 mg x1 day, then 30 mg x1 day, then 20 mg x1 day, then 10 mg x1 day, and then 5 mg x1 day and stop  With 1 refill sent for AECOPD

## 2023-12-31 ENCOUNTER — Encounter: Payer: Self-pay | Admitting: Internal Medicine

## 2023-12-31 MED ORDER — ALBUTEROL SULFATE HFA 108 (90 BASE) MCG/ACT IN AERS
2.0000 | INHALATION_SPRAY | Freq: Four times a day (QID) | RESPIRATORY_TRACT | 3 refills | Status: AC | PRN
Start: 2023-12-31 — End: ?

## 2023-12-31 NOTE — Telephone Encounter (Signed)
 Rx sent to pharmacy

## 2024-01-27 DIAGNOSIS — H353231 Exudative age-related macular degeneration, bilateral, with active choroidal neovascularization: Secondary | ICD-10-CM | POA: Diagnosis not present

## 2024-02-08 ENCOUNTER — Inpatient Hospital Stay: Payer: Medicare Other | Attending: Hematology and Oncology | Admitting: Hematology and Oncology

## 2024-02-08 VITALS — BP 181/90 | HR 74 | Temp 97.4°F | Resp 18 | Ht 67.0 in | Wt 146.4 lb

## 2024-02-08 DIAGNOSIS — C50411 Malignant neoplasm of upper-outer quadrant of right female breast: Secondary | ICD-10-CM | POA: Diagnosis not present

## 2024-02-08 DIAGNOSIS — I10 Essential (primary) hypertension: Secondary | ICD-10-CM | POA: Insufficient documentation

## 2024-02-08 DIAGNOSIS — Z8042 Family history of malignant neoplasm of prostate: Secondary | ICD-10-CM | POA: Diagnosis not present

## 2024-02-08 DIAGNOSIS — Z170 Estrogen receptor positive status [ER+]: Secondary | ICD-10-CM | POA: Diagnosis not present

## 2024-02-08 DIAGNOSIS — R609 Edema, unspecified: Secondary | ICD-10-CM | POA: Diagnosis not present

## 2024-02-08 DIAGNOSIS — Z87891 Personal history of nicotine dependence: Secondary | ICD-10-CM | POA: Insufficient documentation

## 2024-02-08 DIAGNOSIS — Z803 Family history of malignant neoplasm of breast: Secondary | ICD-10-CM | POA: Insufficient documentation

## 2024-02-08 DIAGNOSIS — Z806 Family history of leukemia: Secondary | ICD-10-CM | POA: Diagnosis not present

## 2024-02-08 DIAGNOSIS — Z853 Personal history of malignant neoplasm of breast: Secondary | ICD-10-CM | POA: Insufficient documentation

## 2024-02-08 DIAGNOSIS — M810 Age-related osteoporosis without current pathological fracture: Secondary | ICD-10-CM | POA: Insufficient documentation

## 2024-02-08 NOTE — Progress Notes (Signed)
 [facility] CONSULT NOTE  Patient Care Team: Darnelle Elders, PA-C as PCP - General (Family Medicine) Maire Scot, MD as Consulting Physician (Pulmonary Disease)  CHIEF COMPLAINTS/PURPOSE OF CONSULTATION:  Newly diagnosed breast cancer  HISTORY OF PRESENTING ILLNESS:   Kelly Delgado 88 y.o. female is here because of recent diagnosis of right breast IDC I reviewed her records extensively and collaborated the history with the patient.  SUMMARY OF ONCOLOGIC HISTORY: Oncology History  Malignant neoplasm of upper-outer quadrant of right breast in female, estrogen receptor positive (HCC)  11/25/2021 Initial Diagnosis   Malignant neoplasm of upper-outer quadrant of right breast in female, estrogen receptor positive (HCC)   11/27/2021 Cancer Staging   Staging form: Breast, AJCC 8th Edition - Clinical stage from 11/27/2021: Stage IA (cT1c, cN0, cM0, G2, ER+, PR+, HER2-) - Signed by Bettejane Brownie, PA-C on 11/27/2021 Stage prefix: Initial diagnosis Method of lymph node assessment: Clinical Histologic grading system: 3 grade system   12/05/2021 Surgery   Right breast lumpectomy showed invasive ductal carcinoma with extracellular mucin, 1.2 cm, grade 2.  Ductal carcinoma in situ.  All surgical margins negative for carcinoma, invasive carcinoma 0.2 cm from inferior margin lymph nodes not submitted.  Initial prognostic showed ER 100% positive strong staining, PR 100% positive strong staining, HER2 equivocal with IHC.  Negative with FISH ratio 1.28, Ki-67 1%    12/11/2021 Genetic Testing   Pathogenic variant detected in CHEK2 at c.1100delC ([id number]).  Variant of uncertain significance detected in MSH2 at  p.S87C (c.260C>G).  The report date is 12/11/2021.   The Custom[employer]-Cancer+RNAinsight panel offered by [employer] includes sequencing and rearrangement analysis for the following 47 genes:  APC, ATM, AXIN2, BARD1, BMPR1A, BRCA1, BRCA2, BRIP1, CDH1, CDK4, CDKN2A,  CHEK2, DICER1, EPCAM, GREM1, HOXB13, MEN1, MLH1, MSH2, MSH3, MSH6, MUTYH, NBN, NF1, NF2, NTHL1, PALB2, PMS2, POLD1, POLE, PTEN, RAD51C, RAD51D, RECQL, RET, SDHA, SDHAF2, SDHB, SDHC, SDHD, SMAD4, SMARCA4, STK11, TP53, TSC1, TSC2, and VHL.  RNA data is routinely analyzed for use in variant interpretation for all genes.     She tried tamoxifen, couldn't tolerate it even at low dose. She didn't want to pursue AI because of underlying osteoporosis.  Interval history  Discussed the use of AI scribe software for clinical note transcription with the patient, who gave verbal consent to proceed.  History of Present Illness Kelly Delgado is a 88 year old female who presents for a routine follow-up visit.  She has a history of stage one breast cancer, treated two years ago, with no recurrence or new symptoms. Her last mammogram in December was normal. She has not noticed any changes in her breasts, except for a reduction in size attributed to weight loss.  She has osteoporosis and has experienced side effects from medications, leading her to discontinue them. She manages her condition through regular exercise, including walking two miles four to five times a week and using exercise machines, gradually increasing intensity and repetitions.  Her blood pressure was slightly elevated during the visit, but it is usually normal at home, with recent readings around 125/75 mmHg. She monitors her blood pressure daily and is not currently on antihypertensive medication, having been taken off it previously.  She experienced a respiratory illness lasting nearly two weeks, managed with a five-day course of prednisone due to a persistent cough. She is not currently taking prednisone but has a refill available if needed, especially considering the pollen season.  She experiences swelling in her ankles and feet  during the summer and occasionally takes HCTZ for this condition.  Overall, she reports no significant  changes in her health since her last visit and mentions feeling slightly better. She has been engaging in more physical activity, including regular workouts and walking.   Rest of the pertinent 10 point ROS reviewed and negative.  MEDICAL HISTORY:  Past Medical History:  Diagnosis Date   Anxiety    Situatual   Asthmatic bronchitis    Basal cell carcinoma of breast 1990's?   "left side, burned it off" (06/07/2013)   Breast cancer (HCC) 11/05/2021   COPD (chronic obstructive pulmonary disease) (HCC)    GERD (gastroesophageal reflux disease)    H/O hiatal hernia    Hypertension    Monoallelic mutation of CHEK2 gene in female patient 12/12/2021   Osteoporosis    Schatzki's ring    Shortness of breath    "related to asthmatic bronchitis only" (06/07/2013)    SURGICAL HISTORY: Past Surgical History:  Procedure Laterality Date   ABDOMINAL HYSTERECTOMY  06/28/1979   BREAST BIOPSY Right 11/05/2021   BREAST LUMPECTOMY WITH RADIOACTIVE SEED LOCALIZATION Right 12/05/2021   Procedure: RIGHT BREAST LUMPECTOMY WITH RADIOACTIVE SEED LOCALIZATION;  Surgeon: Manus Rudd, MD;  Location: [facility] OR;  Service: General;  Laterality: Right;   CATARACT EXTRACTION W/ INTRAOCULAR LENS  IMPLANT, BILATERAL Bilateral 06/27/1989   DILATION AND CURETTAGE OF UTERUS  06/28/1979   "1" (06/07/2013)   ESOPHAGEAL DILATION  10/27/2010   "just once" (06/07/2013)   HIP PINNING,CANNULATED Right 10/09/2014   Procedure: CANNULATED HIP PINNING;  Surgeon: Sheral Apley, MD;  Location: [facility] OR;  Service: Orthopedics;  Laterality: Right;   URETHRAL DIVERTICULUM REPAIR  06/27/1969    SOCIAL HISTORY: Social History   Socioeconomic History   Marital status: Married    Spouse name: Not on file   Number of children: Not on file   Years of education: Not on file   Highest education level: Not on file  Occupational History   Not on file  Tobacco Use   Smoking status: Former    Current packs/day: 0.00    Average packs/day:  0.3 packs/day for 13.0 years (4.3 ttl pk-yrs)    Types: Cigarettes    Start date: 11/28/1955    Quit date: 11/27/1968    Years since quitting: 55.2   Smokeless tobacco: Never  Vaping Use   Vaping status: Never Used  Substance and Sexual Activity   Alcohol use: Yes   Drug use: No   Sexual activity: Yes  Other Topics Concern   Not on file  Social History Narrative   Not on file   Social Drivers of Health   Financial Resource Strain: Not on file  Food Insecurity: Not on file  Transportation Needs: Not on file  Physical Activity: Not on file  Stress: Not on file  Social Connections: Not on file  Intimate Partner Violence: Not At Risk (11/28/2021)   Humiliation, Afraid, Rape, and Kick questionnaire    Fear of Current or Ex-Partner: No    Emotionally Abused: No    Physically Abused: No    Sexually Abused: No    FAMILY HISTORY: Family History  Problem Relation Age of Onset   Cancer Mother    Stroke Mother    Hypertension Mother    Prostate cancer Father    Cancer Maternal Aunt        unknown type; dx after 50   Leukemia Maternal Uncle    Breast cancer Cousin  maternal female cousin; dx 53s   Breast cancer Cousin        maternal female cousin; dx 3s   Breast cancer Half-Brother 65    ALLERGIES:  is allergic to ezetimibe, statins, alendronate sodium, ibandronate, and spiriva [tiotropium bromide monohydrate].  MEDICATIONS:  Current Outpatient Medications  Medication Sig Dispense Refill   albuterol (PROVENTIL) (2.5 MG/3ML) 0.083% nebulizer solution TAKE 3 MLS BY NEBULIZER EVERY6 HOURS AS NEEDED FOR WHEEZING OR SHORTNESS OF BREATH 75 mL 6   albuterol (VENTOLIN HFA) 108 (90 Base) MCG/ACT inhaler Inhale 2 puffs into the lungs every 6 (six) hours as needed for wheezing or shortness of breath. 18 each 3   Ascorbic Acid (VITAMIN C PO) Take 1 tablet by mouth daily.     Budeson-Glycopyrrol-Formoterol (BREZTRI AEROSPHERE) 160-9-4.8 MCG/ACT AERO Inhale 2 puffs into the lungs in  the morning and at bedtime. 32.1 g 3   Calcium Carb-Cholecalciferol (CALCIUM 600 + D PO) Take 1 tablet by mouth every other day.      Cholecalciferol (VITAMIN D-3 PO) Take 1 capsule by mouth daily.     fluticasone (FLONASE) 50 MCG/ACT nasal spray Place 1 spray into both nostrils daily as needed for allergies or rhinitis.     hydrochlorothiazide (HYDRODIURIL) 25 MG tablet Take 25 mg by mouth daily as needed (swelling).     Multiple Vitamin (MULTIVITAMIN WITH MINERALS) TABS tablet Take 1 tablet by mouth daily.     predniSONE (DELTASONE) 10 MG tablet Please take prednisone 40 mg x1 day, then 30 mg x1 day, then 20 mg x1 day, then 10 mg x1 day, and then 5 mg x1 day and stop 10 tablet 1   No current facility-administered medications for this visit.     PHYSICAL EXAMINATION: ECOG PERFORMANCE STATUS: 0 - Asymptomatic  Vitals:   02/08/24 1155  BP: (!) 181/90  Pulse: 74  Resp: 18  Temp: (!) 97.4 F (36.3 C)  SpO2: 99%       Filed Weights   02/08/24 1155  Weight: 146 lb 6.4 oz (66.4 kg)      Physical Exam Constitutional:      Appearance: Normal appearance.  Chest:     Comments: Bilateral breast examined.  Postop changes.  Otherwise no palpable masses or regional adenopathy. Musculoskeletal:     Cervical back: Normal range of motion. No rigidity.  Lymphadenopathy:     Cervical: No cervical adenopathy.  Neurological:     Mental Status: She is alert.     LABORATORY DATA:  I have reviewed the data as listed Lab Results  Component Value Date   WBC 8.3 08/15/2022   HGB 14.3 08/15/2022   HCT 43.1 08/15/2022   MCV 89.9 08/15/2022   PLT 197.0 08/15/2022   Lab Results  Component Value Date   NA 141 11/28/2021   K 3.8 11/28/2021   CL 105 11/28/2021   CO2 27 11/28/2021    RADIOGRAPHIC STUDIES: I have personally reviewed the radiological reports and agreed with the findings in the report.  ASSESSMENT AND PLAN:   Malignant neoplasm of upper-outer quadrant of right  breast in female, estrogen receptor positive (HCC) This is a very pleasant 88 year old female patient with past medical history significant for COPD, hypertension referred to medical oncology for new diagnosis of right breast invasive ductal carcinoma noted on a screening mammogram.  She declined adjuvant radiation.  She tried tamoxifen, couldn't tolerate it, didn't want to continue it, hence she is on observation alone.  Assessment and Plan Assessment &  Plan Breast Cancer Stage 1 breast cancer, asymptomatic, normal recent mammogram. Considering Guardent Reveal blood test, not FDA approved. - She wants to think about this. - Order mammogram for December. - Discuss Guardant Reveal blood test if interested.  Osteoporosis Managed with exercise due to medication intolerance. - Continue regular exercise regimen.  Hypertension Elevated office blood pressure, well-controlled at home without medication. - Continue home blood pressure monitoring. - Return if blood pressure remains elevated.  Edema Ankle and foot swelling in summer, uses HCTZ as needed. - Continue HCTZ as needed for swelling.  Follow-up Follow-up scheduled in six months, transitioning to annual visits. - Schedule follow-up appointment in six months.    Total time spent: 30 minutes counseling about her options for antiestrogen therapy, mechanism of action and adverse effects  All questions were answered. The patient knows to call the clinic with any problems, questions or concerns.    Murleen Arms, MD 02/08/24

## 2024-02-08 NOTE — Assessment & Plan Note (Signed)
 This is a very pleasant 88 year old female patient with past medical history significant for COPD, hypertension referred to medical oncology for new diagnosis of right breast invasive ductal carcinoma noted on a screening mammogram.  She declined adjuvant radiation.  She tried tamoxifen, couldn't tolerate it, didn't want to continue it, hence she is on observation alone.  Assessment and Plan Assessment & Plan Breast Cancer Stage 1 breast cancer, asymptomatic, normal recent mammogram. Considering Guardent Reveal blood test, not FDA approved. - She wants to think about this. - Order mammogram for December. - Discuss Guardant Reveal blood test if interested.  Osteoporosis Managed with exercise due to medication intolerance. - Continue regular exercise regimen.  Hypertension Elevated office blood pressure, well-controlled at home without medication. - Continue home blood pressure monitoring. - Return if blood pressure remains elevated.  Edema Ankle and foot swelling in summer, uses HCTZ as needed. - Continue HCTZ as needed for swelling.  Follow-up Follow-up scheduled in six months, transitioning to annual visits. - Schedule follow-up appointment in six months.

## 2024-02-15 ENCOUNTER — Other Ambulatory Visit: Payer: Self-pay | Admitting: Internal Medicine

## 2024-03-23 DIAGNOSIS — H353231 Exudative age-related macular degeneration, bilateral, with active choroidal neovascularization: Secondary | ICD-10-CM | POA: Diagnosis not present

## 2024-05-10 DIAGNOSIS — M25561 Pain in right knee: Secondary | ICD-10-CM | POA: Diagnosis not present

## 2024-05-18 DIAGNOSIS — H353231 Exudative age-related macular degeneration, bilateral, with active choroidal neovascularization: Secondary | ICD-10-CM | POA: Diagnosis not present

## 2024-07-01 DIAGNOSIS — H353231 Exudative age-related macular degeneration, bilateral, with active choroidal neovascularization: Secondary | ICD-10-CM | POA: Diagnosis not present

## 2024-08-08 ENCOUNTER — Telehealth: Payer: Self-pay

## 2024-08-08 NOTE — Telephone Encounter (Signed)
 Spoke with patient and confirmed appointment on 10/14

## 2024-08-09 ENCOUNTER — Inpatient Hospital Stay: Attending: Hematology and Oncology | Admitting: Hematology and Oncology

## 2024-08-09 VITALS — BP 136/72 | HR 64 | Temp 97.5°F | Resp 18 | Wt 150.8 lb

## 2024-08-09 DIAGNOSIS — N644 Mastodynia: Secondary | ICD-10-CM | POA: Insufficient documentation

## 2024-08-09 DIAGNOSIS — Z8042 Family history of malignant neoplasm of prostate: Secondary | ICD-10-CM | POA: Insufficient documentation

## 2024-08-09 DIAGNOSIS — Z806 Family history of leukemia: Secondary | ICD-10-CM | POA: Insufficient documentation

## 2024-08-09 DIAGNOSIS — Z170 Estrogen receptor positive status [ER+]: Secondary | ICD-10-CM

## 2024-08-09 DIAGNOSIS — Z87891 Personal history of nicotine dependence: Secondary | ICD-10-CM | POA: Insufficient documentation

## 2024-08-09 DIAGNOSIS — Z90710 Acquired absence of both cervix and uterus: Secondary | ICD-10-CM | POA: Insufficient documentation

## 2024-08-09 DIAGNOSIS — Z803 Family history of malignant neoplasm of breast: Secondary | ICD-10-CM | POA: Diagnosis not present

## 2024-08-09 DIAGNOSIS — I10 Essential (primary) hypertension: Secondary | ICD-10-CM | POA: Insufficient documentation

## 2024-08-09 DIAGNOSIS — M810 Age-related osteoporosis without current pathological fracture: Secondary | ICD-10-CM | POA: Diagnosis not present

## 2024-08-09 DIAGNOSIS — Z853 Personal history of malignant neoplasm of breast: Secondary | ICD-10-CM | POA: Diagnosis present

## 2024-08-09 DIAGNOSIS — C50411 Malignant neoplasm of upper-outer quadrant of right female breast: Secondary | ICD-10-CM

## 2024-08-09 DIAGNOSIS — K760 Fatty (change of) liver, not elsewhere classified: Secondary | ICD-10-CM | POA: Diagnosis not present

## 2024-08-09 NOTE — Progress Notes (Signed)
 [facility] CONSULT NOTE  Patient Care Team: Katina Pfeiffer, PA-C as PCP - General (Family Medicine) Geronimo Amel, MD as Consulting Physician (Pulmonary Disease)  CHIEF COMPLAINTS/PURPOSE OF CONSULTATION:  Newly diagnosed breast cancer  HISTORY OF PRESENTING ILLNESS:   Kelly Delgado 88 y.o. female is here because of recent diagnosis of right breast IDC I reviewed her records extensively and collaborated the history with the patient.  SUMMARY OF ONCOLOGIC HISTORY: Oncology History  Malignant neoplasm of upper-outer quadrant of right breast in female, estrogen receptor positive (HCC)  11/25/2021 Initial Diagnosis   Malignant neoplasm of upper-outer quadrant of right breast in female, estrogen receptor positive (HCC)   11/27/2021 Cancer Staging   Staging form: Breast, AJCC 8th Edition - Clinical stage from 11/27/2021: Stage IA (cT1c, cN0, cM0, G2, ER+, PR+, HER2-) - Signed by Lanell Donald Stagger, PA-C on 11/27/2021 Stage prefix: Initial diagnosis Method of lymph node assessment: Clinical Histologic grading system: 3 grade system   12/05/2021 Surgery   Right breast lumpectomy showed invasive ductal carcinoma with extracellular mucin, 1.2 cm, grade 2.  Ductal carcinoma in situ.  All surgical margins negative for carcinoma, invasive carcinoma 0.2 cm from inferior margin lymph nodes not submitted.  Initial prognostic showed ER 100% positive strong staining, PR 100% positive strong staining, HER2 equivocal with IHC.  Negative with FISH ratio 1.28, Ki-67 1%    12/11/2021 Genetic Testing   Pathogenic variant detected in CHEK2 at c.1100delC ([id number]).  Variant of uncertain significance detected in MSH2 at  p.S87C (c.260C>G).  The report date is 12/11/2021.   The CustomNext-Cancer+RNAinsight panel offered by [employer] includes sequencing and rearrangement analysis for the following 47 genes:  APC, ATM, AXIN2, BARD1, BMPR1A, BRCA1, BRCA2, BRIP1, CDH1, CDK4, CDKN2A,  CHEK2, DICER1, EPCAM, GREM1, HOXB13, MEN1, MLH1, MSH2, MSH3, MSH6, MUTYH, NBN, NF1, NF2, NTHL1, PALB2, PMS2, POLD1, POLE, PTEN, RAD51C, RAD51D, RECQL, RET, SDHA, SDHAF2, SDHB, SDHC, SDHD, SMAD4, SMARCA4, STK11, TP53, TSC1, TSC2, and VHL.  RNA data is routinely analyzed for use in variant interpretation for all genes.     She tried tamoxifen , couldn't tolerate it even at low dose. She didn't want to pursue AI because of underlying osteoporosis.  Interval history  Discussed the use of AI scribe software for clinical note transcription with the patient, who gave verbal consent to proceed.  History of Present Illness  Kelly Delgado is an 88 year old female who presents with concerns about blood pressure management.  Her blood pressure was measured at 199/80 mmHg at the clinic, significantly higher than her usual home readings of around 127/79 mmHg. She attributes the discrepancy to discomfort from the clinic's blood pressure machine, which causes bruising. She was previously taken off antihypertensive medication due to episodes of hypotension that made her feel faint. She monitors her blood pressure daily and reports significant changes.  She experienced pain radiating into her right breast, initially thought to be a pulled muscle. The pain has since improved, and she has an upcoming mammogram scheduled for December. She feels anxious about the mammogram but notes the pain has decreased.  She has a history of osteoporosis and engages in regular physical activity, walking at least five days a week for about 30 minutes each session. She takes vitamin D3, calcium, and vitamin C supplements and is not on any other medications except for inhalers.  She was diagnosed with fatty liver through an ultrasound and has lost 40 pounds, which she believes has improved her condition. She feels good  overall and does not feel her age.  She receives eye injections for macular degeneration and notes worsening  vision.   Rest of the pertinent 10 point ROS reviewed and negative.  MEDICAL HISTORY:  Past Medical History:  Diagnosis Date   Anxiety    Situatual   Asthmatic bronchitis    Basal cell carcinoma of breast 1990's?   left side, burned it off (06/07/2013)   Breast cancer (HCC) 11/05/2021   COPD (chronic obstructive pulmonary disease) (HCC)    GERD (gastroesophageal reflux disease)    H/O hiatal hernia    Hypertension    Monoallelic mutation of CHEK2 gene in female patient 12/12/2021   Osteoporosis    Schatzki's ring    Shortness of breath    related to asthmatic bronchitis only (06/07/2013)    SURGICAL HISTORY: Past Surgical History:  Procedure Laterality Date   ABDOMINAL HYSTERECTOMY  06/28/1979   BREAST BIOPSY Right 11/05/2021   BREAST LUMPECTOMY WITH RADIOACTIVE SEED LOCALIZATION Right 12/05/2021   Procedure: RIGHT BREAST LUMPECTOMY WITH RADIOACTIVE SEED LOCALIZATION;  Surgeon: Belinda Cough, MD;  Location: [facility] OR;  Service: General;  Laterality: Right;   CATARACT EXTRACTION W/ INTRAOCULAR LENS  IMPLANT, BILATERAL Bilateral 06/27/1989   DILATION AND CURETTAGE OF UTERUS  06/28/1979   1 (06/07/2013)   ESOPHAGEAL DILATION  10/27/2010   just once (06/07/2013)   HIP PINNING,CANNULATED Right 10/09/2014   Procedure: CANNULATED HIP PINNING;  Surgeon: Evalene JONETTA Chancy, MD;  Location: [facility] OR;  Service: Orthopedics;  Laterality: Right;   URETHRAL DIVERTICULUM REPAIR  06/27/1969    SOCIAL HISTORY: Social History   Socioeconomic History   Marital status: Married    Spouse name: Not on file   Number of children: Not on file   Years of education: Not on file   Highest education level: Not on file  Occupational History   Not on file  Tobacco Use   Smoking status: Former    Current packs/day: 0.00    Average packs/day: 0.3 packs/day for 13.0 years (4.3 ttl pk-yrs)    Types: Cigarettes    Start date: 11/28/1955    Quit date: 11/27/1968    Years since quitting: 55.7   Smokeless  tobacco: Never  Vaping Use   Vaping status: Never Used  Substance and Sexual Activity   Alcohol use: Yes   Drug use: No   Sexual activity: Yes  Other Topics Concern   Not on file  Social History Narrative   Not on file   Social Drivers of Health   Financial Resource Strain: Not on file  Food Insecurity: Not on file  Transportation Needs: Not on file  Physical Activity: Not on file  Stress: Not on file  Social Connections: Not on file  Intimate Partner Violence: Not At Risk (11/28/2021)   Humiliation, Afraid, Rape, and Kick questionnaire    Fear of Current or Ex-Partner: No    Emotionally Abused: No    Physically Abused: No    Sexually Abused: No    FAMILY HISTORY: Family History  Problem Relation Age of Onset   Cancer Mother    Stroke Mother    Hypertension Mother    Prostate cancer Father    Cancer Maternal Aunt        unknown type; dx after 83   Leukemia Maternal Uncle    Breast cancer Cousin        maternal female cousin; dx 37s   Breast cancer Cousin        maternal female  cousin; dx 71s   Breast cancer Half-Brother 31    ALLERGIES:  is allergic to ezetimibe, statins, alendronate sodium, ibandronate, and spiriva [tiotropium bromide].  MEDICATIONS:  Current Outpatient Medications  Medication Sig Dispense Refill   albuterol  (PROVENTIL ) (2.5 MG/3ML) 0.083% nebulizer solution TAKE 3 MLS BY NEBULIZER EVERY6 HOURS AS NEEDED FOR WHEEZING OR SHORTNESS OF BREATH 75 mL 6   albuterol  (VENTOLIN  HFA) 108 (90 Base) MCG/ACT inhaler Inhale 2 puffs into the lungs every 6 (six) hours as needed for wheezing or shortness of breath. 18 each 3   Ascorbic Acid (VITAMIN C PO) Take 1 tablet by mouth daily.     budeson-glycopyrrolate-formoterol  (BREZTRI  AEROSPHERE) 160-9-4.8 MCG/ACT AERO inhaler INHALE 2 INHALATIONS BY MOUTH  INTO THE LUNGS IN THE MORNING  AND AT BEDTIME 32.1 g 8   Calcium Carb-Cholecalciferol (CALCIUM 600 + D PO) Take 1 tablet by mouth every other day.       Cholecalciferol (VITAMIN D-3 PO) Take 1 capsule by mouth daily.     fluticasone (FLONASE) 50 MCG/ACT nasal spray Place 1 spray into both nostrils daily as needed for allergies or rhinitis.     hydrochlorothiazide  (HYDRODIURIL ) 25 MG tablet Take 25 mg by mouth daily as needed (swelling).     Multiple Vitamin (MULTIVITAMIN WITH MINERALS) TABS tablet Take 1 tablet by mouth daily.     predniSONE  (DELTASONE ) 10 MG tablet Please take prednisone  40 mg x1 day, then 30 mg x1 day, then 20 mg x1 day, then 10 mg x1 day, and then 5 mg x1 day and stop 10 tablet 1   No current facility-administered medications for this visit.     PHYSICAL EXAMINATION: ECOG PERFORMANCE STATUS: 0 - Asymptomatic  There were no vitals filed for this visit.      There were no vitals filed for this visit.     Physical Exam Constitutional:      Appearance: Normal appearance.  Chest:       Comments: Bilateral breast examined.  Area of discomfort noted in the right breast, likely a dense area. No definitive palpable masses. No regional adenopathy. Musculoskeletal:     Cervical back: Normal range of motion. No rigidity.  Lymphadenopathy:     Cervical: No cervical adenopathy.  Neurological:     Mental Status: She is alert.     LABORATORY DATA:  I have reviewed the data as listed Lab Results  Component Value Date   WBC 8.3 08/15/2022   HGB 14.3 08/15/2022   HCT 43.1 08/15/2022   MCV 89.9 08/15/2022   PLT 197.0 08/15/2022   Lab Results  Component Value Date   NA 141 11/28/2021   K 3.8 11/28/2021   CL 105 11/28/2021   CO2 27 11/28/2021    RADIOGRAPHIC STUDIES: I have personally reviewed the radiological reports and agreed with the findings in the report.  ASSESSMENT AND PLAN:   Malignant neoplasm of upper-outer quadrant of right breast in female, estrogen receptor positive (HCC) This is a very pleasant 88 year old female patient with past medical history significant for COPD, hypertension referred  to medical oncology for new diagnosis of right breast invasive ductal carcinoma noted on a screening mammogram.  She declined adjuvant radiation.  She tried tamoxifen , couldn't tolerate it, didn't want to continue it, hence she is on observation alone.  Assessment and Plan Assessment & Plan  History of breast cancer, on observation alone No concerns for recurrence Proceed with mammogram as scheduled.  Breast pain, improving Recent pain radiating to the right  breast is improving spontaneously. Absence of a palpable lump and improvement suggest a non-cancerous etiology, possibly muscle strain or fat necrosis. - Proceed with scheduled mammogram in December.  Hypertension Blood pressure readings at home are generally within normal range, but an elevated reading was noted during the visit, likely due to stress and discomfort from the machine. She was previously taken off antihypertensive medication due to hypotension. - Perform manual blood pressure measurement. - Monitor blood pressure regularly at home.  Fatty liver disease Diagnosed via ultrasound. She has been advised to reduce red meat intake and has successfully lost 40 pounds, which should positively impact liver health. - Continue dietary modifications to reduce red meat intake. - Maintain current weight loss.  Osteoporosis Managed with regular exercise and vitamin D supplementation. She maintains an active lifestyle, walking at least five days a week, which is beneficial for bone density. - Continue regular exercise regimen. - Continue vitamin D supplementation.   Total time spent: 30 minutes counseling about her options for antiestrogen therapy, mechanism of action and adverse effects  All questions were answered. The patient knows to call the clinic with any problems, questions or concerns.    Amber Stalls, MD 08/09/24

## 2024-08-11 NOTE — Progress Notes (Signed)
 Update: MSH2 p.S87C (c.260C>G) VUS reclassified to likely benign. Amended report date is 06/14/2024.

## 2024-08-31 ENCOUNTER — Ambulatory Visit: Admitting: Nurse Practitioner

## 2024-09-29 ENCOUNTER — Ambulatory Visit: Admitting: Internal Medicine

## 2024-09-29 ENCOUNTER — Encounter: Payer: Self-pay | Admitting: Internal Medicine

## 2024-09-29 VITALS — BP 140/84 | HR 65 | Temp 97.8°F | Ht 67.0 in | Wt 150.2 lb

## 2024-09-29 DIAGNOSIS — J449 Chronic obstructive pulmonary disease, unspecified: Secondary | ICD-10-CM | POA: Diagnosis not present

## 2024-09-29 NOTE — Progress Notes (Signed)
 OV 09/29/2024  Subjective:  Patient ID: Kelly Delgado Code, female , DOB: [birth year] , age 88 y.o. , MRN: [id number] , ADDRESS: [address] [place] [id number] PCP Katina Pfeiffer, PA-C Patient Care Team: Katina Pfeiffer, PA-C as PCP - General (Family Medicine) Geronimo Amel, MD as Consulting Physician (Pulmonary Disease)  This Provider for this visit: Treatment Team:  Attending Provider: Geronimo Amel, MD    09/29/2024 -   Chief Complaint  Patient presents with   COPD    Pt states since LOV breathing has been great      Gold stage II COPD on Symbicort   -Although March 2024 CT scan close normal.  Multiple COPD exacerbations  - eos 600 in oct 2023  ILA v Possible early ILD on CT scan of the chest early September September 2022 pre-COVID COVID  - Labor Day 2022 history of outpatient COVID  = not interestedin CT chest as of spring 2024   Borderlne positive RF - 47 in Oct 22 (other serology negative)  NEg Earleen Gold Oct 2022  HPI Kelly Delgado 88 y.o. -Kelly Delgado is an 88 year old female with COPD who presents for routine follow-up.  She has not experienced any hospitalizations or emergency room visits this year. During the summer, she had a cold and was prescribed prednisone .  She uses Breztri  primarily in the morning and occasionally forgets to use it at night. She feels it is effective and has enough supply to last until the end of the year. She also uses Flonase as needed, particularly after returning from a trip to [place]  in October when she caught a cold from her cousin. She experienced some nasal drainage but has not had any recent symptoms.  No current runny nose and minimal recent use of Flonase. She does not take the flu shot and prefers to avoid public places to minimize illness exposure.   SYMPTOM SCALE - 12/30/2022 11/17/2023   Current weight    O2 use ra   Shortness of Breath 0 -> 5 scale with 5 being worst (score 6 If  unable to do)   At rest 0   Simple tasks - showers, clothes change, eating, shaving 3 0  Household (dishes, doing bed, laundry) 4 0  Shopping 4 1  Walking level at own pace 2 0  Walking up Stairs 4 1  Total (30-36) Dyspnea Score 15 2  How bad is your cough? 0 0  How bad is your fatigue 2 0  How bad is nausea 0 00  How bad is vomiting?  0 0  How bad is diarrhea? 0 0  How bad is anxiety? 0   How bad is depression 0 0  Any chronic pain - if so where and how bad 0 0    PFT     Latest Ref Rng & Units 12/30/2022    2:26 PM 08/15/2022   11:55 AM 09/12/2021   10:53 AM 07/23/2015    9:49 AM  PFT Results  FVC-Pre L 2.18  2.11  1.92  2.51   FVC-Predicted Pre % 77  75  64  77   FVC-Post L  2.20  2.28  2.55   FVC-Predicted Post %  78  76  79   Pre FEV1/FVC % % 77  72  68  74   Post FEV1/FCV % %  78  75  76   FEV1-Pre L 1.68  1.52  1.31  1.85   FEV1-Predicted  Pre % 80  72  58  76   FEV1-Post L  1.71  1.71  1.93   DLCO uncorrected ml/min/mmHg 16.94  15.30  17.02    DLCO UNC% % 82  74  80    DLCO corrected ml/min/mmHg 16.94  15.30  16.92    DLCO COR %Predicted % 82  74  79    DLVA Predicted % 109  96  122    TLC L  4.62  4.51    TLC % Predicted %  83  79    RV % Predicted %  97  100         LAB RESULTS last 96 hours No results found.       has a past medical history of Anxiety, Asthmatic bronchitis, Basal cell carcinoma of breast (1990's?), Breast cancer (HCC) (11/05/2021), COPD (chronic obstructive pulmonary disease) (HCC), GERD (gastroesophageal reflux disease), H/O hiatal hernia, Hypertension, 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   reports that she quit smoking about 55 years ago. Her smoking use included cigarettes. She started smoking about 68 years ago. She has a 4.3 pack-year smoking history. She has never used smokeless tobacco.  Past Surgical History:  Procedure Laterality Date    ABDOMINAL HYSTERECTOMY  06/28/1979   BREAST BIOPSY Right 11/05/2021   BREAST LUMPECTOMY WITH RADIOACTIVE SEED LOCALIZATION Right 12/05/2021   Procedure: RIGHT BREAST LUMPECTOMY WITH RADIOACTIVE SEED LOCALIZATION;  Surgeon: Belinda Cough, MD;  Location: [facility] OR;  Service: General;  Laterality: Right;   CATARACT EXTRACTION W/ INTRAOCULAR LENS  IMPLANT, BILATERAL Bilateral 06/27/1989   DILATION AND CURETTAGE OF UTERUS  06/28/1979   1 (06/07/2013)   ESOPHAGEAL DILATION  10/27/2010   just once (06/07/2013)   HIP PINNING,CANNULATED Right 10/09/2014   Procedure: CANNULATED HIP PINNING;  Surgeon: Evalene JONETTA Chancy, MD;  Location: [facility] OR;  Service: Orthopedics;  Laterality: Right;   URETHRAL DIVERTICULUM REPAIR  06/27/1969    Allergies  Allergen Reactions   Ezetimibe Shortness Of Breath   Statins Other (See Comments)    Increased LFT's   Alendronate Sodium     felt sick   Ibandronate     felt sick   Spiriva [Tiotropium Bromide] Itching    Immunization History  Administered Date(s) Administered   Fluzone Influenza virus vaccine,trivalent (IIV3), split virus 09/05/2010, 07/17/2014, 07/23/2015   INFLUENZA, HIGH DOSE SEASONAL PF 08/25/2012, 08/11/2016, 08/14/2017   Influenza Split 06/26/2009, 08/14/2017   Influenza Whole 06/27/2012   Influenza,inj,Quad PF,6+ Mos 07/17/2014, 07/23/2015, 07/27/2016, 11/01/2018   Pneumococcal Conjugate-13 07/17/2014   Pneumococcal Polysaccharide-23 12/12/2008, 06/27/2012   Pneumococcal-Unspecified 06/27/2012   Tdap 06/29/2006, 02/07/2015    Family History  Problem Relation Age of Onset   Cancer Mother    Stroke Mother    Hypertension Mother    Prostate cancer Father    Cancer Maternal Aunt        unknown type; dx after 67   Leukemia Maternal Uncle    Breast cancer Cousin        maternal female cousin; dx 50s   Breast cancer Cousin        maternal female cousin; dx 25s   Breast cancer Half-Brother 42     Current Outpatient Medications:     albuterol  (VENTOLIN  HFA) 108 (90 Base) MCG/ACT inhaler, Inhale 2 puffs into the lungs every 6 (six) hours as needed for wheezing or shortness of breath., Disp: 18 each, Rfl: 3   Ascorbic Acid (  VITAMIN C PO), Take 1 tablet by mouth daily., Disp: , Rfl:    budeson-glycopyrrolate-formoterol  (BREZTRI  AEROSPHERE) 160-9-4.8 MCG/ACT AERO inhaler, INHALE 2 INHALATIONS BY MOUTH  INTO THE LUNGS IN THE MORNING  AND AT BEDTIME, Disp: 32.1 g, Rfl: 8   Calcium Carb-Cholecalciferol (CALCIUM 600 + D PO), Take 1 tablet by mouth every other day. , Disp: , Rfl:    Cholecalciferol (VITAMIN D-3 PO), Take 1 capsule by mouth daily., Disp: , Rfl:    fluticasone (FLONASE) 50 MCG/ACT nasal spray, Place 1 spray into both nostrils daily as needed for allergies or rhinitis., Disp: , Rfl:    hydrochlorothiazide  (HYDRODIURIL ) 25 MG tablet, Take 25 mg by mouth daily as needed (swelling)., Disp: , Rfl:    Multiple Vitamin (MULTIVITAMIN WITH MINERALS) TABS tablet, Take 1 tablet by mouth daily., Disp: , Rfl:    albuterol  (PROVENTIL ) (2.5 MG/3ML) 0.083% nebulizer solution, TAKE 3 MLS BY NEBULIZER EVERY6 HOURS AS NEEDED FOR WHEEZING OR SHORTNESS OF BREATH (Patient not taking: Reported on 09/29/2024), Disp: 75 mL, Rfl: 6   predniSONE  (DELTASONE ) 10 MG tablet, Please take prednisone  40 mg x1 day, then 30 mg x1 day, then 20 mg x1 day, then 10 mg x1 day, and then 5 mg x1 day and stop, Disp: 10 tablet, Rfl: 1      Objective:   Vitals:   09/29/24 1402  BP: (!) 140/84  Pulse: 65  Temp: 97.8 F (36.6 C)  TempSrc: Oral  SpO2: 99%  Weight: 150 lb 3.2 oz (68.1 kg)  Height: 5' 7 (1.702 m)    Estimated body mass index is 23.52 kg/m as calculated from the following:   Height as of this encounter: 5' 7 (1.702 m).   Weight as of this encounter: 150 lb 3.2 oz (68.1 kg).  @WEIGHTCHANGE @  [employer]   09/29/24 1402  Weight: 150 lb 3.2 oz (68.1 kg)     Physical Exam   General: No distress. Looks well O2 at rest: no Cane  present: no Sitting in wheel chair: no Frail: no Obese: no Neuro: Alert and Oriented x 3. GCS 15. Speech normal Psych: Pleasant Resp:  Barrel Chest - no.  Wheeze - no, Crackles - no, No overt respiratory distress CVS: Normal heart sounds. Murmurs - no Ext: Stigmata of Connective Tissue Disease - no HEENT: Normal upper airway. PEERL +. No post nasal drip        Assessment/     Assessment & Plan Stage 2 moderate COPD by GOLD classification (HCC)    PLAN Patient Instructions  Stage 2 moderate COPD by GOLD classification (HCC) Lookksw ell  - current stabl diseae -- But glad no flare up snce summer 2023 except once in summer 2025 with BREZTRI   Plan  -  continue breztril scheduled with albuterol  as need  - ok to do breztril once a  day if that is working well for you - respect decline flu shot - do spirometry and dlco in 9 months    Followup -9 months or sooner if needed    FOLLOWUP    Return in about 9 months (around 06/30/2025) for 15 min visit, with any of the APPS, with Dr Geronimo, Face to Face Visit.    SIGNATURE    Dr. Dorethia Geronimo, M.D., F.C.C.P,  Pulmonary and Critical Care Medicine Staff Physician, [facility] Center Director - Interstitial Lung Disease  Program  Pulmonary Fibrosis [facility] at [facility] [place], [place], [address]  Pager: [phone]  [phone], If no answer or between  15:00h - 7:00h: call [phone] Telephone: [phone]  2:38 PM 09/29/2024

## 2024-09-29 NOTE — Patient Instructions (Addendum)
 Stage 2 moderate COPD by GOLD classification (HCC) Lookksw ell  - current stabl diseae -- But glad no flare up snce summer 2023 except once in summer 2025 with BREZTRI   Plan  -  continue breztril scheduled with albuterol  as need  - ok to do breztril once a  day if that is working well for you - respect decline flu shot - do spirometry and dlco in 9 months    Followup -9 months or sooner if needed

## 2024-10-10 ENCOUNTER — Ambulatory Visit
Admission: RE | Admit: 2024-10-10 | Discharge: 2024-10-10 | Disposition: A | Source: Ambulatory Visit | Attending: Hematology and Oncology

## 2024-10-10 DIAGNOSIS — Z170 Estrogen receptor positive status [ER+]: Secondary | ICD-10-CM

## 2025-02-07 ENCOUNTER — Inpatient Hospital Stay: Admitting: Hematology and Oncology
